# Patient Record
Sex: Male | Born: 1965 | Race: Black or African American | Hispanic: No | Marital: Single | Smoking: Never smoker
Health system: Southern US, Community
[De-identification: ages and names within clinical notes are randomized; demographics above are authoritative.]

## PROBLEM LIST (undated history)

## (undated) DIAGNOSIS — F09 Unspecified mental disorder due to known physiological condition: Secondary | ICD-10-CM

## (undated) DIAGNOSIS — M24159 Other articular cartilage disorders, unspecified hip: Secondary | ICD-10-CM

## (undated) DIAGNOSIS — I10 Essential (primary) hypertension: Secondary | ICD-10-CM

## (undated) DIAGNOSIS — E1165 Type 2 diabetes mellitus with hyperglycemia: Secondary | ICD-10-CM

## (undated) DIAGNOSIS — E785 Hyperlipidemia, unspecified: Secondary | ICD-10-CM

## (undated) DIAGNOSIS — M79604 Pain in right leg: Secondary | ICD-10-CM

## (undated) DIAGNOSIS — I6782 Cerebral ischemia: Secondary | ICD-10-CM

## (undated) DIAGNOSIS — M4316 Spondylolisthesis, lumbar region: Secondary | ICD-10-CM

## (undated) DIAGNOSIS — E871 Hypo-osmolality and hyponatremia: Secondary | ICD-10-CM

## (undated) DIAGNOSIS — R5383 Other fatigue: Secondary | ICD-10-CM

## (undated) DIAGNOSIS — Q659 Congenital deformity of hip, unspecified: Secondary | ICD-10-CM

## (undated) DIAGNOSIS — E119 Type 2 diabetes mellitus without complications: Secondary | ICD-10-CM

## (undated) DIAGNOSIS — D649 Anemia, unspecified: Secondary | ICD-10-CM

## (undated) DIAGNOSIS — R296 Repeated falls: Secondary | ICD-10-CM

## (undated) DIAGNOSIS — M5030 Other cervical disc degeneration, unspecified cervical region: Secondary | ICD-10-CM

## (undated) DIAGNOSIS — M25562 Pain in left knee: Secondary | ICD-10-CM

## (undated) HISTORY — DX: Type 2 diabetes mellitus with hyperglycemia: E11.65

## (undated) HISTORY — DX: Hypo-osmolality and hyponatremia: E87.1

## (undated) HISTORY — DX: Spondylolisthesis, lumbar region: M43.16

## (undated) HISTORY — DX: Repeated falls: R29.6

## (undated) HISTORY — DX: Pain in right leg: M79.604

## (undated) HISTORY — DX: Other cervical disc degeneration, unspecified cervical region: M50.30

## (undated) HISTORY — DX: Anemia, unspecified: D64.9

## (undated) HISTORY — DX: Type 2 diabetes mellitus without complications: E11.9

## (undated) HISTORY — DX: Pain in left knee: M25.562

## (undated) HISTORY — DX: Congenital deformity of hip, unspecified: Q65.9

## (undated) HISTORY — DX: Unspecified mental disorder due to known physiological condition: F09

## (undated) HISTORY — DX: Other fatigue: R53.83

## (undated) HISTORY — DX: Cerebral ischemia: I67.82

## (undated) HISTORY — DX: Other articular cartilage disorders, unspecified hip: M24.159

## (undated) HISTORY — DX: Essential (primary) hypertension: I10

## (undated) HISTORY — DX: Hyperlipidemia, unspecified: E78.5

## (undated) HISTORY — DX: Hypocalcemia: E83.51

---
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
## 1997-10-26 ENCOUNTER — Ambulatory Visit (HOSPITAL_COMMUNITY): Admission: RE | Admit: 1997-10-26 | Discharge: 1997-10-26 | Payer: Self-pay | Admitting: Family Medicine

## 1997-10-29 ENCOUNTER — Ambulatory Visit (HOSPITAL_COMMUNITY): Admission: RE | Admit: 1997-10-29 | Discharge: 1997-10-29 | Payer: Self-pay | Admitting: Family Medicine

## 2009-03-26 ENCOUNTER — Emergency Department (HOSPITAL_COMMUNITY): Admission: EM | Admit: 2009-03-26 | Discharge: 2009-03-26 | Payer: Self-pay | Admitting: Family Medicine

## 2012-04-14 ENCOUNTER — Encounter: Payer: Self-pay | Admitting: Family Medicine

## 2012-04-14 ENCOUNTER — Ambulatory Visit (INDEPENDENT_AMBULATORY_CARE_PROVIDER_SITE_OTHER): Payer: 59 | Admitting: Family Medicine

## 2012-04-14 VITALS — BP 154/105 | HR 125 | Temp 98.3°F | Resp 20 | Ht 67.75 in | Wt 213.6 lb

## 2012-04-14 DIAGNOSIS — E119 Type 2 diabetes mellitus without complications: Secondary | ICD-10-CM

## 2012-04-14 DIAGNOSIS — J322 Chronic ethmoidal sinusitis: Secondary | ICD-10-CM

## 2012-04-14 DIAGNOSIS — I10 Essential (primary) hypertension: Secondary | ICD-10-CM

## 2012-04-14 DIAGNOSIS — J209 Acute bronchitis, unspecified: Secondary | ICD-10-CM

## 2012-04-14 LAB — POCT CBC
Granulocyte percent: 46.7 %G (ref 37–80)
HCT, POC: 43 % — AB (ref 43.5–53.7)
MCH, POC: 26.7 pg — AB (ref 27–31.2)
MCHC: 31.6 g/dL — AB (ref 31.8–35.4)
MCV: 84.3 fL (ref 80–97)
MID (cbc): 0.5 (ref 0–0.9)
POC Granulocyte: 3 (ref 2–6.9)
POC LYMPH PERCENT: 46.2 %L (ref 10–50)

## 2012-04-14 MED ORDER — METOPROLOL SUCCINATE ER 50 MG PO TB24: ORAL_TABLET | ORAL | Status: DC

## 2012-04-14 MED ORDER — AMOXICILLIN 500 MG PO CAPS: 1000.0000 mg | ORAL_CAPSULE | Freq: Two times a day (BID) | ORAL | Status: DC

## 2012-04-14 MED ORDER — METFORMIN HCL 500 MG PO TABS: 500.0000 mg | ORAL_TABLET | Freq: Two times a day (BID) | ORAL | Status: DC

## 2012-04-14 NOTE — Progress Notes (Signed)
Subjective:    Patient ID: Shawn Austin, male    DOB: 11/23/1965, 47 y.o.   MRN: [id number]  HPI This 47 y.o. male presents for evaluation of the following:  1.  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  Ate peppermint and chewing gum today; no food; drank water.  Rare desserts. Does drink a lot of regular sodas; sweet tea is common and regular.  90% of most of fluid is sweet.  Fruit juice.  Putting together gym in garage.  Absolutely does not want to take medication; started back walking.  Started suspecting sugar was elevated due to nocturia.  Nocturia x 3.  Plays golf.  B:  [employer] eat chicken fila biscuit and steak biscuit, fries, sweet tea or lemonade.  Snack: banana, granola bar, applesauce.  Boost high protein.   Lunch:  Chicken, fries, sweet tea.  Snack:  None.  Supper:  Popcorn with son.  Green beans.  Wants prime rib at [employer].  No family history of DMII.  2.  HTN:  No previous diagnosis.  Denies chest pain, palpitations, shortness of breath, leg swelling. +HA with sinus infection; no dizziness, blurred vision, focal weakness, paresthesias.  Very resistant to take medication.    3.  Sinus congestion:  Onset two weeks ago; son had a cold; +fever with onset.  +ear itching.  No ear pain; no sore throat.  +rhinorrhea; +nasal congestion clear.  +PND a lot but improved.  No coughing.  +hoarseness; +gland swollen.  Taking nothing for symptoms.  Bought saline solution nasal spray PRN.  Had a headache from nasal spray so stopped it.  Bloody drainage in mornings.   Review of Systems  Constitutional: Negative for fever, chills, diaphoresis and fatigue.  HENT: Positive for congestion, rhinorrhea, postnasal drip and sinus pressure. Negative for ear pain, sore throat, trouble swallowing and voice change.   Respiratory: Negative for cough and shortness of breath.   Cardiovascular: Negative for chest pain and palpitations.   Gastrointestinal: Negative for nausea, vomiting, abdominal pain and diarrhea.  Endocrine: Positive for polyuria. Negative for cold intolerance, heat intolerance, polydipsia and polyphagia.  Skin: Negative for color change, pallor, rash and wound.  Neurological: Positive for headaches. Negative for dizziness, tremors, seizures, syncope, facial asymmetry, speech difficulty, weakness, light-headedness and numbness.        History reviewed. No pertinent past medical history.  History reviewed. No pertinent past surgical history.  Prior to Admission medications   Medication Sig Start Date End Date Taking? Authorizing Provider  AMINO ACIDS COMPLEX PO Take by mouth.   Yes Historical Provider, MD  CINNAMON PO Take by mouth.   Yes Historical Provider, MD  GARLIC 1500 PO Take by mouth.   Yes Historical Provider, MD  amoxicillin (AMOXIL) 500 MG capsule Take 2 capsules (1,000 mg total) by mouth 2 (two) times daily. 04/14/12   Ethelda Chick, MD  metFORMIN (GLUCOPHAGE) 500 MG tablet Take 1 tablet (500 mg total) by mouth 2 (two) times daily with a meal. 04/14/12   Ethelda Chick, MD  metoprolol succinate (TOPROL-XL) 50 MG 24 hr tablet 1/2 tablet daily for one week; increase to 1 tablet daily.  Take with or immediately following a meal. 04/14/12   Ethelda Chick, MD    No Known Allergies  History   Social History  . Marital Status: Single    Spouse Name: N/A    Number of Children: N/A  .  Years of Education: N/A   Occupational History  . Not on file.   Social History Main Topics  . Smoking status: Never Smoker   . Smokeless tobacco: Not on file  . Alcohol Use: No  . Drug Use: No  . Sexually Active: Not on file   Other Topics Concern  . Not on file   Social History Narrative  . No narrative on file    No family history on file.  Objective:   Physical Exam  Nursing note and vitals reviewed. Constitutional: He is oriented to person, place, and time. He appears well-developed and  well-nourished. No distress.  HENT:  Head: Normocephalic and atraumatic.  Right Ear: External ear normal.  Left Ear: External ear normal.  Nose: Mucosal edema and rhinorrhea present. Right sinus exhibits maxillary sinus tenderness and frontal sinus tenderness. Left sinus exhibits maxillary sinus tenderness and frontal sinus tenderness.  Mouth/Throat: Oropharynx is clear and moist.  Eyes: Conjunctivae and EOM are normal. Pupils are equal, round, and reactive to light.  Neck: Normal range of motion. Neck supple. No thyromegaly present.  Cardiovascular: Normal rate, regular rhythm, normal heart sounds and intact distal pulses.  Exam reveals no gallop and no friction rub.   No murmur heard. Pulmonary/Chest: Effort normal and breath sounds normal. He has no wheezes. He has no rales.  Abdominal: Soft. Bowel sounds are normal. He exhibits no distension. There is no tenderness. There is no rebound and no guarding. Hernia confirmed negative in the right inguinal area and confirmed negative in the left inguinal area.  Genitourinary: Penis normal. Right testis shows no mass, no swelling and no tenderness. Left testis shows no mass, no swelling and no tenderness.  Lymphadenopathy:    He has cervical adenopathy.       Right: No inguinal adenopathy present.       Left: No inguinal adenopathy present.  Neurological: He is alert and oriented to person, place, and time. He has normal reflexes. No cranial nerve deficit. He exhibits normal muscle tone. Coordination normal.  Skin: Skin is warm and dry. No rash noted. He is not diaphoretic. No erythema. No pallor.  Psychiatric: He has a normal mood and affect. His behavior is normal. Judgment and thought content normal.   Results for orders placed in visit on 04/14/12  POCT CBC      Result Value Range   WBC 6.5  4.6 - 10.2 K/uL   Lymph, poc 3.0  0.6 - 3.4   POC LYMPH PERCENT 46.2  10 - 50 %L   MID (cbc) 0.5  0 - 0.9   POC MID % 7.1  0 - 12 %M   POC  Granulocyte 3.0  2 - 6.9   Granulocyte percent 46.7  37 - 80 %G   RBC 5.10  4.69 - 6.13 M/uL   Hemoglobin 13.6 (*) 14.1 - 18.1 g/dL   HCT, POC 19.1 (*) 47.8 - 53.7 %   MCV 84.3  80 - 97 fL   MCH, POC 26.7 (*) 27 - 31.2 pg   MCHC 31.6 (*) 31.8 - 35.4 g/dL   RDW, POC 29.5     Platelet Count, POC 466 (*) 142 - 424 K/uL   MPV 8.5  0 - 99.8 fL       Assessment & Plan:  Diabetes mellitus - Plan: Lipid panel  Essential hypertension, benign - Plan: Comprehensive metabolic panel, TSH  Ethmoid sinusitis - Plan: POCT CBC    1. DMII: New onset; rx for  Metformin 500mg  bid provided; dietary changes reviewed in great detail; recommend weight loss, exercise, low-carb food choices.  Refer for diabetic education.  Follow-up in one month; provided with three month DOT card. 2.  HTN:  New onset; rx for Metoprolol ER 50mg  one daily. Obtain labs.  F/u one month for follow-up and EKG.  Provided with three month DOT card. 3.  Acute Sinusitis:  New. Rx for Amoxicillin provided.     Meds ordered this encounter  Medications  . AMINO ACIDS COMPLEX PO    Sig: Take by mouth.  Marland Kitchen CINNAMON PO    Sig: Take by mouth.  Marland Kitchen GARLIC 1500 PO    Sig: Take by mouth.  . metFORMIN (GLUCOPHAGE) 500 MG tablet    Sig: Take 1 tablet (500 mg total) by mouth 2 (two) times daily with a meal.    Dispense:  60 tablet    Refill:  3  . metoprolol succinate (TOPROL-XL) 50 MG 24 hr tablet    Sig: 1/2 tablet daily for one week; increase to 1 tablet daily.  Take with or immediately following a meal.    Dispense:  30 tablet    Refill:  3  . amoxicillin (AMOXIL) 500 MG capsule    Sig: Take 2 capsules (1,000 mg total) by mouth 2 (two) times daily.    Dispense:  40 capsule    Refill:  0

## 2012-04-14 NOTE — Patient Instructions (Addendum)
Diabetes mellitus - Plan: Lipid panel  Essential hypertension, benign - Plan: Comprehensive metabolic panel, TSH  Ethmoid sinusitis - Plan: POCT CBC   1.  Follow-up in one month on Thursday night with Dr. Nilda Simmer 2.  AVOID ALL SWEETENED BEVERAGES SUCH AS SWEET TEA, FRUIT JUICE, REGULAR SODAS. 3.  EAT PROTEIN WITH EACH MEAL OR SNACK.  PROTEIN SOURCES:  MEATS, CHEESE, NUTS. 4.  EXERCISE FOUR DAYS PER WEEK. 5.  TAKE METFORMIN TWICE DAILY WITH MEALS. 6.  TAKE METOPROLOL AT NIGHTTIME.

## 2012-04-15 LAB — COMPREHENSIVE METABOLIC PANEL
ALT: 16 U/L (ref 0–53)
BUN: 8 mg/dL (ref 6–23)
CO2: 26 mEq/L (ref 19–32)
Calcium: 9.9 mg/dL (ref 8.4–10.5)
Chloride: 100 mEq/L (ref 96–112)
Creat: 0.8 mg/dL (ref 0.50–1.35)
Glucose, Bld: 269 mg/dL — ABNORMAL HIGH (ref 70–99)
Total Bilirubin: 0.6 mg/dL (ref 0.3–1.2)

## 2012-04-15 LAB — LIPID PANEL
Cholesterol: 200 mg/dL (ref 0–200)
HDL: 42 mg/dL (ref >39)
Total CHOL/HDL Ratio: 4.8 Ratio
Triglycerides: 195 mg/dL — ABNORMAL HIGH (ref <150)

## 2012-04-15 LAB — TSH: TSH: 0.984 u[IU]/mL (ref 0.350–4.500)

## 2012-05-11 ENCOUNTER — Ambulatory Visit: Payer: Self-pay | Admitting: Dietician

## 2012-05-19 ENCOUNTER — Ambulatory Visit (INDEPENDENT_AMBULATORY_CARE_PROVIDER_SITE_OTHER): Payer: 59 | Admitting: Family Medicine

## 2012-05-19 VITALS — BP 149/100 | HR 103 | Temp 98.2°F | Resp 16 | Ht 68.0 in | Wt 209.0 lb

## 2012-05-19 DIAGNOSIS — K429 Umbilical hernia without obstruction or gangrene: Secondary | ICD-10-CM

## 2012-05-19 DIAGNOSIS — D649 Anemia, unspecified: Secondary | ICD-10-CM

## 2012-05-19 DIAGNOSIS — E119 Type 2 diabetes mellitus without complications: Secondary | ICD-10-CM

## 2012-05-19 DIAGNOSIS — IMO0001 Reserved for inherently not codable concepts without codable children: Secondary | ICD-10-CM

## 2012-05-19 DIAGNOSIS — I10 Essential (primary) hypertension: Secondary | ICD-10-CM

## 2012-05-19 LAB — POCT CBC
HCT, POC: 41 % — AB (ref 43.5–53.7)
Lymph, poc: 3.3 (ref 0.6–3.4)
MCHC: 32 g/dL (ref 31.8–35.4)
MID (cbc): 0.4 (ref 0–0.9)
POC Granulocyte: 2.3 (ref 2–6.9)
POC LYMPH PERCENT: 54.9 %L — AB (ref 10–50)
POC MID %: 7.4 %M (ref 0–12)
RDW, POC: 13.1 %

## 2012-05-19 NOTE — Patient Instructions (Addendum)
Type II or unspecified type diabetes mellitus without mention of complication, uncontrolled - Plan: POCT glucose (manual entry), POCT urinalysis dipstick, Comprehensive metabolic panel  Essential hypertension, benign - Plan: POCT CBC, EKG 12-Lead  Umbilical hernia

## 2012-05-19 NOTE — Progress Notes (Signed)
[address]   [place], [place]  [address]   [phone]  Subjective:    Patient ID: Shawn Austin, male    DOB: [birth year], 47 y.o.   MRN: [id number]  HPI This 47 y.o. male presents for one month follow-up:  1.  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   Did not take Metoprolol but 2 days; started walking 2-3 miles daily since last visit.  Four pound weight loss.  Feeling well. Denies HA, focal weakness, dizziness, chest pain, palpitations, SOB, leg swelling.    2.  DMII: took Metformin for two days; caused abdominal pain and diarrhea.  Metformin is not going to work for patient.  Metformin made pt feel badly.  Four pound weight loss since last visit.  Cut out the sweet tea but had to gradually cut it out.  Employer took out of work last week due to elevated blood sugars and blood pressure.  3.  Umbilical Hernia:  Needs statement saying that patient has not restrictions.     Review of Systems  Constitutional: Negative for fever, chills, diaphoresis and fatigue.  Respiratory: Negative for shortness of breath.   Cardiovascular: Negative for chest pain, palpitations and leg swelling.  Gastrointestinal: Negative for nausea, vomiting, abdominal pain, diarrhea and constipation.  Neurological: Negative for dizziness, tremors, seizures, syncope, facial asymmetry, speech difficulty, weakness, light-headedness, numbness and headaches.    History reviewed. No pertinent past medical history.  History reviewed. No pertinent past surgical history.  Prior to Admission medications   Medication Sig Start Date End Date Taking? Authorizing Provider  AMINO ACIDS COMPLEX PO Take by mouth.    Historical Provider, MD  CINNAMON PO Take by mouth.    Historical Provider, MD  GARLIC 1500 PO Take by mouth.    Historical Provider, MD  metFORMIN (GLUCOPHAGE) 500 MG tablet Take 1  tablet (500 mg total) by mouth 2 (two) times daily with a meal. 04/14/12   Ethelda Chick, MD  metoprolol succinate (TOPROL-XL) 50 MG 24 hr tablet 1/2 tablet daily for one week; increase to 1 tablet daily.  Take with or immediately following a meal. 04/14/12   Ethelda Chick, MD    No Known Allergies  History   Social History  . Marital Status: Single    Spouse Name: N/A    Number of Children: N/A  . Years of Education: N/A   Occupational History  . Not on file.   Social History Main Topics  . Smoking status: Never Smoker   . Smokeless tobacco: Not on file  . Alcohol Use: No  . Drug Use: No  . Sexually Active: Not Currently   Other Topics Concern  . Not on file   Social History Narrative  . No narrative on file    History reviewed. No pertinent family history.     Objective:   Physical Exam  Nursing note and vitals reviewed. Constitutional: He is oriented to person, place, and time. He appears well-developed and well-nourished. No distress.  Eyes: Conjunctivae are normal.  Neck: Normal range of motion. Neck supple. No thyromegaly present.  Cardiovascular: Normal rate, regular rhythm and normal heart sounds.  Exam reveals no friction rub.   No murmur heard. Pulmonary/Chest: Effort normal and breath sounds normal. He has no wheezes. He has no rales.  Abdominal: Soft. Bowel sounds are normal. A hernia is present.  Umbilical  hernia moderate in size.  Neurological: He is alert and oriented to person, place, and time.  Skin: He is not diaphoretic.    REPEAT BP IN OFFICE:  160/108      Results for orders placed in visit on 05/19/12  GLUCOSE, POCT (MANUAL RESULT ENTRY)      Result Value Range   POC Glucose 152 (*) 70 - 99 mg/dl  POCT CBC      Result Value Range   WBC 6.0  4.6 - 10.2 K/uL   Lymph, poc 3.3  0.6 - 3.4   POC LYMPH PERCENT 54.9 (*) 10 - 50 %L   MID (cbc) 0.4  0 - 0.9   POC MID % 7.4  0 - 12 %M   POC Granulocyte 2.3  2 - 6.9   Granulocyte percent 37.7   37 - 80 %G   RBC 4.92  4.69 - 6.13 M/uL   Hemoglobin 13.1 (*) 14.1 - 18.1 g/dL   HCT, POC 16.1 (*) 09.6 - 53.7 %   MCV 83.3  80 - 97 fL   MCH, POC 26.6 (*) 27 - 31.2 pg   MCHC 32.0  31.8 - 35.4 g/dL   RDW, POC 04.5     Platelet Count, POC 415  142 - 424 K/uL   MPV 8.3  0 - 99.8 fL  EKG 12-LEAD      Result Value Range   P001 Ascaris        Assessment & Plan:  Type II or unspecified type diabetes mellitus without mention of complication, uncontrolled - Plan: POCT glucose (manual entry), Comprehensive metabolic panel, CANCELED: POCT urinalysis dipstick  Essential hypertension, benign - Plan: POCT CBC, EKG 12-Lead  Umbilical hernia   1.  DMII: improved with current blood sugar of 158.  Non-compliant with Metformin but dietary modification with significant improvement in sugars.  Currently has three month DOT card. 2.  HTN: uncontrolled yet appears to have white coat syndrome component. Diastolic readings at home still elevated.  Refuses to take Metoprolol at this time. Weight down four pounds; has started exercising daily.  Desires close follow-up; will also provide information to employer that cleared to work.  Currently has three month card DOT. 3. Umbilical Hernia: small to moderate in size; asymptomatic; cleared to work. 4. Anemia: mild and stable.

## 2012-05-20 LAB — COMPREHENSIVE METABOLIC PANEL
Alkaline Phosphatase: 90 U/L (ref 39–117)
Glucose, Bld: 155 mg/dL — ABNORMAL HIGH (ref 70–99)
Sodium: 136 mEq/L (ref 135–145)
Total Bilirubin: 1 mg/dL (ref 0.3–1.2)
Total Protein: 7.6 g/dL (ref 6.0–8.3)

## 2012-05-20 NOTE — Progress Notes (Signed)
Appt made for 4/23 at 4:30 with Dr. Katrinka Blazing. Left vmail for pt with appt info.

## 2012-05-24 ENCOUNTER — Telehealth: Payer: Self-pay

## 2012-05-24 NOTE — Telephone Encounter (Signed)
PATIENT WANTS TO ASK DR. Katrinka Blazing IF THE PAPERS HE DROPPED OFF TO HER ON THURS. HAS BEEN FAXED TO HIS EMPLOYER'S MEDICAL DEPT? THEY SAID THEY HAVE NOT RECEIVED ANYTHING FROM [place]. BEST PHONE [phone] (CELL)  HE SAID THE FAX NUMBER WAS WITH THE PAPER WORK.  MBC

## 2012-05-26 ENCOUNTER — Telehealth: Payer: Self-pay

## 2012-05-26 NOTE — Telephone Encounter (Signed)
PATIENT WANTS DR. Katrinka Blazing TO KNOW THAT HE IS COMING BY TONIGHT TO ASK HER TO FILL OUT HIS WORK RESTRICTION FOR HIS JOB. SAYING THAT HE IS ABLE TO WORK. HE SAYS THIS WAS SUPPOSED TO BE DONE OVER A WEEK AGO. NOT SURE WHAT HE MEANS BY MEDICAL FORMS THAT NEEDED TO BE FILLED OUT IN OUR OFFICE. FOR QUESTIONS CALL: [phone]

## 2012-05-27 NOTE — Telephone Encounter (Signed)
Patient advised left voice mail

## 2012-05-27 NOTE — Telephone Encounter (Signed)
Forms completed and faxed with two recent office visits to case coordinator and [facility].

## 2012-05-27 NOTE — Telephone Encounter (Signed)
Please call pt --- I have faxed medical records to his Case Coordinator and also to [facility].  I will leave a copy of his records at the front desk if he would like to pick a copy up for his personal file.

## 2012-05-27 NOTE — Telephone Encounter (Signed)
Did you get forms?

## 2012-06-08 ENCOUNTER — Encounter: Payer: Self-pay | Admitting: Family Medicine

## 2012-06-08 ENCOUNTER — Ambulatory Visit (INDEPENDENT_AMBULATORY_CARE_PROVIDER_SITE_OTHER): Payer: 59 | Admitting: Family Medicine

## 2012-06-08 VITALS — BP 139/96 | HR 84 | Temp 98.7°F | Resp 18 | Wt 211.0 lb

## 2012-06-08 DIAGNOSIS — K429 Umbilical hernia without obstruction or gangrene: Secondary | ICD-10-CM

## 2012-06-08 DIAGNOSIS — E119 Type 2 diabetes mellitus without complications: Secondary | ICD-10-CM

## 2012-06-08 DIAGNOSIS — I10 Essential (primary) hypertension: Secondary | ICD-10-CM

## 2012-06-08 LAB — POCT URINALYSIS DIPSTICK
Bilirubin, UA: NEGATIVE
Glucose, UA: NEGATIVE
Leukocytes, UA: NEGATIVE
Nitrite, UA: NEGATIVE

## 2012-06-08 LAB — GLUCOSE, POCT (MANUAL RESULT ENTRY): POC Glucose: 214 mg/dl — AB (ref 70–99)

## 2012-06-08 NOTE — Patient Instructions (Addendum)
1. RETURN ON Jun 27, 2012 AT 4:30.

## 2012-06-08 NOTE — Progress Notes (Signed)
[address]   [place], [place]  [address]   [phone]  Subjective:    Patient ID: Shawn Austin, male    DOB: [birth year], 47 y.o.   MRN: [id number]  HPI This 47 y.o. male presents for three week follow-up/evaluation of the following:  1.  HTN:  Three week follow-up for elevated blood pressures.  Started Metoprolol one week ago; no side effects; taking Metoprolol ER 50mg  1/2 tablet daily.  Since starting Metoprolol, still having BP 115-130/80-96.  Walking 3 miles per day.  Diastolics lower in evenings.  In mornings, BP in 90-100s.  Taking Metoprolol during the daytime.  Denies chest pain, palpitations, shortness of breath, leg swelling.  No headaches, vision changes, dizziness, numbness, or tingling; no weakness.  Energy level is good.  2.  DMII:  Three week follow-up; no changes to management at last visit.  Referred to diabetic education/nutrition consultation; unable to attend; no glucometer at this time.  Only eating greens/vegetables, low-carb food choices.. No sweet tea. B:  Yogurt, almonds, supplements, water, unsweetened tea, raisins.  Other breakfast:  banana, oatmeal, metamucil, peanut butter crackers, eggs, linked sausage.   Lunch:  Apple, banana sandwich, steak n cheese sandwich, pizza 2-3 slices, water and lemonade.  Ham and [place] sandwiches.  Snack:  Popcorn, popsicle, watermelon.  Supper:  Cracker barrel pinto beans, onions, chow-chow, grilled roast beef, salad with ranch, unsweetened tea.   Tried Metformin again; caused diarrhea; usually takes in morning with light breakfast.  Nocturia x 1 now.  Some nights, no nocturia.  Just ate lunch before visit today.    Review of Systems  Constitutional: Negative for fever, chills, diaphoresis and fatigue.  Eyes: Negative for photophobia and visual disturbance.  Respiratory: Negative for cough, shortness of breath, wheezing and stridor.   Cardiovascular: Negative for chest pain, palpitations and leg swelling.  Gastrointestinal:  Negative for nausea, vomiting and diarrhea.  Endocrine: Negative for cold intolerance, heat intolerance, polydipsia, polyphagia and polyuria.  Neurological: Negative for dizziness, tremors, seizures, syncope, facial asymmetry, speech difficulty, weakness, light-headedness, numbness and headaches.        Past Medical History  Diagnosis Date  . Hypertension   . Diabetes mellitus without complication     History reviewed. No pertinent past surgical history.  Prior to Admission medications   Medication Sig Start Date End Date Taking? Authorizing Provider  AMINO ACIDS COMPLEX PO Take by mouth.   Yes Historical Provider, MD  CINNAMON PO Take by mouth.   Yes Historical Provider, MD  GARLIC 1500 PO Take by mouth.   Yes Historical Provider, MD  metFORMIN (GLUCOPHAGE) 500 MG tablet Take 1 tablet (500 mg total) by mouth 2 (two) times daily with a meal. 04/14/12  Yes Ethelda Chick, MD  metoprolol succinate (TOPROL-XL) 50 MG 24 hr tablet 1/2 tablet daily for one week; increase to 1 tablet daily.  Take with or immediately following a meal. 04/14/12  Yes Ethelda Chick, MD    No Known Allergies  History   Social History  . Marital Status: Single    Spouse Name: N/A    Number of Children: N/A  . Years of Education: N/A   Occupational History  . Not on file.   Social History Main Topics  . Smoking status: Never Smoker   . Smokeless tobacco: Not on file  . Alcohol Use: No  . Drug Use: No  . Sexually Active: Not Currently   Other Topics Concern  . Not on file  Social History Narrative  . No narrative on file    History reviewed. No pertinent family history.  Objective:   Physical Exam  Nursing note and vitals reviewed. Constitutional: He is oriented to person, place, and time. He appears well-developed and well-nourished. No distress.  HENT:  Mouth/Throat: Oropharynx is clear and moist.  Eyes: Conjunctivae and EOM are normal. Pupils are equal, round, and reactive to light.    Neck: Normal range of motion. Neck supple. No thyromegaly present.  Cardiovascular: Normal rate, regular rhythm, normal heart sounds and intact distal pulses.  Exam reveals no gallop and no friction rub.   No murmur heard. Pulmonary/Chest: Effort normal and breath sounds normal. No respiratory distress. He has no wheezes. He has no rales.  Abdominal: Soft. Bowel sounds are normal. He exhibits no distension and no mass. There is no tenderness. There is no rebound and no guarding. A hernia is present.  +umbilical hernia non-tender.  Lymphadenopathy:    He has no cervical adenopathy.  Neurological: He is alert and oriented to person, place, and time. No cranial nerve deficit. He exhibits normal muscle tone.  Skin: He is not diaphoretic.  Psychiatric: He has a normal mood and affect. His behavior is normal. Judgment and thought content normal.          Results for orders placed in visit on 06/08/12  GLUCOSE, POCT (MANUAL RESULT ENTRY)      Result Value Range   POC Glucose 214 (*) 70 - 99 mg/dl  POCT URINALYSIS DIPSTICK      Result Value Range   Color, UA yellow     Clarity, UA clear     Glucose, UA neg     Bilirubin, UA neg     Ketones, UA 40     Spec Grav, UA <=1.005     Blood, UA neg     pH, UA 6.5     Protein, UA neg     Urobilinogen, UA 0.2     Nitrite, UA neg     Leukocytes, UA Negative      Assessment & Plan:  Type II or unspecified type diabetes mellitus without mention of complication, not stated as uncontrolled - Plan: POCT glucose (manual entry), POCT urinalysis dipstick  Essential hypertension, benign  Umbilical hernia   1. DMII:  Stable/improved control with dietary modification.  Cleared for work/employment/driving.  Change Metformin to once after largest meal of day. Check sugar twice daily.  No restrictions.  2.  HTN: improving; increase Metoprolol ER 50mg  to one tablet daily.  Asymptomatic. Cleared for employment/work/driving.  No restrictions. 3.   Umbilical Hernia: stable; asymptomatic; cleared for work/driving/employment. No restrictions.

## 2012-06-09 NOTE — Progress Notes (Signed)
Emailed Dr. 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

## 2012-06-13 NOTE — Progress Notes (Signed)
Appts for 5/12 and 5/27 made, left voicemail at pt home with appt details.

## 2012-06-27 ENCOUNTER — Encounter: Payer: Self-pay | Admitting: Family Medicine

## 2012-06-27 ENCOUNTER — Ambulatory Visit (INDEPENDENT_AMBULATORY_CARE_PROVIDER_SITE_OTHER): Payer: 59 | Admitting: Family Medicine

## 2012-06-27 VITALS — BP 136/100 | HR 118 | Temp 98.0°F | Resp 16 | Ht 68.0 in | Wt 203.0 lb

## 2012-06-27 DIAGNOSIS — I10 Essential (primary) hypertension: Secondary | ICD-10-CM

## 2012-06-27 DIAGNOSIS — E119 Type 2 diabetes mellitus without complications: Secondary | ICD-10-CM

## 2012-06-27 DIAGNOSIS — IMO0001 Reserved for inherently not codable concepts without codable children: Secondary | ICD-10-CM

## 2012-06-27 LAB — GLUCOSE, POCT (MANUAL RESULT ENTRY): POC Glucose: 296 mg/dl — AB (ref 70–99)

## 2012-06-27 LAB — POCT GLYCOSYLATED HEMOGLOBIN (HGB A1C): Hemoglobin A1C: 11.2

## 2012-06-27 NOTE — Patient Instructions (Addendum)
1.  Take Metoprolol ER 50mg  one daily. 2.  Take Metformin 500mg  one pill twice daily.

## 2012-06-27 NOTE — Progress Notes (Signed)
[address]   [place], [place]  [address]   [id number]  Subjective:    Patient ID: Shawn Austin, male    DOB: 1965-09-30, 47 y.o.   MRN: [id number]  HPI This 47 y.o. male presents for two week follow-up:  1.  HTN: taking Metoprolol ER 50mg  one every other day.  Home BP before visit 130/84. Last Metoprolol yesterday evening.  Lowest blood pressure reading running 144/90 pulse 100; 126/88 pulse 94 yesterday; 126/97 pulse 97.  124/95 pulse 88.  136/100 pulse 110; 115/82. Feels well; no side effects to Metoprolol.  Denies chest pain, palpitations, SOB, leg swelling. Exercising daily; has drastically changed diet in past three months.  2.  DMII: taking one Metformin 500mg  one after meal every other day.  Has glucometer; sugars running fasting 167, 133, 120.  After meals sugar 267-160s post-prandials.  Eaten today; ate one hour ago; ate roast beef, green beans, salad with ranch dressing, water.  Ate pie today. Also ate almonds.  Card expires 07/12/12.  Last Metformin two days ago.   Review of Systems  Constitutional: Negative for fever, chills, diaphoresis and fatigue.  Respiratory: Negative for shortness of breath, wheezing and stridor.   Cardiovascular: Negative for chest pain, palpitations and leg swelling.  Endocrine: Negative for cold intolerance, heat intolerance, polydipsia, polyphagia and polyuria.  Skin: Negative for color change, pallor, rash and wound.  Neurological: Negative for dizziness, tremors, seizures, syncope, facial asymmetry, speech difficulty, weakness, light-headedness, numbness and headaches.    Past Medical History  Diagnosis Date  . Hypertension   . Diabetes mellitus without complication     History reviewed. No pertinent past surgical history.  Prior to Admission medications   Medication Sig Start Date End Date Taking? Authorizing Provider  AMINO ACIDS COMPLEX PO Take by mouth.   Yes Historical Provider, MD  CINNAMON PO Take by mouth.   Yes Historical  Provider, MD  GARLIC 1500 PO Take by mouth.   Yes Historical Provider, MD  metFORMIN (GLUCOPHAGE) 500 MG tablet Take 1 tablet (500 mg total) by mouth 2 (two) times daily with a meal. 04/14/12  Yes Ethelda Chick, MD  metoprolol succinate (TOPROL-XL) 50 MG 24 hr tablet 1/2 tablet daily for one week; increase to 1 tablet daily.  Take with or immediately following a meal. 04/14/12  Yes Ethelda Chick, MD    No Known Allergies  History   Social History  . Marital Status: Single    Spouse Name: N/A    Number of Children: N/A  . Years of Education: N/A   Occupational History  . Not on file.   Social History Main Topics  . Smoking status: Never Smoker   . Smokeless tobacco: Not on file  . Alcohol Use: No  . Drug Use: No  . Sexually Active: Not Currently   Other Topics Concern  . Not on file   Social History Narrative  . No narrative on file    History reviewed. No pertinent family history.     Objective:   Physical Exam  Nursing note and vitals reviewed. Constitutional: He is oriented to person, place, and time. He appears well-developed and well-nourished. No distress.  HENT:  Head: Normocephalic and atraumatic.  Mouth/Throat: Oropharynx is clear and moist.  Eyes: Conjunctivae and EOM are normal. Pupils are equal, round, and reactive to light.  Neck: Normal range of motion. Neck supple. No JVD present. No thyromegaly present.  Cardiovascular: Normal rate, regular rhythm, normal heart sounds and  intact distal pulses.  Exam reveals no gallop and no friction rub.   No murmur heard. Pulmonary/Chest: Effort normal and breath sounds normal. He has no wheezes. He has no rales.  Lymphadenopathy:    He has no cervical adenopathy.  Neurological: He is alert and oriented to person, place, and time. No cranial nerve deficit. He exhibits normal muscle tone. Coordination normal.  Skin: Skin is warm and dry. No rash noted. He is not diaphoretic.  Psychiatric: He has a normal mood and  affect. His behavior is normal.       Results for orders placed in visit on 06/27/12  GLUCOSE, POCT (MANUAL RESULT ENTRY)      Result Value Range   POC Glucose 296 (*) 70 - 99 mg/dl  POCT GLYCOSYLATED HEMOGLOBIN (HGB A1C)      Result Value Range   Hemoglobin A1C 11.2      Assessment & Plan:  Type II or unspecified type diabetes mellitus without mention of complication, uncontrolled - Plan: POCT glucose (manual entry), POCT glycosylated hemoglobin (Hb A1C)  Essential hypertension, benign   1.  DMII: uncontrolled due to non-compliance with medication; discussed that sugars must greatly improve to receive extension of DOT card in two weeks; advised to start Metformin 500mg  once daily and increase to bid if tolerated.   2.  HTN: uncontrolled due to non-compliance with medication; advised to start Metoprolol ER daily once; emphasized that BP<140/90 for clearance in two weeks.  No orders of the defined types were placed in this encounter.

## 2012-07-12 ENCOUNTER — Ambulatory Visit (INDEPENDENT_AMBULATORY_CARE_PROVIDER_SITE_OTHER): Payer: 59 | Admitting: Family Medicine

## 2012-07-12 ENCOUNTER — Encounter: Payer: Self-pay | Admitting: Family Medicine

## 2012-07-12 VITALS — BP 148/92 | HR 126 | Temp 98.2°F | Resp 16 | Ht 67.75 in | Wt 204.4 lb

## 2012-07-12 DIAGNOSIS — I10 Essential (primary) hypertension: Secondary | ICD-10-CM

## 2012-07-12 DIAGNOSIS — E119 Type 2 diabetes mellitus without complications: Secondary | ICD-10-CM

## 2012-07-12 LAB — GLUCOSE, POCT (MANUAL RESULT ENTRY): POC Glucose: 205 mg/dl — AB (ref 70–99)

## 2012-07-12 NOTE — Progress Notes (Signed)
Subjective:    Patient ID: Shawn Austin, male    DOB: [birth year], 47 y.o.   MRN: [id number]  HPI This 47 y.o. male presents for evaluation two week follow-up:  1.  HTN: taking Metoprolol ER daily. No side effects to it.  Home BP running 119/82; last two days, BP increased to 111/80, 111/82, 145/99, 140/85, 136/92, 137/100.    2.  DMII: taking Metformin 500mg  bid. Sugars running 181 after eating.  Ate an apple.  No fasting reading today.  Loose stools with Metformin; normal is three stools per day.  Having stools in addition to three per day.  Loose stools; not solid stools.  Previously taking one every other day.    Review of Systems  Constitutional: Negative for fever, chills, diaphoresis, activity change, appetite change and fatigue.  Respiratory: Negative for shortness of breath, wheezing and stridor.   Cardiovascular: Negative for chest pain, palpitations and leg swelling.  Gastrointestinal: Negative for nausea, vomiting, abdominal pain, diarrhea, constipation, blood in stool, abdominal distention, anal bleeding and rectal pain.  Endocrine: Negative for cold intolerance, heat intolerance, polydipsia, polyphagia and polyuria.  Skin: Negative for color change, pallor, rash and wound.  Neurological: Negative for dizziness, tremors, seizures, syncope, facial asymmetry, speech difficulty, weakness, light-headedness, numbness and headaches.   Past Medical History  Diagnosis Date  . Hypertension   . Diabetes mellitus without complication    Current Outpatient Prescriptions on File Prior to Visit  Medication Sig Dispense Refill  . AMINO ACIDS COMPLEX PO Take by mouth.      Marland Kitchen CINNAMON PO Take by mouth.      Marland Kitchen GARLIC 1500 PO Take by mouth.      . metFORMIN (GLUCOPHAGE) 500 MG tablet Take 1 tablet (500 mg total) by mouth 2 (two) times daily with a meal.  60 tablet  3  . metoprolol succinate (TOPROL-XL) 50 MG 24 hr tablet 1/2 tablet daily for one week; increase to 1 tablet daily.  Take with  or immediately following a meal.  30 tablet  3   No current facility-administered medications on file prior to visit.       Objective:   Physical Exam  Nursing note and vitals reviewed. Constitutional: He is oriented to person, place, and time. He appears well-developed and well-nourished. No distress.  HENT:  Head: Normocephalic and atraumatic.  Mouth/Throat: Oropharynx is clear and moist.  Eyes: Conjunctivae and EOM are normal. Pupils are equal, round, and reactive to light.  Neck: Normal range of motion. Neck supple. No JVD present. No thyromegaly present.  Cardiovascular: Normal rate, regular rhythm, normal heart sounds and intact distal pulses.  Exam reveals no gallop and no friction rub.   No murmur heard. Pulmonary/Chest: Effort normal and breath sounds normal. He has no wheezes. He has no rales. He exhibits no tenderness.  Lymphadenopathy:    He has no cervical adenopathy.  Neurological: He is alert and oriented to person, place, and time. He has normal reflexes. No cranial nerve deficit. He exhibits normal muscle tone. Coordination normal.  Skin: Skin is warm and dry. No rash noted. He is not diaphoretic. No erythema.  Psychiatric: He has a normal mood and affect. His behavior is normal. Judgment and thought content normal.       Results for orders placed in visit on 07/12/12  GLUCOSE, POCT (MANUAL RESULT ENTRY)      Result Value Range   POC Glucose 205 (*) 70 - 99 mg/dl   Repeat BP 347/42  Assessment & Plan:  Type II or unspecified type diabetes mellitus without mention of complication, not stated as uncontrolled - Plan: POCT glucose (manual entry), CANCELED: POCT glucose (manual entry)  Hypertension  1.  DMII: improved control but blood sugar still not at goal.  Recommend additional three month card to confirm that glycemic control continues. Advised to take Metformin after a meal; increase Metformin to tid. 2. HTN: improving but borderline readings; three month  card additional; increase Metoprolol ER to 1.5 tablets daily.  Home BP cuff consistent with office BP cuff.  No orders of the defined types were placed in this encounter.

## 2012-08-08 ENCOUNTER — Telehealth: Payer: Self-pay

## 2012-08-08 NOTE — Telephone Encounter (Signed)
Pt is requesting a copy of medical records from last office visit with dr Katrinka Blazing, on 07/12/12

## 2012-08-09 NOTE — Telephone Encounter (Signed)
Records ready for pickup. Patient notified. °

## 2012-08-17 ENCOUNTER — Ambulatory Visit: Payer: 59 | Admitting: Family Medicine

## 2012-08-29 ENCOUNTER — Telehealth: Payer: Self-pay

## 2012-08-29 NOTE — Telephone Encounter (Signed)
PT STATES WE HAD SENT SOMETHING TO HIS JOB AND IT WAS NOT ACCEPTABLE. STATES HE NEED [place] TO CALL AND FAX TO [employer] AT [phone] HIS LAST OV NOTES AND HIS AIC I ADVISED PT WE NEED A RELEASE, BUT HE STATED DR Katrinka Blazing KNOW ALL ABOUT THIS AND HE DOESN'T NEED ANYTHING EXCEPT WE TAKE CARE OF THIS TODAY YOU MAY REACH PT AT [phone] IF NEEDED

## 2012-08-29 NOTE — Telephone Encounter (Signed)
Patients records were prepared for him to pick up. I can not fax anything to his job. This is a HIPAA violation, and I will not do this. I called him to advise, if he wants his employer to have this information he will need to provide it. I left message for him to advise.

## 2012-09-01 ENCOUNTER — Encounter: Payer: Self-pay | Admitting: Family Medicine

## 2012-09-07 DIAGNOSIS — E119 Type 2 diabetes mellitus without complications: Secondary | ICD-10-CM | POA: Insufficient documentation

## 2012-09-07 DIAGNOSIS — I10 Essential (primary) hypertension: Secondary | ICD-10-CM | POA: Insufficient documentation

## 2012-09-07 DIAGNOSIS — E1169 Type 2 diabetes mellitus with other specified complication: Secondary | ICD-10-CM | POA: Insufficient documentation

## 2012-09-14 ENCOUNTER — Ambulatory Visit: Payer: 59 | Admitting: Family Medicine

## 2012-10-12 ENCOUNTER — Ambulatory Visit: Payer: 59 | Admitting: Family Medicine

## 2012-12-10 ENCOUNTER — Other Ambulatory Visit: Payer: Self-pay | Admitting: Family Medicine

## 2018-05-26 ENCOUNTER — Encounter: Payer: Self-pay | Admitting: Emergency Medicine

## 2018-05-26 ENCOUNTER — Telehealth: Payer: Self-pay | Admitting: *Deleted

## 2018-05-26 ENCOUNTER — Other Ambulatory Visit: Payer: Self-pay

## 2018-05-26 ENCOUNTER — Telehealth (INDEPENDENT_AMBULATORY_CARE_PROVIDER_SITE_OTHER): Payer: 59 | Admitting: Emergency Medicine

## 2018-05-26 DIAGNOSIS — E1165 Type 2 diabetes mellitus with hyperglycemia: Secondary | ICD-10-CM

## 2018-05-26 DIAGNOSIS — Z9114 Patient's other noncompliance with medication regimen: Secondary | ICD-10-CM

## 2018-05-26 DIAGNOSIS — I10 Essential (primary) hypertension: Secondary | ICD-10-CM | POA: Diagnosis not present

## 2018-05-26 MED ORDER — METFORMIN HCL 500 MG PO TABS
500.0000 mg | ORAL_TABLET | Freq: Two times a day (BID) | ORAL | 3 refills | Status: DC
Start: 2018-05-26 — End: 2021-09-11

## 2018-05-26 MED ORDER — METOPROLOL SUCCINATE ER 50 MG PO TB24: 50.0000 mg | ORAL_TABLET | Freq: Every day | ORAL | 3 refills | Status: DC

## 2018-05-26 NOTE — Progress Notes (Signed)
Spoke to patient for Telemed triage, patient wants to establish care. Patient has diabetes and hypertension. Patient needs medication refill for Metformin and Toprol XL.

## 2018-05-26 NOTE — Progress Notes (Signed)
Lab Results  Component Value Date   HGBA1C 11.2 06/27/2012   BP Readings from Last 3 Encounters:  07/12/12 (!) 148/92  06/27/12 (!) 136/100  06/08/12 (!) 139/96      Telemedicine Encounter- SOAP NOTE Established Patient  This telephone encounter was conducted with the patient's (or proxy's) verbal consent via audio telecommunications: yes/no: Yes Patient was instructed to have this encounter in a suitably private space; and to only have persons present to whom they give permission to participate. In addition, patient identity was confirmed by use of name plus two identifiers (DOB and address).  I discussed the limitations, risks, security and privacy concerns of performing an evaluation and management service by telephone and the availability of in person appointments. I also discussed with the patient that there may be a patient responsible charge related to this service. The patient expressed understanding and agreed to proceed.  I spent a total of TIME; 0 MIN TO 60 MIN: 15 minutes talking with the patient or their proxy.  No chief complaint on file.  Follow-up of diabetes and hypertension. Subjective   Shawn Austin is a 53 y.o. male established patient.  Poorly compliant with visits.  First visit with me.  Telephone visit today for follow-up of diabetes and hypertension.  Noncompliant with medications.  Unknown blood glucose at home.  Blood pressures readings at home average 160/100.  HPI   Patient Active Problem List   Diagnosis Date Noted  . Type II or unspecified type diabetes mellitus without mention of complication, not stated as uncontrolled 09/07/2012  . Essential hypertension, benign 09/07/2012    Past Medical History:  Diagnosis Date  . Diabetes mellitus without complication   . Hypertension     Current Outpatient Medications  Medication Sig Dispense Refill  . AMINO ACIDS COMPLEX PO Take by mouth.    Marland Kitchen CINNAMON PO Take by mouth.    Marland Kitchen GARLIC 1500 PO Take by  mouth.    . metoprolol succinate (TOPROL-XL) 50 MG 24 hr tablet TAKE 1/2 TABLET DAILY FOR 1WK INCREASE TO 1 TAB DAILY 30 tablet 0  . metFORMIN (GLUCOPHAGE) 500 MG tablet Take 1 tablet (500 mg total) by mouth 2 (two) times daily with a meal. (Patient not taking: Reported on 05/26/2018) 60 tablet 3   No current facility-administered medications for this visit.     No Known Allergies  Social History   Socioeconomic History  . Marital status: Single    Spouse name: Not on file  . Number of children: Not on file  . Years of education: Not on file  . Highest education level: Not on file  Occupational History  . Not on file  Social Needs  . Financial resource strain: Not on file  . Food insecurity:    Worry: Not on file    Inability: Not on file  . Transportation needs:    Medical: Not on file    Non-medical: Not on file  Tobacco Use  . Smoking status: Never Smoker  Substance and Sexual Activity  . Alcohol use: No  . Drug use: No  . Sexual activity: Not Currently  Lifestyle  . Physical activity:    Days per week: Not on file    Minutes per session: Not on file  . Stress: Not on file  Relationships  . Social connections:    Talks on phone: Not on file    Gets together: Not on file    Attends religious service: Not on file  Active member of club or organization: Not on file    Attends meetings of clubs or organizations: Not on file    Relationship status: Not on file  . Intimate partner violence:    Fear of current or ex partner: Not on file    Emotionally abused: Not on file    Physically abused: Not on file    Forced sexual activity: Not on file  Other Topics Concern  . Not on file  Social History Narrative  . Not on file    Review of Systems  Constitutional: Negative.  Negative for chills and fever.  HENT: Negative for congestion and sore throat.   Eyes: Negative for blurred vision.  Respiratory: Negative for cough and shortness of breath.   Cardiovascular:  Negative for chest pain and palpitations.  Gastrointestinal: Negative.  Negative for abdominal pain, diarrhea, nausea and vomiting.  Musculoskeletal: Negative for myalgias.  Neurological: Negative for dizziness.  Endo/Heme/Allergies: Negative.   All other systems reviewed and are negative.   Objective   Vitals as reported by the patient: None available There were no vitals filed for this visit. Awake and oriented x3 in no apparent respiratory distress over the phone. There are no diagnoses linked to this encounter. Diagnoses and all orders for this visit:  Uncontrolled hypertension -     Comprehensive metabolic panel; Future -     CBC with Differential/Platelet; Future -     Lipid panel; Future -     metoprolol succinate (TOPROL-XL) 50 MG 24 hr tablet; Take 1 tablet (50 mg total) by mouth daily. Take with or immediately following a meal.  Type 2 diabetes mellitus with hyperglycemia, without long-term current use of insulin (HCC) -     metFORMIN (GLUCOPHAGE) 500 MG tablet; Take 1 tablet (500 mg total) by mouth 2 (two) times daily with a meal. -     CBC with Differential/Platelet; Future -     Lipid panel; Future -     Hemoglobin A1c; Future  Noncompliance with medication regimen  Noncompliant with medications.  Uncontrolled hypertension and diabetes. Restarted on medications.  Scheduled for future blood work. Must follow-up in the office as soon as possible.  Last visit here 4 years ago.   I discussed the assessment and treatment plan with the patient. The patient was provided an opportunity to ask questions and all were answered. The patient agreed with the plan and demonstrated an understanding of the instructions.   The patient was advised to call back or seek an in-person evaluation if the symptoms worsen or if the condition fails to improve as anticipated.  I provided 15 minutes of non-face-to-face time during this encounter.  Georgina Quint, MD  Primary Care at  [facility]

## 2018-05-26 NOTE — Telephone Encounter (Signed)
Called patient at 2:06 pm to triage him before the scheduled Telemed appointment at 3:40 pm.. His mobile number voice mail has not been setup yet, per automated recording. I called the home number and the voice mail recording is full, unable to leave message. I will try to call again before the actual appointment time.

## 2019-09-14 ENCOUNTER — Other Ambulatory Visit: Payer: Self-pay | Admitting: Family

## 2019-09-14 DIAGNOSIS — I10 Essential (primary) hypertension: Secondary | ICD-10-CM

## 2020-08-01 ENCOUNTER — Other Ambulatory Visit: Payer: Self-pay | Admitting: Urgent Care

## 2020-08-01 DIAGNOSIS — M47816 Spondylosis without myelopathy or radiculopathy, lumbar region: Secondary | ICD-10-CM

## 2020-08-08 ENCOUNTER — Other Ambulatory Visit: Payer: Self-pay | Admitting: Urgent Care

## 2020-08-08 ENCOUNTER — Encounter: Payer: Self-pay | Admitting: Psychology

## 2020-08-08 DIAGNOSIS — M47816 Spondylosis without myelopathy or radiculopathy, lumbar region: Secondary | ICD-10-CM

## 2020-08-08 DIAGNOSIS — G3184 Mild cognitive impairment, so stated: Secondary | ICD-10-CM

## 2020-08-08 DIAGNOSIS — M4316 Spondylolisthesis, lumbar region: Secondary | ICD-10-CM

## 2020-08-16 ENCOUNTER — Other Ambulatory Visit: Payer: Self-pay

## 2020-08-17 ENCOUNTER — Other Ambulatory Visit: Payer: Self-pay

## 2020-08-28 ENCOUNTER — Other Ambulatory Visit: Payer: Self-pay | Admitting: Urgent Care

## 2020-08-28 DIAGNOSIS — G3184 Mild cognitive impairment, so stated: Secondary | ICD-10-CM

## 2020-09-06 ENCOUNTER — Ambulatory Visit
Admission: RE | Admit: 2020-09-06 | Discharge: 2020-09-06 | Disposition: A | Discharge status: Home / Self Care | Payer: 59 | Source: Ambulatory Visit | Attending: Urgent Care | Admitting: Urgent Care

## 2020-09-06 ENCOUNTER — Other Ambulatory Visit: Payer: Self-pay

## 2020-09-06 DIAGNOSIS — G3184 Mild cognitive impairment, so stated: Secondary | ICD-10-CM

## 2020-10-17 ENCOUNTER — Encounter: Payer: Self-pay | Admitting: Psychology

## 2020-10-24 ENCOUNTER — Encounter: Payer: Self-pay | Admitting: Psychology

## 2020-10-25 ENCOUNTER — Encounter: Payer: 59 | Admitting: Psychology

## 2020-11-04 ENCOUNTER — Other Ambulatory Visit: Payer: Self-pay | Admitting: Urgent Care

## 2020-11-04 DIAGNOSIS — Q6589 Other specified congenital deformities of hip: Secondary | ICD-10-CM

## 2020-11-05 ENCOUNTER — Other Ambulatory Visit (HOSPITAL_COMMUNITY): Payer: Self-pay | Admitting: Urgent Care

## 2020-11-05 DIAGNOSIS — I6782 Cerebral ischemia: Secondary | ICD-10-CM

## 2020-11-11 ENCOUNTER — Ambulatory Visit: Payer: 59 | Attending: Urgent Care

## 2020-11-18 ENCOUNTER — Encounter: Payer: Self-pay | Admitting: Psychology

## 2020-11-26 ENCOUNTER — Other Ambulatory Visit: Payer: Self-pay

## 2020-11-26 ENCOUNTER — Ambulatory Visit (HOSPITAL_COMMUNITY)
Admission: RE | Admit: 2020-11-26 | Discharge: 2020-11-26 | Disposition: A | Discharge status: Home / Self Care | Payer: 59 | Source: Ambulatory Visit | Attending: Urgent Care | Admitting: Urgent Care

## 2020-11-26 DIAGNOSIS — I110 Hypertensive heart disease with heart failure: Secondary | ICD-10-CM | POA: Insufficient documentation

## 2020-11-26 DIAGNOSIS — E119 Type 2 diabetes mellitus without complications: Secondary | ICD-10-CM | POA: Insufficient documentation

## 2020-11-26 DIAGNOSIS — I6782 Cerebral ischemia: Secondary | ICD-10-CM | POA: Diagnosis present

## 2020-11-26 DIAGNOSIS — I10 Essential (primary) hypertension: Secondary | ICD-10-CM

## 2020-11-26 LAB — ECHOCARDIOGRAM LIMITED BUBBLE STUDY
AR max vel: 2.17 cm2
AV Area VTI: 2.26 cm2
AV Area mean vel: 2.15 cm2
AV Mean grad: 3 mmHg
AV Peak grad: 5.7 mmHg
Ao pk vel: 1.19 m/s
Area-P 1/2: 4.54 cm2
S' Lateral: 3.1 cm

## 2020-11-26 NOTE — Progress Notes (Signed)
  Echocardiogram 2D Echocardiogram has been performed.  Gerda Diss 11/26/2020, 1:50 PM

## 2020-11-29 ENCOUNTER — Ambulatory Visit
Admission: RE | Admit: 2020-11-29 | Discharge: 2020-11-29 | Disposition: A | Discharge status: Home / Self Care | Payer: 59 | Source: Ambulatory Visit | Attending: Urgent Care | Admitting: Urgent Care

## 2020-11-29 DIAGNOSIS — Q6589 Other specified congenital deformities of hip: Secondary | ICD-10-CM

## 2020-12-02 ENCOUNTER — Ambulatory Visit: Payer: Self-pay | Admitting: Psychiatry

## 2020-12-02 NOTE — Progress Notes (Deleted)
[facility]  PATIENT: Shawn Austin DOB: [birth year]  REFERRING CLINICIAN: Crain, Whitney L, PA HISTORY FROM: *** REASON FOR VISIT: memory loss   HISTORICAL  CHIEF COMPLAINT:  No chief complaint on file.   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 TTE with bubble did not detect a shunt.***  TBI: *** No past history of TBI Stroke: *** no past history of stroke Seizures: *** no past history of seizures Sleep: *** no history of sleep apnea.  Has *** never had sleep study.  STOP BANG score *** Mood: *** patient denies anxiety and depression  Functional status: independent in all ** ADLs and IADLs Patient lives with *** in a *** with *** stairs. Cooking: *** Cleaning: *** Shopping: *** Bathing: *** Toileting: *** Driving: *** Bills: *** Medications: *** Ever left the stove on by accident?: *** Forget how to use items around the house?: *** Getting lost going to familiar places?: *** Forgetting loved ones names?: *** Word finding difficulty? *** Sleep: ***  OTHER MEDICAL CONDITIONS: HTN, DM2   REVIEW OF SYSTEMS: Full 14 system review of systems performed and negative with exception of: ***  ALLERGIES: No Known Allergies  HOME MEDICATIONS: Outpatient Medications Prior to Visit  Medication Sig Dispense Refill   AMINO ACIDS COMPLEX PO Take by mouth.     CINNAMON PO Take by mouth.     GARLIC 1500 PO Take by mouth.     metFORMIN (GLUCOPHAGE) 500 MG tablet Take 1 tablet (500 mg total) by mouth 2 (two) times daily with a meal. 60 tablet 3   metoprolol succinate (TOPROL-XL) 50 MG 24 hr tablet Take 1 tablet (50 mg total) by mouth daily. Take with or immediately following a meal. 90 tablet 3   No facility-administered medications prior to  visit.    PAST MEDICAL HISTORY: Past Medical History:  Diagnosis Date   Diabetes mellitus without complication (HCC)    Hypertension     PAST SURGICAL HISTORY: No past surgical history on file.  FAMILY HISTORY: No family history on file.  SOCIAL HISTORY: Social History   Socioeconomic History   Marital status: Single    Spouse name: Not on file   Number of children: Not on file   Years of education: Not on file   Highest education level: Not on file  Occupational History   Not on file  Tobacco Use   Smoking status: Never   Smokeless tobacco: Not on file  Substance and Sexual Activity   Alcohol use: No   Drug use: No   Sexual activity: Not Currently  Other Topics Concern   Not on file  Social History Narrative   Not on file   Social Determinants of Health   Financial Resource Strain: Not on file  Food Insecurity: Not on file  Transportation Needs: Not on file  Physical Activity: Not on file  Stress: Not on file  Social Connections: Not on file  Intimate Partner Violence: Not on file     PHYSICAL EXAM ***  GENERAL EXAM/CONSTITUTIONAL: Vitals: There were no vitals filed for this visit. There is no height or weight on file to calculate BMI. Wt Readings from Last 3 Encounters:  07/12/12 204 lb 6.4 oz (92.7 kg)  06/27/12 203 lb (92.1 kg)  06/08/12 211  lb (95.7 kg)   Patient is in no distress; well developed, nourished and groomed; neck is supple  CARDIOVASCULAR: Examination of carotid arteries is normal; no carotid bruits Regular rate and rhythm, no murmurs Examination of peripheral vascular system by observation and palpation is normal  EYES: Pupils round and reactive to light, Visual fields full to confrontation, Extraocular movements intacts,   MUSCULOSKELETAL: Gait, strength, tone, movements noted in Neurologic exam below  NEUROLOGIC: MENTAL STATUS:  No flowsheet data found. awake, alert, oriented to person, place and time recent and remote  memory intact normal attention and concentration language fluent, comprehension intact, naming intact fund of knowledge appropriate  CRANIAL NERVE:  2nd - no papilledema or hemorrhages on fundoscopic exam 2nd, 3rd, 4th, 6th - pupils equal and reactive to light, visual fields full to confrontation, extraocular muscles intact, no nystagmus 5th - facial sensation symmetric 7th - facial strength symmetric 8th - hearing intact 9th - palate elevates symmetrically, uvula midline 11th - shoulder shrug symmetric 12th - tongue protrusion midline  MOTOR:  normal bulk and tone, no cogwheeling, full strength in the BUE, BLE  SENSORY:  normal and symmetric to light touch, pinprick, temperature, vibration  COORDINATION:  finger-nose-finger, fine finger movements normal, no tremor  REFLEXES:  deep tendon reflexes present and symmetric  GAIT/STATION:  normal     DIAGNOSTIC DATA (LABS, IMAGING, TESTING) - I reviewed patient records, labs, notes, testing and imaging myself where available.  Lab Results  Component Value Date   WBC 6.0 05/19/2012   HGB 13.1 (A) 05/19/2012   HCT 41.0 (A) 05/19/2012   MCV 83.3 05/19/2012      Component Value Date/Time   NA 136 05/19/2012 2033   K 4.1 05/19/2012 2033   CL 101 05/19/2012 2033   CO2 26 05/19/2012 2033   GLUCOSE 155 (H) 05/19/2012 2033   BUN 8 05/19/2012 2033   CREATININE 0.78 05/19/2012 2033   CALCIUM 9.8 05/19/2012 2033   PROT 7.6 05/19/2012 2033   ALBUMIN 4.5 05/19/2012 2033   AST 17 05/19/2012 2033   ALT 16 05/19/2012 2033   ALKPHOS 90 05/19/2012 2033   BILITOT 1.0 05/19/2012 2033   Lab Results  Component Value Date   CHOL 200 04/14/2012   HDL 42 04/14/2012   LDLCALC 119 (H) 04/14/2012   TRIG 195 (H) 04/14/2012   CHOLHDL 4.8 04/14/2012   Lab Results  Component Value Date   HGBA1C 11.2 06/27/2012   No results found for: [id number] Lab Results  Component Value Date   TSH 0.984 04/14/2012    ***    ASSESSMENT  AND PLAN  55 y.o. year old male with ***   No diagnosis found.    PLAN: Memory loss: - Labs: CBC, CMP, TSH, B12, RPR***  - Will place referral for neuropsychological testing to better characterize his/her reported deficits and to establish a cognitive baseline.   Stroke: - A1c, lipid panel*** - CTA head/neck*** - start asa and lipitor  - Follow up after testing is complete.   No orders of the defined types were placed in this encounter.   No orders of the defined types were placed in this encounter.   No follow-ups on file.  I spent an average of *** chart reviewing and counseling the patient, with at least 50% of the time face to face with the patient. General brain health measures discussed, including the importance of regular aerobic exercise. Reviewed safety measures including driving safety.   Ocie Doyne, MD  [facility]  [place], [place], [place] [address] [phone]

## 2020-12-23 ENCOUNTER — Telehealth: Payer: Self-pay

## 2020-12-23 NOTE — Telephone Encounter (Signed)
REFERRAL SCANNED TO REFERRAL, NOTES IN EPIC

## 2020-12-26 NOTE — Progress Notes (Signed)
PATIENT DID NOT APPEAR FOR APPOINTMENT

## 2020-12-27 ENCOUNTER — Ambulatory Visit (INDEPENDENT_AMBULATORY_CARE_PROVIDER_SITE_OTHER): Payer: 59 | Admitting: Internal Medicine

## 2020-12-27 DIAGNOSIS — E785 Hyperlipidemia, unspecified: Secondary | ICD-10-CM

## 2020-12-27 DIAGNOSIS — R002 Palpitations: Secondary | ICD-10-CM

## 2020-12-27 DIAGNOSIS — E118 Type 2 diabetes mellitus with unspecified complications: Secondary | ICD-10-CM

## 2020-12-27 DIAGNOSIS — I10 Essential (primary) hypertension: Secondary | ICD-10-CM

## 2020-12-27 DIAGNOSIS — I6381 Other cerebral infarction due to occlusion or stenosis of small artery: Secondary | ICD-10-CM

## 2021-01-02 ENCOUNTER — Encounter: Payer: Self-pay | Admitting: *Deleted

## 2021-01-02 ENCOUNTER — Other Ambulatory Visit: Payer: Self-pay | Admitting: *Deleted

## 2021-01-06 ENCOUNTER — Ambulatory Visit: Payer: Self-pay | Admitting: Psychiatry

## 2021-01-06 ENCOUNTER — Encounter: Payer: Self-pay | Admitting: Psychiatry

## 2021-01-06 NOTE — Progress Notes (Deleted)
[facility]  PATIENT: Shawn Austin DOB: [birth year]  REFERRING CLINICIAN: Crain, Whitney L, PA HISTORY FROM: *** REASON FOR VISIT: memory loss   HISTORICAL  CHIEF COMPLAINT:  No chief complaint on file.   HISTORY OF PRESENT ILLNESS:  The patient presents for evaluation of memory loss which has been present since***  The patient was found to have a right lacunar stroke earlier this year when MRI was done for mild cognitive impairment. He was seen by Cardiology who planned to do a heart monitor to assess for afib. TTE did not show evidence of an obvious shunt, however the study was suboptimal and could not rule out shunt.  TBI: *** No past history of TBI Stroke: *** no past history of stroke Seizures: *** no past history of seizures Sleep: *** no history of sleep apnea.  Has *** never had sleep study.  STOP BANG score *** Mood: *** patient denies anxiety and depression  Functional status: independent in all ** ADLs and IADLs Patient lives with *** in a *** with *** stairs. Cooking: *** Cleaning: *** Shopping: *** Bathing: *** Toileting: *** Driving: *** Bills: *** Medications: *** Ever left the stove on by accident?: *** Forget how to use items around the house?: *** Getting lost going to familiar places?: *** Forgetting loved ones names?: *** Word finding difficulty? *** Sleep: ***  OTHER MEDICAL CONDITIONS: ***   REVIEW OF SYSTEMS: Full 14 system review of systems performed and negative with exception of: DM 2, HTN, HLD, right centrum ovale stroke   ALLERGIES: No Known Allergies  HOME MEDICATIONS: Outpatient Medications Prior to Visit  Medication Sig Dispense Refill   AMINO ACIDS COMPLEX PO Take by mouth.     amLODipine (NORVASC) 5 MG tablet Take 5 mg by mouth daily.     CINNAMON PO Take by mouth.     GARLIC 99991111 PO Take by mouth.     glipiZIDE (GLUCOTROL) 10 MG tablet Take 10 mg by mouth daily before breakfast.     lisinopril  (ZESTRIL) 20 MG tablet lisinopril 20 mg tablet  TAKE 1 TABLET BY MOUTH EVERY DAY     meloxicam (MOBIC) 15 MG tablet 15 mg daily.     metFORMIN (GLUCOPHAGE) 500 MG tablet Take 1 tablet (500 mg total) by mouth 2 (two) times daily with a meal. 60 tablet 3   methocarbamol (ROBAXIN) 500 MG tablet 4 x daily as needed     metoprolol succinate (TOPROL-XL) 50 MG 24 hr tablet Take 1 tablet (50 mg total) by mouth daily. Take with or immediately following a meal. 90 tablet 3   No facility-administered medications prior to visit.    PAST MEDICAL HISTORY: Past Medical History:  Diagnosis Date   Anemia    Articular cartilage disorder of hip    Cerebral ischemia    Congenital deformity of right hip joint    Degeneration of cervical intervertebral disc    Diabetes mellitus without complication (HCC)    type 2   Fatigue    Hyperglycemia due to diabetes mellitus (HCC)    Hyperlipidemia    Hypertension    Hypocalcemia    Hyponatremia    Lower limb pain, inferior, right    Mild cognitive disorder    Pain in joint of left knee    Recurrent falls    Spondylolisthesis of lumbar region    Spondylolisthesis, lumbar region     PAST SURGICAL HISTORY: No past surgical history on file.  FAMILY HISTORY: No  family history on file.  SOCIAL HISTORY: Social History   Socioeconomic History   Marital status: Single    Spouse name: Not on file   Number of children: Not on file   Years of education: Not on file   Highest education level: Not on file  Occupational History   Not on file  Tobacco Use   Smoking status: Never   Smokeless tobacco: Not on file  Substance and Sexual Activity   Alcohol use: No   Drug use: No   Sexual activity: Not Currently  Other Topics Concern   Not on file  Social History Narrative   Not on file   Social Determinants of Health   Financial Resource Strain: Not on file  Food Insecurity: Not on file  Transportation Needs: Not on file  Physical Activity: Not on  file  Stress: Not on file  Social Connections: Not on file  Intimate Partner Violence: Not on file     PHYSICAL EXAM ***  GENERAL EXAM/CONSTITUTIONAL: Vitals: There were no vitals filed for this visit. There is no height or weight on file to calculate BMI. Wt Readings from Last 3 Encounters:  07/12/12 204 lb 6.4 oz (92.7 kg)  06/27/12 203 lb (92.1 kg)  06/08/12 211 lb (95.7 kg)   Patient is in no distress; well developed, nourished and groomed; neck is supple  CARDIOVASCULAR: Examination of carotid arteries is normal; no carotid bruits Regular rate and rhythm, no murmurs Examination of peripheral vascular system by observation and palpation is normal  EYES: Pupils round and reactive to light, Visual fields full to confrontation, Extraocular movements intacts,   MUSCULOSKELETAL: Gait, strength, tone, movements noted in Neurologic exam below  NEUROLOGIC: MENTAL STATUS:  No flowsheet data found. awake, alert, oriented to person, place and time recent and remote memory intact normal attention and concentration language fluent, comprehension intact, naming intact fund of knowledge appropriate  CRANIAL NERVE:  2nd - no papilledema or hemorrhages on fundoscopic exam 2nd, 3rd, 4th, 6th - pupils equal and reactive to light, visual fields full to confrontation, extraocular muscles intact, no nystagmus 5th - facial sensation symmetric 7th - facial strength symmetric 8th - hearing intact 9th - palate elevates symmetrically, uvula midline 11th - shoulder shrug symmetric 12th - tongue protrusion midline  MOTOR:  normal bulk and tone, no cogwheeling, full strength in the BUE, BLE  SENSORY:  normal and symmetric to light touch, pinprick, temperature, vibration  COORDINATION:  finger-nose-finger, fine finger movements normal, no tremor  REFLEXES:  deep tendon reflexes present and symmetric  GAIT/STATION:  normal     DIAGNOSTIC DATA (LABS, IMAGING, TESTING) - I  reviewed patient records, labs, notes, testing and imaging myself where available.  Lab Results  Component Value Date   WBC 6.0 05/19/2012   HGB 13.1 (A) 05/19/2012   HCT 41.0 (A) 05/19/2012   MCV 83.3 05/19/2012      Component Value Date/Time   NA 136 05/19/2012 2033   K 4.1 05/19/2012 2033   CL 101 05/19/2012 2033   CO2 26 05/19/2012 2033   GLUCOSE 155 (H) 05/19/2012 2033   BUN 8 05/19/2012 2033   CREATININE 0.78 05/19/2012 2033   CALCIUM 9.8 05/19/2012 2033   PROT 7.6 05/19/2012 2033   ALBUMIN 4.5 05/19/2012 2033   AST 17 05/19/2012 2033   ALT 16 05/19/2012 2033   ALKPHOS 90 05/19/2012 2033   BILITOT 1.0 05/19/2012 2033   Lab Results  Component Value Date   CHOL 200 04/14/2012  HDL 42 04/14/2012   LDLCALC 119 (H) 04/14/2012   TRIG 195 (H) 04/14/2012   CHOLHDL 4.8 04/14/2012   Lab Results  Component Value Date   HGBA1C 11.2 06/27/2012   No results found for: VITAMINB12 Lab Results  Component Value Date   TSH 0.984 04/14/2012    MRI 09/06/20: subacute right centrum semiovale stroke, chronic microvascular disease greater than expected for age    ASSESSMENT AND PLAN  55 y.o. year old male with ***   No diagnosis found.    PLAN: - CTA head/neck - ASA 81, lipitor 40 mg*** - lipid panel, A1c***, CBC, CMP, TSH, B12 - Will place referral for neuropsychological testing to better characterize his/her reported deficits and to establish a cognitive baseline.  - Referral placed for cognitive rehabilitation to help with some of his/her issues.  - Follow up after testing is complete.   No orders of the defined types were placed in this encounter.   No orders of the defined types were placed in this encounter.   No follow-ups on file.  I spent an average of *** chart reviewing and counseling the patient, with at least 50% of the time face to face with the patient. General brain health measures discussed, including the importance of regular aerobic exercise.  Reviewed safety measures including driving safety.   Genia Harold, MD  [facility] [address], [place], [place] [address] [phone]

## 2021-01-24 NOTE — Progress Notes (Deleted)
Cardiology Office Note:    Date:  01/27/2021   ID:  [name], DOB January 24, 1966, MRN [id number]  PCP:  Judee Clara, FNP   [facility] Providers Cardiologist:  Alverda Skeans, MD Referring MD: Maretta Bees, [place]   Chief Complaint/Reason for Referral:  Establish cardiovascular care  ASSESSMENT & PLAN:    Palpitations  Lacunar infarction [facility])  Hyperlipidemia, unspecified hyperlipidemia type  Hypertension, unspecified type  Type 2 diabetes mellitus with complication, without long-term current use of insulin (HCC)    I will obtain a monitor to evaluate for malignant arrhythmias.  Recent limited echocardiogram demonstrated no significant abnormality and normal ejection fraction. Check TSH and CMP.  Follow up 6 months.  The patient has imaging evidence of a centrum ovale infarction.  Lacunar infarcts are predominantly due to progressive occlusive small vessel disease rather than embolic events.   Even if a PFO is present, it is likely incidental.  Cont aspirin, statin, and optimal BP control.   Start atorvastatin 40mg , goal LDL < 70, refer to pharmacy  ^^^  Start ASA 81 and jardiance 10mg ; contin  {Are you ordering a CV Procedure (e.g. stress test, cath, DCCV, TEE, etc)?   Press F2        :08-23-1997   Dispo:  No follow-ups on file.     Medication Adjustments/Labs and Tests Ordered: Current medicines are reviewed at length with the patient today.  Concerns regarding medicines are outlined above.   Tests Ordered: No orders of the defined types were placed in this encounter.   Medication Changes: No orders of the defined types were placed in this encounter.   History of Present Illness:    FOCUSED CARDIOVASCULAR PROBLEM LIST:   1.  Cognitive impairment with chronic microvascular disease and subacute right centrum ovale infarction on MRI 2022  2.  Type 2 diabetes  3.  Hypertension   Shawn Austin is a 55 y.o. male with the indicated history  referred for recommendations regarding atrial fibrillation.  The patient was recently seen by their primary care provider.  Of note the patient had a head MRI in July of this year due to mild cognitive impairment.  This demonstrated a small area of subacute ischemia within the right centrum ovale and chronic microvascular disease greater than expected for his age.  He had a bubble study echo which I reviewed and demonstrated suboptimal opacification of the right-sided chambers and so shunt could not be excluded.  He was seen again in their office and was doing relatively well.      Previous Medical History: Past Medical History:  Diagnosis Date   Anemia    Articular cartilage disorder of hip    Cerebral ischemia    Congenital deformity of right hip joint    Degeneration of cervical intervertebral disc    Diabetes mellitus without complication (HCC)    type 2   Fatigue    Hyperglycemia due to diabetes mellitus (HCC)    Hyperlipidemia    Hypertension    Hypocalcemia    Hyponatremia    Lower limb pain, inferior, right    Mild cognitive disorder    Pain in joint of left knee    Recurrent falls    Spondylolisthesis of lumbar region    Spondylolisthesis, lumbar region      Current Medications: No outpatient medications have been marked as taking for the 01/27/21 encounter (Appointment) with August, MD.     Allergies:    Patient has  no known allergies.   Social History:   Social History   Tobacco Use   Smoking status: Never  Substance Use Topics   Alcohol use: No   Drug use: No     Family Hx: No family history on file.   Review of Systems:   Please see the history of present illness.    All other systems reviewed and are negative.  EKGs/Labs/Other Test Reviewed:    EKG:  EKG today:***; prior EKG: ***  Prior CV studies: *** {Select studies to display:26339}   Imaging studies that I have independently reviewed today: ***  Recent Labs: No results found  for requested labs within last 8760 hours.   Recent Lipid Panel Lab Results  Component Value Date/Time   CHOL 200 04/14/2012 06:09 PM   TRIG 195 (H) 04/14/2012 06:09 PM   HDL 42 04/14/2012 06:09 PM   LDLCALC 119 (H) 04/14/2012 06:09 PM    Risk Assessment/Calculations:    {Does this patient have ATRIAL FIBRILLATION?[phone]}      Physical Exam:    VS:  There were no vitals taken for this visit.   Wt Readings from Last 3 Encounters:  07/12/12 204 lb 6.4 oz (92.7 kg)  06/27/12 203 lb (92.1 kg)  06/08/12 211 lb (95.7 kg)    GENERAL:  No apparent distress, AOx3 HEENT:  No carotid bruits, +2 carotid impulses, no scleral icterus CAR: RRR Irregular RR*** no murmurs***, gallops, rubs, or thrills RES:  Clear to auscultation bilaterally ABD:  Soft, nontender, nondistended, positive bowel sounds x 4 VASC:  +2 radial pulses, +2 carotid pulses, palpable pedal pulses NEURO:  CN 2-12 grossly intact; motor and sensory grossly intact PSYCH:  No active depression or anxiety EXT:  No edema, ecchymosis, or cyanosis  Signed, Early Osmond, MD  01/27/2021 7:42 AM    [address], [place], [place]  [address] Phone: [phone]; Fax: [phone]

## 2021-01-27 ENCOUNTER — Ambulatory Visit: Payer: 59 | Admitting: Internal Medicine

## 2021-01-27 DIAGNOSIS — E785 Hyperlipidemia, unspecified: Secondary | ICD-10-CM

## 2021-01-27 DIAGNOSIS — I10 Essential (primary) hypertension: Secondary | ICD-10-CM

## 2021-01-27 DIAGNOSIS — I6381 Other cerebral infarction due to occlusion or stenosis of small artery: Secondary | ICD-10-CM

## 2021-01-27 DIAGNOSIS — R002 Palpitations: Secondary | ICD-10-CM

## 2021-01-31 ENCOUNTER — Ambulatory Visit: Payer: Self-pay | Admitting: Psychiatry

## 2021-02-21 ENCOUNTER — Ambulatory Visit: Payer: 59 | Admitting: Internal Medicine

## 2021-03-10 ENCOUNTER — Ambulatory Visit: Payer: 59 | Admitting: Internal Medicine

## 2021-09-01 ENCOUNTER — Inpatient Hospital Stay (HOSPITAL_COMMUNITY)
Admission: EM | Admit: 2021-09-01 | Discharge: 2021-09-11 | DRG: 066 | Disposition: A | Discharge status: Rehabilitation Facility | Payer: Medicaid Other | Attending: Internal Medicine | Admitting: Internal Medicine

## 2021-09-01 ENCOUNTER — Encounter (HOSPITAL_COMMUNITY): Payer: Self-pay | Admitting: Emergency Medicine

## 2021-09-01 ENCOUNTER — Emergency Department (HOSPITAL_COMMUNITY): Payer: Medicaid Other

## 2021-09-01 DIAGNOSIS — Z7984 Long term (current) use of oral hypoglycemic drugs: Secondary | ICD-10-CM

## 2021-09-01 DIAGNOSIS — E876 Hypokalemia: Secondary | ICD-10-CM | POA: Diagnosis not present

## 2021-09-01 DIAGNOSIS — E785 Hyperlipidemia, unspecified: Secondary | ICD-10-CM | POA: Diagnosis present

## 2021-09-01 DIAGNOSIS — Z79899 Other long term (current) drug therapy: Secondary | ICD-10-CM

## 2021-09-01 DIAGNOSIS — I6782 Cerebral ischemia: Secondary | ICD-10-CM | POA: Diagnosis present

## 2021-09-01 DIAGNOSIS — E119 Type 2 diabetes mellitus without complications: Secondary | ICD-10-CM | POA: Diagnosis present

## 2021-09-01 DIAGNOSIS — R9431 Abnormal electrocardiogram [ECG] [EKG]: Secondary | ICD-10-CM

## 2021-09-01 DIAGNOSIS — I6381 Other cerebral infarction due to occlusion or stenosis of small artery: Secondary | ICD-10-CM | POA: Diagnosis not present

## 2021-09-01 DIAGNOSIS — E1165 Type 2 diabetes mellitus with hyperglycemia: Secondary | ICD-10-CM | POA: Diagnosis present

## 2021-09-01 DIAGNOSIS — H539 Unspecified visual disturbance: Secondary | ICD-10-CM | POA: Diagnosis not present

## 2021-09-01 DIAGNOSIS — W19XXXA Unspecified fall, initial encounter: Secondary | ICD-10-CM | POA: Diagnosis present

## 2021-09-01 DIAGNOSIS — I639 Cerebral infarction, unspecified: Secondary | ICD-10-CM | POA: Diagnosis not present

## 2021-09-01 DIAGNOSIS — Z91148 Patient's other noncompliance with medication regimen for other reason: Secondary | ICD-10-CM

## 2021-09-01 DIAGNOSIS — I160 Hypertensive urgency: Secondary | ICD-10-CM | POA: Diagnosis present

## 2021-09-01 DIAGNOSIS — Z83511 Family history of glaucoma: Secondary | ICD-10-CM

## 2021-09-01 DIAGNOSIS — R29704 NIHSS score 4: Secondary | ICD-10-CM | POA: Diagnosis present

## 2021-09-01 DIAGNOSIS — R471 Dysarthria and anarthria: Secondary | ICD-10-CM | POA: Diagnosis present

## 2021-09-01 DIAGNOSIS — Z20822 Contact with and (suspected) exposure to covid-19: Secondary | ICD-10-CM | POA: Diagnosis present

## 2021-09-01 DIAGNOSIS — I4510 Unspecified right bundle-branch block: Secondary | ICD-10-CM | POA: Diagnosis present

## 2021-09-01 DIAGNOSIS — Z597 Insufficient social insurance and welfare support: Secondary | ICD-10-CM

## 2021-09-01 DIAGNOSIS — I63411 Cerebral infarction due to embolism of right middle cerebral artery: Secondary | ICD-10-CM | POA: Diagnosis not present

## 2021-09-01 DIAGNOSIS — H518 Other specified disorders of binocular movement: Secondary | ICD-10-CM | POA: Diagnosis present

## 2021-09-01 DIAGNOSIS — I10 Essential (primary) hypertension: Secondary | ICD-10-CM | POA: Diagnosis present

## 2021-09-01 DIAGNOSIS — G463 Brain stem stroke syndrome: Secondary | ICD-10-CM | POA: Diagnosis present

## 2021-09-01 DIAGNOSIS — R482 Apraxia: Secondary | ICD-10-CM | POA: Diagnosis not present

## 2021-09-01 DIAGNOSIS — T465X6A Underdosing of other antihypertensive drugs, initial encounter: Secondary | ICD-10-CM | POA: Diagnosis present

## 2021-09-01 DIAGNOSIS — R270 Ataxia, unspecified: Secondary | ICD-10-CM | POA: Diagnosis present

## 2021-09-01 DIAGNOSIS — D649 Anemia, unspecified: Secondary | ICD-10-CM | POA: Diagnosis present

## 2021-09-01 DIAGNOSIS — E1169 Type 2 diabetes mellitus with other specified complication: Secondary | ICD-10-CM | POA: Diagnosis present

## 2021-09-01 LAB — I-STAT CHEM 8, ED
BUN: 10 mg/dL (ref 6–20)
Calcium, Ion: 1.11 mmol/L — ABNORMAL LOW (ref 1.15–1.40)
Chloride: 105 mmol/L (ref 98–111)
Creatinine, Ser: 0.9 mg/dL (ref 0.61–1.24)
Glucose, Bld: 199 mg/dL — ABNORMAL HIGH (ref 70–99)
HCT: 40 % (ref 39.0–52.0)
Hemoglobin: 13.6 g/dL (ref 13.0–17.0)
Potassium: 4.3 mmol/L (ref 3.5–5.1)
Sodium: 139 mmol/L (ref 135–145)
TCO2: 23 mmol/L (ref 22–32)

## 2021-09-01 LAB — URINALYSIS, ROUTINE W REFLEX MICROSCOPIC
Bacteria, UA: NONE SEEN
Bilirubin Urine: NEGATIVE
Glucose, UA: NEGATIVE mg/dL
Hgb urine dipstick: NEGATIVE
Ketones, ur: 20 mg/dL — AB
Leukocytes,Ua: NEGATIVE
Nitrite: NEGATIVE
Protein, ur: >=300 mg/dL — AB
Specific Gravity, Urine: >1.046 — ABNORMAL HIGH (ref 1.005–1.030)
pH: 5 (ref 5.0–8.0)

## 2021-09-01 LAB — CBG MONITORING, ED: Glucose-Capillary: 190 mg/dL — ABNORMAL HIGH (ref 70–99)

## 2021-09-01 LAB — COMPREHENSIVE METABOLIC PANEL
ALT: 12 U/L (ref 0–44)
AST: 16 U/L (ref 15–41)
Albumin: 3.6 g/dL (ref 3.5–5.0)
Alkaline Phosphatase: 93 U/L (ref 38–126)
Anion gap: 11 (ref 5–15)
BUN: 9 mg/dL (ref 6–20)
CO2: 24 mmol/L (ref 22–32)
Calcium: 9.1 mg/dL (ref 8.9–10.3)
Chloride: 102 mmol/L (ref 98–111)
Creatinine, Ser: 0.97 mg/dL (ref 0.61–1.24)
GFR, Estimated: >60 mL/min (ref >60)
Glucose, Bld: 198 mg/dL — ABNORMAL HIGH (ref 70–99)
Potassium: 4.2 mmol/L (ref 3.5–5.1)
Sodium: 137 mmol/L (ref 135–145)
Total Bilirubin: 1.2 mg/dL (ref 0.3–1.2)
Total Protein: 6.9 g/dL (ref 6.5–8.1)

## 2021-09-01 LAB — RESP PANEL BY RT-PCR (FLU A&B, COVID) ARPGX2
Influenza A by PCR: NEGATIVE
Influenza B by PCR: NEGATIVE
SARS Coronavirus 2 by RT PCR: NEGATIVE

## 2021-09-01 LAB — RAPID URINE DRUG SCREEN, HOSP PERFORMED
Amphetamines: NOT DETECTED
Barbiturates: NOT DETECTED
Benzodiazepines: NOT DETECTED
Cocaine: NOT DETECTED
Opiates: NOT DETECTED
Tetrahydrocannabinol: NOT DETECTED

## 2021-09-01 LAB — PROTIME-INR
INR: 1.1 (ref 0.8–1.2)
Prothrombin Time: 14 seconds (ref 11.4–15.2)

## 2021-09-01 LAB — ETHANOL: Alcohol, Ethyl (B): <10 mg/dL (ref <10)

## 2021-09-01 LAB — APTT: aPTT: 29 seconds (ref 24–36)

## 2021-09-01 MED ORDER — IOHEXOL 350 MG/ML SOLN
75.0000 mL | Freq: Once | INTRAVENOUS | Status: AC | PRN
  Administered 2021-09-01: 75 mL via INTRAVENOUS

## 2021-09-01 MED ORDER — LORAZEPAM 2 MG/ML IJ SOLN
0.5000 mg | Freq: Once | INTRAMUSCULAR | Status: AC
  Administered 2021-09-01: 0.5 mg via INTRAVENOUS
  Filled 2021-09-01: qty 1

## 2021-09-01 MED ORDER — IOHEXOL 350 MG/ML SOLN
40.0000 mL | Freq: Once | INTRAVENOUS | Status: AC | PRN
Start: 2021-09-01 — End: 2021-09-01
  Administered 2021-09-01: 40 mL via INTRAVENOUS

## 2021-09-01 NOTE — ED Provider Notes (Cosign Needed Addendum)
[facility] EMERGENCY DEPARTMENT Provider Note   CSN: [id number] Arrival date & time: 09/01/21  1503  An emergency department physician performed an initial assessment on this suspected stroke patient at 67.  History  Chief Complaint  Patient presents with   Code Stroke    Shawn Austin is a 56 y.o. male with history of hypertension, diabetes, hyperlipidemia and previous history of Bell's palsy who presents to the emergency department for evaluation of possible strokelike symptoms.  Patient's fianc states that yesterday he was having gait imbalances, leaning to the right and stumbling.  He had a couple of falls and she needed to help him get back up.  She also notes that then about 2 PM to 3 PM this afternoon, she noted that his eyes began deviating toward the right along with some slurred speech.  Overall, his coordination just seems poor to her.  Patient and wife also mention that he had a headache on the right side of his head earlier today that was quite severe, but is asymptomatic at this time.  Denies chest pain, abdominal pain, current headache, shortness of breath, syncope, seizures numbness and tingling.  Emesis Associated symptoms: headaches   Associated symptoms: no diarrhea        Home Medications Prior to Admission medications   Medication Sig Start Date End Date Taking? Authorizing Provider  glipiZIDE (GLUCOTROL) 10 MG tablet Take 10 mg by mouth daily before breakfast. Patient not taking: Reported on 09/01/2021    [provider]  metFORMIN (GLUCOPHAGE) 500 MG tablet Take 1 tablet (500 mg total) by mouth 2 (two) times daily with a meal. Patient not taking: Reported on 09/01/2021 05/26/18   Georgina Quint, MD  metoprolol succinate (TOPROL-XL) 50 MG 24 hr tablet Take 1 tablet (50 mg total) by mouth daily. Take with or immediately following a meal. Patient not taking: Reported on 09/01/2021 05/26/18   Georgina Quint, MD       Allergies    Patient has no known allergies.    Review of Systems   Review of Systems  Gastrointestinal:  Positive for vomiting. Negative for diarrhea.  Musculoskeletal:  Positive for gait problem.  Neurological:  Positive for facial asymmetry, weakness and headaches. Negative for seizures.    Physical Exam Updated Vital Signs BP (!) 150/105   Pulse 74   Temp 97.6 F (36.4 C) (Oral)   Resp 18   Ht 5\' 9"  (1.753 m)   Wt 86.2 kg   SpO2 99%   BMI 28.06 kg/m  Physical Exam Vitals and nursing note reviewed.  Constitutional:      General: He is not in acute distress.    Appearance: He is not ill-appearing.  HENT:     Head: Atraumatic.  Eyes:     General: No visual field deficit.    Extraocular Movements: Extraocular movements intact.     Conjunctiva/sclera: Conjunctivae normal.     Visual Fields: Right eye visual fields normal and left eye visual fields normal.  Cardiovascular:     Rate and Rhythm: Normal rate and regular rhythm.     Pulses: Normal pulses.     Heart sounds: No murmur heard. Pulmonary:     Effort: Pulmonary effort is normal. No respiratory distress.     Breath sounds: Normal breath sounds.  Abdominal:     General: Abdomen is flat. There is no distension.     Palpations: Abdomen is soft.     Tenderness: There is no abdominal  tenderness.  Musculoskeletal:        General: Normal range of motion.     Cervical back: Normal range of motion.  Skin:    General: Skin is warm and dry.     Capillary Refill: Capillary refill takes less than 2 seconds.  Neurological:     General: No focal deficit present.     Mental Status: He is alert.     Comments: Speech is slightly slurred and stunted, able to follow commands Equal strength with straight leg raise off table Subjective sensation intact bilaterally Poor finger to nose No pronator drift Bilateral eye deviation toward right    Psychiatric:        Mood and Affect: Mood normal.     ED Results /  Procedures / Treatments   Labs (all labs ordered are listed, but only abnormal results are displayed) Labs Reviewed  COMPREHENSIVE METABOLIC PANEL - Abnormal; Notable for the following components:      Result Value   Glucose, Bld 198 (*)    All other components within normal limits  URINALYSIS, ROUTINE W REFLEX MICROSCOPIC - Abnormal; Notable for the following components:   Specific Gravity, Urine >1.046 (*)    Ketones, ur 20 (*)    Protein, ur >=300 (*)    All other components within normal limits  CBG MONITORING, ED - Abnormal; Notable for the following components:   Glucose-Capillary 190 (*)    All other components within normal limits  I-STAT CHEM 8, ED - Abnormal; Notable for the following components:   Glucose, Bld 199 (*)    Calcium, Ion 1.11 (*)    All other components within normal limits  RESP PANEL BY RT-PCR (FLU A&B, COVID) ARPGX2  ETHANOL  PROTIME-INR  APTT  RAPID URINE DRUG SCREEN, HOSP PERFORMED    EKG None  Radiology CT CEREBRAL PERFUSION W CONTRAST  Result Date: 09/01/2021 CLINICAL DATA:  Right gaze.  Slurred speech. EXAM: CT PERFUSION BRAIN TECHNIQUE: Multiphase CT imaging of the brain was performed following IV bolus contrast injection. Subsequent parametric perfusion maps were calculated using RAPID software. RADIATION DOSE REDUCTION: This exam was performed according to the departmental dose-optimization program which includes automated exposure control, adjustment of the mA and/or kV according to patient size and/or use of iterative reconstruction technique. CONTRAST:  64mL OMNIPAQUE IOHEXOL 350 MG/ML SOLN COMPARISON:  CT studies earlier same day FINDINGS: CT Brain Perfusion Findings: CBF (<30%) Volume: 71mL Perfusion (Tmax>6.0s) volume: 62mL Mismatch Volume: 8mL ASPECTS on noncontrast CT Head: 10 at 1930 hours today. Infarct Core: 0 mL Infarction Location:None IMPRESSION: Negative perfusion study. This argues strongly against acute left anterior cerebral artery  occlusion. Electronically Signed   By: Paulina Fusi M.D.   On: 09/01/2021 20:52   CT ANGIO HEAD NECK W WO CM (CODE STROKE)  Result Date: 09/01/2021 CLINICAL DATA:  Neuro deficit, acute, stroke suspected. Slurred speech. Right gaze deviation. EXAM: CT ANGIOGRAPHY HEAD AND NECK TECHNIQUE: Multidetector CT imaging of the head and neck was performed using the standard protocol during bolus administration of intravenous contrast. Multiplanar CT image reconstructions and MIPs were obtained to evaluate the vascular anatomy. Carotid stenosis measurements (when applicable) are obtained utilizing NASCET criteria, using the distal internal carotid diameter as the denominator. RADIATION DOSE REDUCTION: This exam was performed according to the departmental dose-optimization program which includes automated exposure control, adjustment of the mA and/or kV according to patient size and/or use of iterative reconstruction technique. CONTRAST:  49mL OMNIPAQUE IOHEXOL 350 MG/ML SOLN COMPARISON:  Head CT earlier same day.  MRI 09/06/2020. FINDINGS: CTA NECK FINDINGS Aortic arch: Normal. No atherosclerotic change, aneurysm or dissection. Right carotid system: Common carotid artery widely patent to the bifurcation. Carotid bifurcation is normal without soft or calcified plaque. Cervical ICA is normal. Left carotid system: Similarly normal. Vertebral arteries: Both vertebral artery origins are patent. The right vertebral artery is a very small vessel. The left vertebral artery is dominant. Both vessels are patent through the cervical region to the foramen magnum. Skeleton: Ordinary cervical spondylosis. Other neck: No mass or lymphadenopathy. Upper chest: Lung apices are clear. Review of the MIP images confirms the above findings CTA HEAD FINDINGS Anterior circulation: Both internal carotid arteries are widely patent through the skull base and siphon regions. There is siphon atherosclerotic disease, worse on the right than the left,  with stenosis possibly in the range of 50%. The anterior and middle cerebral vessels are patent. There is no large vessel occlusion. There is a moderate stenosis of the distal M1 segment on the left. There is atherosclerotic narrowing and irregularity of the MCA branch vessels more diffusely on both sides. Patient may have variant anterior cerebral artery anatomy with diminutive left anterior cerebral vessel. However, I have some concern that there could be occlusion of the left anterior cerebral artery. Posterior circulation: Both vertebral arteries are patent through the foramen magnum. Both vessels reach the basilar. No basilar stenosis. PCA vessels receive most of there supply from the anterior circulation. There is considerable distal vessel atherosclerotic irregularity. Venous sinuses: Patent and normal. Anatomic variants: None significant. Review of the MIP images confirms the above findings IMPRESSION: No significant disease in the neck. There appears to be variant anterior cerebral anatomy, with a diminutive left anterior cerebral artery. However, I do have concern that the left anterior cerebral artery could be acutely occluded. Intracranial atherosclerotic disease diffusely affecting the anterior, middle and posterior cerebral artery vessels. This includes a moderate stenosis of the distal M1 segment on the left. See above discussion about the potential for left anterior cerebral artery occlusion. Atherosclerotic disease in both carotid siphon regions with stenosis approaching 50%, particularly on the right. Findings discussed with Dr. Derry Lory at 2010 hours. Electronically Signed   By: Paulina Fusi M.D.   On: 09/01/2021 20:23   CT HEAD CODE STROKE WO CONTRAST`  Result Date: 09/01/2021 CLINICAL DATA:  Code stroke.  Right gaze and slurred speech EXAM: CT HEAD WITHOUT CONTRAST TECHNIQUE: Contiguous axial images were obtained from the base of the skull through the vertex without intravenous contrast.  RADIATION DOSE REDUCTION: This exam was performed according to the departmental dose-optimization program which includes automated exposure control, adjustment of the mA and/or kV according to patient size and/or use of iterative reconstruction technique. COMPARISON:  None Available. FINDINGS: Brain: There is no mass, hemorrhage or extra-axial collection. The size and configuration of the ventricles and extra-axial CSF spaces are normal. 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 Vascular: No abnormal hyperdensity of the major intracranial arteries or dural venous sinuses. No intracranial atherosclerosis. Skull: The visualized skull base, calvarium and extracranial soft tissues are normal. Sinuses/Orbits: No fluid levels or advanced mucosal thickening of the visualized paranasal sinuses. No mastoid or middle ear effusion. The orbits are normal. ASPECTS [facility] Stroke Program Early CT Score) - Ganglionic level infarction (caudate, lentiform nuclei, internal capsule, insula, M1-M3 cortex): 7 - Supraganglionic infarction (M4-M6 cortex): 3 Total score (0-10 with 10 being normal): 10 IMPRESSION: 1. No acute intracranial abnormality. 2. ASPECTS  is 10. These results were communicated to Dr. Erick BlinksSalman Khaliqdina at 7:37 pm on 09/01/2021 by text page via the [facility] messaging system. Electronically Signed   By: Deatra RobinsonKevin  Herman M.D.   On: 09/01/2021 19:43    Procedures .Critical Care  Performed by: Janell Quietonklin, Darnice Comrie R, PA-C Authorized by: Janell Quietonklin, Wenona Mayville R, PA-C   Critical care provider statement:    Critical care time (minutes):  30   Critical care start time:  11/22/2021 10:00 PM   Critical care end time:  11/22/2021 10:30 PM   Critical care was necessary to treat or prevent imminent or life-threatening deterioration of the following conditions:  CNS failure or compromise   Critical care was time spent personally by me on the following activities:  Development of treatment plan with patient or surrogate,  discussions with consultants, evaluation of patient's response to treatment, examination of patient, ordering and review of laboratory studies, ordering and review of radiographic studies, ordering and performing treatments and interventions, pulse oximetry, re-evaluation of patient's condition and review of old charts   I assumed direction of critical care for this patient from another provider in my specialty: no     Care discussed with: admitting provider       Medications Ordered in ED Medications  iohexol (OMNIPAQUE) 350 MG/ML injection 75 mL (75 mLs Intravenous Contrast Given 09/01/21 1957)  iohexol (OMNIPAQUE) 350 MG/ML injection 40 mL (40 mLs Intravenous Contrast Given 09/01/21 2037)  LORazepam (ATIVAN) injection 0.5 mg (0.5 mg Intravenous Given 09/01/21 2320)    ED Course/ Medical Decision Making/ A&P Clinical Course as of 09/01/21 2328  Mon Sep 01, 2021  2312 CT ANGIO HEAD NECK W WO CM (CODE STROKE) [EC]    Clinical Course User Index [EC] Janell Quietonklin, Lillyian Heidt R, PA-C                           Medical Decision Making Amount and/or Complexity of Data Reviewed Labs: ordered. Radiology: ordered. Decision-making details documented in ED Course.  Risk Prescription drug management. Decision regarding hospitalization.   Social determinants of health:  Social History   Socioeconomic History   Marital status: Single    Spouse name: Not on file   Number of children: Not on file   Years of education: Not on file   Highest education level: Not on file  Occupational History   Not on file  Tobacco Use   Smoking status: Never   Smokeless tobacco: Not on file  Substance and Sexual Activity   Alcohol use: No   Drug use: No   Sexual activity: Not Currently  Other Topics Concern   Not on file  Social History Narrative   Not on file   Social Determinants of Health   Financial Resource Strain: Not on file  Food Insecurity: Not on file  Transportation Needs: Not on file   Physical Activity: Not on file  Stress: Not on file  Social Connections: Not on file  Intimate Partner Violence: Not on file     Initial impression:  This patient presents to the ED for concern of ataxia, slurred speech and eye deviation, this involves an extensive number of treatment options, and is a complaint that carries with it a high risk of complications and morbidity.   Differentials include CVA, ICB, Bells palsy, delirium.   Comorbidities affecting care:  Per HPI  Additional history obtained: Fiance  Lab Tests  I Ordered, reviewed, and interpreted labs and EKG.  The  pertinent results include:  CMP normal   Imaging Studies ordered:  I ordered imaging studies including  CT head without acute findings CT angio head and neck concerning for possible occlusion of the left anterior cerebral artery CT cerebral perfusion negative I independently visualized and interpreted imaging and I agree with the radiologist interpretation.   EKG: NSR  Cardiac Monitoring:  The patient was maintained on a cardiac monitor.  I personally viewed and interpreted the cardiac monitored which showed an underlying rhythm of: NSR   Medicines ordered and prescription drug management:  I ordered medication including: Ativan 0.5mg  for MRI anxiety   I have reviewed the patients home medicines and have made adjustments as needed    ED Course/Re-evaluation: Presents in no acute distress with concern for stroke symptoms. Severely hypertensive but vitals otherwise without acute abnormality. On exam, gaze deviated to the right. Speech slightly slurred and slowed. Poor finger to nose coordination, no pronator drift. Code stroke was called based on these symptoms and CT head was negative. CTA head and neck was also ordered which was concerned for possible LCA occlusion. Dr. Derry Lory evaluated the patient and ordered cerebral perfusion scan to better evluation for possible LCA occlusion. However, he  still had concern for underlying stroke, so MR brain was also ordered. Labs fairly unremarkable aside from mild AKI and dehydration noted on UA.  MR Brain pending at time of shift change. Care handed off to [facility] who will monitor for MR results and consult with neurology when these return.    Final Clinical Impression(s) / ED Diagnoses Final diagnoses:  None    Rx / DC Orders ED Discharge Orders     None         Delight Ovens 09/01/21 2352    Gloris Manchester, MD 09/02/21 2306    Janell Quiet, PA-C 11/22/21 7591    Gloris Manchester, MD 11/25/21 [phone]

## 2021-09-01 NOTE — ED Notes (Signed)
Shawn Austin, ED PA at bedside.

## 2021-09-01 NOTE — ED Notes (Signed)
Conklin PA made aware of pts BP 202/107

## 2021-09-01 NOTE — ED Triage Notes (Signed)
Pts fiance, reports he has been leaning to the side since waking up today around 10. Pt went to bed around 11 pm. Pt vomiting in triage. Symptoms started yesterday with right leg. Pt does have facial droop and fiance reports he has that from bells palsy.

## 2021-09-01 NOTE — ED Provider Triage Note (Signed)
Emergency Medicine Provider Triage Evaluation Note  WAYMOND MEADOR , a 56 y.o. male  was evaluated in triage.  Family member reports that his body was leaning more to the right since 0900/1000 today. Reports his speech started slurring just shortly ago.   Review of Systems  Positive:  Negative:   Physical Exam  BP (!) 169/108   Pulse 97   Temp 98.1 F (36.7 C) (Oral)   Resp 16   Ht 5\' 9"  (1.753 m)   Wt 86.2 kg   SpO2 99%   BMI 28.06 kg/m  Gen:   Awake, no distress   Resp:  Normal effort  MSK:   Moves extremities without difficulty  Other:  Abnormal finger to nose. Horizontal nystagmus. Slight right sided facial droop. Strength intact throughout.   Medical Decision Making  Medically screening exam initiated at 5:35 PM.  Appropriate orders placed.  ASHTAN GIRTMAN was informed that the remainder of the evaluation will be completed by another provider, this initial triage assessment does not replace that evaluation, and the importance of remaining in the ED until their evaluation is complete.  Out of window. Did not assess patient till he has been back for 2 hours and 30 minutes. CT imaging and labs ordered.    [name], PA-C 09/01/21 1744

## 2021-09-01 NOTE — ED Notes (Signed)
Pt transported to CT with this RN 

## 2021-09-01 NOTE — ED Notes (Signed)
BP 195/114, neurologist recommends permissive HTN below 220 systolic

## 2021-09-01 NOTE — ED Notes (Signed)
Patient transported to MRI 

## 2021-09-01 NOTE — Consult Note (Signed)
NEUROLOGY CONSULTATION NOTE   Date of service: September 01, 2021 Patient Name: Shawn Austin MRN:  [id number] DOB:  April 23, 1965 Reason for consult: "Code stroke for R gaze preference and dysequilibrium" Requesting Provider: Gloris Manchester, MD _ _ _   _ __   _ __ _ _  __ __   _ __   __ _  History of Present Illness  Shawn Austin is a 56 y.o. male with PMH significant for DM2, HTN, HLD, degenerative spinal disc disease, who presents with leaning to the left and eyes deviated to the right and feeling off balance with falls over the last few days.  Patient reports that he went to bed on Saturday around 2100 and woke up Sunday AM and felt like he was walking to his left when going to the bathroom.  He reports that in the afternoon, he fell while attempting to walk.  He reports that he has had a couple falls since then including in the morning today.  He reports that he felt that his speech was slightly slurred on Sunday.  He reports that today in the afternoon, his sensation of disequilibrium has worsened, his speech is more slurry and he feels very incoordinated.  LKW: 2100 on 08/30/2021. mRS: 0 tNKASE: Not offered due to patient outside window. Thrombectomy: Not offered due to low suspicion for an LVO. NIHSS components Score: Comment  1a Level of Conscious 0[x] 1[] 2[] 3[]     1b LOC Questions 0[x] 1[] 2[]      1c LOC Commands 0[x] 1[] 2[]      2 Best Gaze 0[] 1[x] 2[]      3 Visual 0[x] 1[] 2[] 3[]     4 Facial Palsy 0[x] 1[] 2[] 3[]     5a Motor Arm - left 0[x] 1[] 2[] 3[] 4[] UN[]   5b Motor Arm - Right 0[x] 1[] 2[] 3[] 4[] UN[]   6a Motor Leg - Left 0[x] 1[] 2[] 3[] 4[] UN[]   6b Motor Leg - Right 0[x] 1[] 2[] 3[] 4[] UN[]   7 Limb Ataxia 0[] 1[] 2[x] 3[] UN[]    8 Sensory 0[x] 1[] 2[] UN[]     9 Best Language 0[x] 1[] 2[] 3[]     10 Dysarthria 0[] 1[x] 2[] UN[]     11  Extinct. and Inattention 0[x]  1[]  2[]       TOTAL: 4      ROS   Constitutional Denies weight loss, fever and  chills.   HEENT Denies changes in vision and hearing.   Respiratory Denies SOB and cough.   CV Denies palpitations and CP   GI Denies abdominal pain, nausea, vomiting and diarrhea.   GU Denies dysuria and urinary frequency.   MSK Denies myalgia and joint pain.   Skin Denies rash and pruritus.   Neurological Denies headache and syncope.   Psychiatric Denies recent changes in mood. Denies anxiety and depression.    Past History   Past Medical History:  Diagnosis Date   Anemia    Articular cartilage disorder of hip    Cerebral ischemia    Congenital deformity of right hip joint    Degeneration of cervical intervertebral disc    Diabetes mellitus without complication (HCC)    type 2   Fatigue    Hyperglycemia due to diabetes mellitus (HCC)    Hyperlipidemia    Hypertension    Hypocalcemia    Hyponatremia    Lower limb pain, inferior, right  Mild cognitive disorder    Pain in joint of left knee    Recurrent falls    Spondylolisthesis of lumbar region    Spondylolisthesis, lumbar region    History reviewed. No pertinent surgical history. No family history on file. Social History   Socioeconomic History   Marital status: Single    Spouse name: Not on file   Number of children: Not on file   Years of education: Not on file   Highest education level: Not on file  Occupational History   Not on file  Tobacco Use   Smoking status: Never   Smokeless tobacco: Not on file  Substance and Sexual Activity   Alcohol use: No   Drug use: No   Sexual activity: Not Currently  Other Topics Concern   Not on file  Social History Narrative   Not on file   Social Determinants of Health   Financial Resource Strain: Not on file  Food Insecurity: Not on file  Transportation Needs: Not on file  Physical Activity: Not on file  Stress: Not on file  Social Connections: Not on file   No Known Allergies  Medications  (Not in a hospital admission)    Vitals   Vitals:    09/01/21 1900 09/01/21 1915 09/01/21 2004 09/01/21 2015  BP: (!) 192/102 (!) 205/106 (!) 202/107 (!) 188/110  Pulse: 84 81 89 85  Resp: 12 12 13 12   Temp:      TempSrc:      SpO2: 100% 100% 100% 99%  Weight:      Height:         Body mass index is 28.06 kg/m.  Physical Exam   General: Laying comfortably in bed; in no acute distress.  HENT: Normal oropharynx and mucosa. Normal external appearance of ears and nose.  Neck: Supple, no pain or tenderness  CV: No JVD. No peripheral edema.  Pulmonary: Symmetric Chest rise. Normal respiratory effort.  Abdomen: Soft to touch, non-tender.  Ext: No cyanosis, edema, or deformity  Skin: No rash. Normal palpation of skin.   Musculoskeletal: Normal digits and nails by inspection. No clubbing.   Neurologic Examination  Mental status/Cognition: Alert, oriented to self, place, month and year, good attention.  Speech/language: Dysarthric speech, fluent, comprehension intact, object naming intact, repetition intact.  Cranial nerves:   CN II Pupils equal and reactive to light, no VF deficits    CN III,IV,VI EOM intact, mild R gaze preference but does cross midline. No gaze deviation, no nystagmus    CN V normal sensation in V1, V2, and V3 segments bilaterally    CN VII no asymmetry, no nasolabial fold flattening    CN VIII normal hearing to speech    CN IX & X normal palatal elevation, no uvular deviation    CN XI 5/5 head turn and 5/5 shoulder shrug bilaterally    CN XII midline tongue protrusion    Motor:  Muscle bulk: normal , tone normal, pronator drift none tremor none Mvmt Root Nerve  Muscle Right Left Comments  SA C5/6 Ax Deltoid 5 5   EF C5/6 Mc Biceps 5 5   EE C6/7/8 Rad Triceps 5 5   WF C6/7 Med FCR     WE C7/8 PIN ECU     F Ab C8/T1 U ADM/FDI 5 5   HF L1/2/3 Fem Illopsoas 5 5   KE L2/3/4 Fem Quad 5 5   DF L4/5 D Peron Tib Ant 5 5   PF  S1/2 Tibial Grc/Sol 5 5    Reflexes:  Right Left Comments  Pectoralis      Biceps  (C5/6) 2 2   Brachioradialis (C5/6) 2 2    Triceps (C6/7) 2 2    Patellar (L3/4) 2 2    Achilles (S1)      Hoffman      Plantar     Jaw jerk    Sensation:  Light touch Intact throughout   Pin prick    Temperature    Vibration   Proprioception    Coordination/Complex Motor:  - Finger to Nose with ataxia BL - Heel to shin with ataxia BL - Rapid alternating movement are normal - Gait: Deferred.  Labs   CBC:  Recent Labs  Lab 09/01/21 1802  HGB 13.6  HCT 40.0    Basic Metabolic Panel:  Lab Results  Component Value Date   NA 139 09/01/2021   K 4.3 09/01/2021   CO2 24 09/01/2021   GLUCOSE 199 (H) 09/01/2021   BUN 10 09/01/2021   CREATININE 0.90 09/01/2021   CALCIUM 9.1 09/01/2021   GFRNONAA >60 09/01/2021   Lipid Panel:  Lab Results  Component Value Date   LDLCALC 119 (H) 04/14/2012   HgbA1c:  Lab Results  Component Value Date   HGBA1C 11.2 06/27/2012   Urine Drug Screen: No results found for: "LABOPIA", "COCAINSCRNUR", "LABBENZ", "AMPHETMU", "THCU", "LABBARB"  Alcohol Level     Component Value Date/Time   ETH <10 09/01/2021 1755    CT Head without contrast(Personally reviewed): CTH was negative for a large hypodensity concerning for a large territory infarct or hyperdensity concerning for an ICH  CT angio Head and Neck with contrast(Personally reviewed): Multifocal multivessel stenosis with ? L ACA A1 segment occlusion  CT Perfusion: No mismatch in the L ACA distribution and thus unlikely for this to be an acute L ACA occlusion. 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  MRI Brain: pending  Impression   CLEOPHAS YOAK is a 56 y.o. male with PMH significant for DM2, HTN, HLD, degenerative spinal disc disease, who presents with leaning to the left and R gaze preference and feeling off balance with falls over the last few days. Neuro exam with ataxia, left worse than right along with slurred speech. Highest suspicion for a small vessel stroke in the posterior  circulation.  Primary Diagnosis:  Other cerebral infarction due to occlusion of stenosis of small artery.  Secondary Diagnosis: Essential (primary) hypertension and Type 2 diabetes mellitus w/o complications  Recommendations    - Frequent Neuro checks per stroke unit protocol - Recommend brain imaging with MRI Brain without contrast - Recommend obtaining TTE  - Recommend obtaining Lipid panel with LDL - Please start statin if LDL > 70 - Recommend HbA1c - Antithrombotic - Aspirin 81mg  daily along with plavix 75mg  daily x 21 days, followed by Aspirin 81mg  daily alone. - Recommend DVT ppx - SBP goal - permissive hypertension first 24 h < 220/110. Held home meds.  - Recommend Telemetry monitoring for arrythmia - Recommend bedside swallow screen prior to PO intake. - Stroke education booklet - Recommend PT/OT/SLP consult - Recommend Urine Tox screen.  ______________________________________________________________________   Thank you for the opportunity to take part in the care of this patient. If you have any further questions, please contact the neurology consultation attending.  Signed,  [facility] Pager Number _ _ _   _ __   _ __ _ _  __ __  _ __   __ _  

## 2021-09-02 ENCOUNTER — Encounter (HOSPITAL_COMMUNITY): Payer: Self-pay | Admitting: Internal Medicine

## 2021-09-02 ENCOUNTER — Other Ambulatory Visit: Payer: Self-pay

## 2021-09-02 DIAGNOSIS — Z20822 Contact with and (suspected) exposure to covid-19: Secondary | ICD-10-CM | POA: Diagnosis present

## 2021-09-02 DIAGNOSIS — Z91148 Patient's other noncompliance with medication regimen for other reason: Secondary | ICD-10-CM | POA: Diagnosis not present

## 2021-09-02 DIAGNOSIS — B370 Candidal stomatitis: Secondary | ICD-10-CM | POA: Diagnosis not present

## 2021-09-02 DIAGNOSIS — R270 Ataxia, unspecified: Secondary | ICD-10-CM | POA: Diagnosis present

## 2021-09-02 DIAGNOSIS — K5900 Constipation, unspecified: Secondary | ICD-10-CM | POA: Diagnosis not present

## 2021-09-02 DIAGNOSIS — R32 Unspecified urinary incontinence: Secondary | ICD-10-CM | POA: Diagnosis not present

## 2021-09-02 DIAGNOSIS — H539 Unspecified visual disturbance: Secondary | ICD-10-CM | POA: Diagnosis not present

## 2021-09-02 DIAGNOSIS — E876 Hypokalemia: Secondary | ICD-10-CM | POA: Diagnosis not present

## 2021-09-02 DIAGNOSIS — K3184 Gastroparesis: Secondary | ICD-10-CM | POA: Diagnosis not present

## 2021-09-02 DIAGNOSIS — I10 Essential (primary) hypertension: Secondary | ICD-10-CM

## 2021-09-02 DIAGNOSIS — R7989 Other specified abnormal findings of blood chemistry: Secondary | ICD-10-CM | POA: Diagnosis not present

## 2021-09-02 DIAGNOSIS — I4510 Unspecified right bundle-branch block: Secondary | ICD-10-CM | POA: Diagnosis present

## 2021-09-02 DIAGNOSIS — E1169 Type 2 diabetes mellitus with other specified complication: Secondary | ICD-10-CM | POA: Diagnosis not present

## 2021-09-02 DIAGNOSIS — N179 Acute kidney failure, unspecified: Secondary | ICD-10-CM | POA: Diagnosis not present

## 2021-09-02 DIAGNOSIS — E785 Hyperlipidemia, unspecified: Secondary | ICD-10-CM

## 2021-09-02 DIAGNOSIS — Z597 Insufficient social insurance and welfare support: Secondary | ICD-10-CM | POA: Diagnosis not present

## 2021-09-02 DIAGNOSIS — G463 Brain stem stroke syndrome: Secondary | ICD-10-CM | POA: Diagnosis present

## 2021-09-02 DIAGNOSIS — I63411 Cerebral infarction due to embolism of right middle cerebral artery: Secondary | ICD-10-CM | POA: Diagnosis not present

## 2021-09-02 DIAGNOSIS — R471 Dysarthria and anarthria: Secondary | ICD-10-CM | POA: Diagnosis present

## 2021-09-02 DIAGNOSIS — E669 Obesity, unspecified: Secondary | ICD-10-CM | POA: Diagnosis not present

## 2021-09-02 DIAGNOSIS — T465X6A Underdosing of other antihypertensive drugs, initial encounter: Secondary | ICD-10-CM | POA: Diagnosis present

## 2021-09-02 DIAGNOSIS — E119 Type 2 diabetes mellitus without complications: Secondary | ICD-10-CM

## 2021-09-02 DIAGNOSIS — I639 Cerebral infarction, unspecified: Secondary | ICD-10-CM | POA: Diagnosis not present

## 2021-09-02 DIAGNOSIS — W19XXXA Unspecified fall, initial encounter: Secondary | ICD-10-CM | POA: Diagnosis present

## 2021-09-02 DIAGNOSIS — H518 Other specified disorders of binocular movement: Secondary | ICD-10-CM | POA: Diagnosis present

## 2021-09-02 DIAGNOSIS — E1165 Type 2 diabetes mellitus with hyperglycemia: Secondary | ICD-10-CM | POA: Diagnosis present

## 2021-09-02 DIAGNOSIS — R339 Retention of urine, unspecified: Secondary | ICD-10-CM | POA: Diagnosis not present

## 2021-09-02 DIAGNOSIS — Z83511 Family history of glaucoma: Secondary | ICD-10-CM | POA: Diagnosis not present

## 2021-09-02 DIAGNOSIS — I69354 Hemiplegia and hemiparesis following cerebral infarction affecting left non-dominant side: Secondary | ICD-10-CM | POA: Diagnosis not present

## 2021-09-02 DIAGNOSIS — R9431 Abnormal electrocardiogram [ECG] [EKG]: Secondary | ICD-10-CM

## 2021-09-02 DIAGNOSIS — I340 Nonrheumatic mitral (valve) insufficiency: Secondary | ICD-10-CM | POA: Diagnosis not present

## 2021-09-02 DIAGNOSIS — I6782 Cerebral ischemia: Secondary | ICD-10-CM | POA: Diagnosis present

## 2021-09-02 DIAGNOSIS — R29704 NIHSS score 4: Secondary | ICD-10-CM | POA: Diagnosis present

## 2021-09-02 DIAGNOSIS — I160 Hypertensive urgency: Secondary | ICD-10-CM | POA: Diagnosis present

## 2021-09-02 DIAGNOSIS — I6389 Other cerebral infarction: Secondary | ICD-10-CM | POA: Diagnosis not present

## 2021-09-02 DIAGNOSIS — Z7984 Long term (current) use of oral hypoglycemic drugs: Secondary | ICD-10-CM | POA: Diagnosis not present

## 2021-09-02 DIAGNOSIS — D649 Anemia, unspecified: Secondary | ICD-10-CM | POA: Diagnosis present

## 2021-09-02 DIAGNOSIS — I6381 Other cerebral infarction due to occlusion or stenosis of small artery: Secondary | ICD-10-CM | POA: Diagnosis present

## 2021-09-02 DIAGNOSIS — R482 Apraxia: Secondary | ICD-10-CM | POA: Diagnosis not present

## 2021-09-02 DIAGNOSIS — I4891 Unspecified atrial fibrillation: Secondary | ICD-10-CM | POA: Diagnosis not present

## 2021-09-02 LAB — CBC WITH DIFFERENTIAL/PLATELET
Abs Immature Granulocytes: 0.01 10*3/uL (ref 0.00–0.07)
Abs Immature Granulocytes: 0.01 10*3/uL (ref 0.00–0.07)
Basophils Absolute: 0 10*3/uL (ref 0.0–0.1)
Basophils Absolute: 0 10*3/uL (ref 0.0–0.1)
Basophils Relative: 0 %
Basophils Relative: 0 %
Eosinophils Absolute: 0 10*3/uL (ref 0.0–0.5)
Eosinophils Absolute: 0 10*3/uL (ref 0.0–0.5)
Eosinophils Relative: 1 %
Eosinophils Relative: 1 %
HCT: 39.2 % (ref 39.0–52.0)
HCT: 39.7 % (ref 39.0–52.0)
Hemoglobin: 13 g/dL (ref 13.0–17.0)
Hemoglobin: 13.5 g/dL (ref 13.0–17.0)
Immature Granulocytes: 0 %
Immature Granulocytes: 0 %
Lymphocytes Relative: 26 %
Lymphocytes Relative: 34 %
Lymphs Abs: 1.3 10*3/uL (ref 0.7–4.0)
Lymphs Abs: 1.8 10*3/uL (ref 0.7–4.0)
MCH: 28.3 pg (ref 26.0–34.0)
MCH: 28.4 pg (ref 26.0–34.0)
MCHC: 33.2 g/dL (ref 30.0–36.0)
MCHC: 34 g/dL (ref 30.0–36.0)
MCV: 85.2 fL (ref 80.0–100.0)
MCV: 83.6 fL (ref 80.0–100.0)
Monocytes Absolute: 0.2 10*3/uL (ref 0.1–1.0)
Monocytes Absolute: 0.4 10*3/uL (ref 0.1–1.0)
Monocytes Relative: 4 %
Monocytes Relative: 8 %
Neutro Abs: 3.5 10*3/uL (ref 1.7–7.7)
Neutro Abs: 3.2 10*3/uL (ref 1.7–7.7)
Neutrophils Relative %: 69 %
Neutrophils Relative %: 57 %
Platelets: 408 10*3/uL — ABNORMAL HIGH (ref 150–400)
Platelets: 399 10*3/uL (ref 150–400)
RBC: 4.6 MIL/uL (ref 4.22–5.81)
RBC: 4.75 MIL/uL (ref 4.22–5.81)
RDW: 13.2 % (ref 11.5–15.5)
RDW: 13 % (ref 11.5–15.5)
WBC: 5.1 10*3/uL (ref 4.0–10.5)
WBC: 5.5 10*3/uL (ref 4.0–10.5)
nRBC: 0 % (ref 0.0–0.2)
nRBC: 0 % (ref 0.0–0.2)

## 2021-09-02 LAB — COMPREHENSIVE METABOLIC PANEL
ALT: 13 U/L (ref 0–44)
AST: 17 U/L (ref 15–41)
Albumin: 3.4 g/dL — ABNORMAL LOW (ref 3.5–5.0)
Alkaline Phosphatase: 87 U/L (ref 38–126)
Anion gap: 4 — ABNORMAL LOW (ref 5–15)
BUN: 8 mg/dL (ref 6–20)
CO2: 26 mmol/L (ref 22–32)
Calcium: 8.9 mg/dL (ref 8.9–10.3)
Chloride: 108 mmol/L (ref 98–111)
Creatinine, Ser: 0.86 mg/dL (ref 0.61–1.24)
GFR, Estimated: >60 mL/min (ref >60)
Glucose, Bld: 150 mg/dL — ABNORMAL HIGH (ref 70–99)
Potassium: 3.7 mmol/L (ref 3.5–5.1)
Sodium: 138 mmol/L (ref 135–145)
Total Bilirubin: 1.2 mg/dL (ref 0.3–1.2)
Total Protein: 6.7 g/dL (ref 6.5–8.1)

## 2021-09-02 LAB — LIPID PANEL
Cholesterol: 232 mg/dL — ABNORMAL HIGH (ref 0–200)
HDL: 50 mg/dL (ref >40)
LDL Cholesterol: 169 mg/dL — ABNORMAL HIGH (ref 0–99)
Total CHOL/HDL Ratio: 4.6 RATIO
Triglycerides: 65 mg/dL (ref <150)
VLDL: 13 mg/dL (ref 0–40)

## 2021-09-02 LAB — MAGNESIUM: Magnesium: 2 mg/dL (ref 1.7–2.4)

## 2021-09-02 LAB — HEMOGLOBIN A1C
Hgb A1c MFr Bld: 8.1 % — ABNORMAL HIGH (ref 4.8–5.6)
Mean Plasma Glucose: 185.77 mg/dL

## 2021-09-02 LAB — CBG MONITORING, ED
Glucose-Capillary: 179 mg/dL — ABNORMAL HIGH (ref 70–99)
Glucose-Capillary: 172 mg/dL — ABNORMAL HIGH (ref 70–99)
Glucose-Capillary: 157 mg/dL — ABNORMAL HIGH (ref 70–99)
Glucose-Capillary: 187 mg/dL — ABNORMAL HIGH (ref 70–99)

## 2021-09-02 MED ORDER — INSULIN ASPART 100 UNIT/ML IJ SOLN
0.0000 [IU] | Freq: Four times a day (QID) | INTRAMUSCULAR | Status: DC
  Administered 2021-09-02 – 2021-09-03 (×3): 1 [IU] via SUBCUTANEOUS
  Administered 2021-09-03: 4 [IU] via SUBCUTANEOUS
  Administered 2021-09-04: 2 [IU] via SUBCUTANEOUS
  Administered 2021-09-04 (×2): 1 [IU] via SUBCUTANEOUS
  Administered 2021-09-04 (×2): 2 [IU] via SUBCUTANEOUS
  Administered 2021-09-05: 1 [IU] via SUBCUTANEOUS
  Administered 2021-09-05: 3 [IU] via SUBCUTANEOUS
  Administered 2021-09-05: 2 [IU] via SUBCUTANEOUS
  Administered 2021-09-05 – 2021-09-06 (×2): 1 [IU] via SUBCUTANEOUS
  Administered 2021-09-06: 3 [IU] via SUBCUTANEOUS
  Administered 2021-09-07 – 2021-09-08 (×3): 1 [IU] via SUBCUTANEOUS
  Administered 2021-09-08: 2 [IU] via SUBCUTANEOUS

## 2021-09-02 MED ORDER — STROKE: EARLY STAGES OF RECOVERY BOOK: Freq: Once | Status: AC

## 2021-09-02 MED ORDER — ASPIRIN 81 MG PO CHEW
81.0000 mg | CHEWABLE_TABLET | Freq: Every day | ORAL | Status: DC
  Administered 2021-09-03 – 2021-09-09 (×7): 81 mg via ORAL
  Filled 2021-09-02 (×8): qty 1

## 2021-09-02 MED ORDER — LABETALOL HCL 5 MG/ML IV SOLN
10.0000 mg | INTRAVENOUS | Status: DC | PRN
  Administered 2021-09-02 – 2021-09-06 (×7): 10 mg via INTRAVENOUS
  Filled 2021-09-02 (×7): qty 4

## 2021-09-02 MED ORDER — ATORVASTATIN CALCIUM 80 MG PO TABS
80.0000 mg | ORAL_TABLET | Freq: Every day | ORAL | Status: DC
  Administered 2021-09-02 – 2021-09-10 (×9): 80 mg via ORAL
  Filled 2021-09-02 (×11): qty 1

## 2021-09-02 MED ORDER — CLOPIDOGREL BISULFATE 75 MG PO TABS
75.0000 mg | ORAL_TABLET | Freq: Every day | ORAL | Status: DC
  Administered 2021-09-02 – 2021-09-11 (×10): 75 mg via ORAL
  Filled 2021-09-02 (×10): qty 1

## 2021-09-02 MED ORDER — ASPIRIN 81 MG PO CHEW
81.0000 mg | CHEWABLE_TABLET | Freq: Once | ORAL | Status: AC
  Administered 2021-09-02: 81 mg via ORAL
  Filled 2021-09-02: qty 1

## 2021-09-02 MED ORDER — ACETAMINOPHEN 650 MG RE SUPP: 650.0000 mg | Freq: Four times a day (QID) | RECTAL | Status: DC | PRN

## 2021-09-02 MED ORDER — LORAZEPAM 2 MG/ML IJ SOLN
0.5000 mg | Freq: Four times a day (QID) | INTRAMUSCULAR | Status: DC | PRN
  Administered 2021-09-02: 0.5 mg via INTRAVENOUS
  Filled 2021-09-02: qty 1

## 2021-09-02 MED ORDER — ACETAMINOPHEN 325 MG PO TABS
650.0000 mg | ORAL_TABLET | Freq: Four times a day (QID) | ORAL | Status: DC | PRN
  Administered 2021-09-07: 650 mg via ORAL
  Filled 2021-09-02: qty 2

## 2021-09-02 NOTE — H&P (Signed)
History and Physical    PLEASE NOTE THAT DRAGON [employer] SOFTWARE WAS USED IN THE CONSTRUCTION OF THIS NOTE.   EPIC NOLAND [id number] DOB: December 05, 1965 DOA: 09/01/2021  PCP: Bobbye Riggs, FNP  Patient coming from: home   I have personally briefly reviewed patient's old medical records in [place]  Chief Complaint: Disequilibrium  HPI: Shawn Austin is a 56 y.o. male with medical history significant for type 2 diabetes mellitus, hypertension, hyperlipidemia who is admitted to [facility] on 09/01/2021 with acute ischemic stroke after presenting from home to [facility] ED complaining of disequilibrium.   The patient conveys that he went to sleep at 2100 on 08/29/2021 completely asymptomatic, before awakening on the morning of 08/30/2021 with new onset disequilibrium, which has been associated with new onset slurred speech, with persistence of his symptoms ultimately prompting the patient to present to [facility] emergency department on 09/01/2021 for further evaluation and management thereof.  Denies any associated acute focal weakness nor any acute focal numbness, paresthesias, nausea, vomiting, acute change in vision, or headache.  He also denies any associated chest pain, shortness of breath, palpitations, diaphoresis, presyncope, or syncope.  Denies any known previous history of stroke.  Medical history notable for type 2 diabetes mellitus, with most recent hemoglobin A1c noted to be 11.2% in May 2014.  Not on any exogenous insulin as an outpatient, but rather on multiple oral hypoglycemic agents.  Also has history of essential hypertension as well as hyperlipidemia, noting that the latter is managed via lifestyle modifications in the absence of any antilipid medications at home.  Denies any known history of paroxysmal atrial fibrillation nor any known history of obstructive sleep apnea.  Conveys that he is a lifelong non-smoker.  Not on any blood thinners as an  outpatient, including no aspirin.     ED Course:  Vital signs in the ED were notable for the following: Afebrile; heart rate [id number]; systolic blood pressures in the 150s to 170s; respiratory rate 14-19, oxygen saturation 99 to 100% on room air.  Labs were notable for the following: CMP, which showed creatinine 0.97, glucose 198.  Serum ethanol level less than 10.  Urinary drug screen pan negative.  Imaging and additional notable ED work-up: EKG showed sinus rhythm with right bundle branch block, heart rate 84, and no evidence of T wave or ST changes, including no evidence of ST elevation.  Noncontrast CT head showed no evidence of acute intracranial process, including no evidence of intracranial hemorrhage or any evidence of acute infarct.  CTA head and neck showed no evidence of large vessel occlusion or hemodynamically significant stenosis.  CTA head was read as demonstrating variant anterior cerebral anatomy, with a diminutive left anterior cerebral artery, with concern for acute occlusion of the left anterior cerebral artery.  This prompted pursuit of a CT cerebral perfusion scan, which subsequently showed no evidence of left anterior cerebral artery occlusion.  MRI brain showed small focus of acute ischemia at the right dorsal lateral right medulla oblongata without associated hemorrhage or mass effect.  The patient's case and imaging were discussed with the on-call neurologist, Dr. Lorrin Goodell, Who has formally consulted and recommends admission to the hospital service for further evaluation and management of presenting acute ischemic stroke. Dr. Lorrin Goodell Also recommends initiation of dual antiplatelet therapy.   While in the ED, the following were administered: Ativan 0.5 mg IV x1  Subsequently, the patient was admitted for further evaluation and management of presenting acute ischemic  stroke.    Review of Systems: As per HPI otherwise 10 point review of systems negative.   Past  Medical History:  Diagnosis Date   Anemia    Articular cartilage disorder of hip    Cerebral ischemia    Congenital deformity of right hip joint    Degeneration of cervical intervertebral disc    Diabetes mellitus without complication (HCC)    type 2   Fatigue    Hyperglycemia due to diabetes mellitus (HCC)    Hyperlipidemia    Hypertension    Hypocalcemia    Hyponatremia    Lower limb pain, inferior, right    Mild cognitive disorder    Pain in joint of left knee    Recurrent falls    Spondylolisthesis of lumbar region    Spondylolisthesis, lumbar region     History reviewed. No pertinent surgical history.  Social History:  reports that he has never smoked. He does not have any smokeless tobacco history on file. He reports that he does not drink alcohol and does not use drugs.   No Known Allergies  History reviewed. No pertinent family history.   Prior to Admission medications   Medication Sig Start Date End Date Taking? Authorizing Provider  glipiZIDE (GLUCOTROL) 10 MG tablet Take 10 mg by mouth daily before breakfast. Patient not taking: Reported on 09/01/2021    [provider]  metFORMIN (GLUCOPHAGE) 500 MG tablet Take 1 tablet (500 mg total) by mouth 2 (two) times daily with a meal. Patient not taking: Reported on 09/01/2021 05/26/18   Georgina Quint, MD  metoprolol succinate (TOPROL-XL) 50 MG 24 hr tablet Take 1 tablet (50 mg total) by mouth daily. Take with or immediately following a meal. Patient not taking: Reported on 09/01/2021 05/26/18   Georgina Quint, MD     Objective    Physical Exam: Vitals:   09/02/21 0515 09/02/21 0530 09/02/21 0545 09/02/21 0600  BP: (!) 182/111 (!) 150/114 (!) 171/106 (!) 166/109  Pulse: 89 84 81 80  Resp: 11 (!) 24 12 18   Temp:      TempSrc:      SpO2: 100% 100% 100% 99%  Weight:      Height:        General: appears to be stated age; alert, oriented Skin: warm, dry, no rash Head:  AT/ Mouth:  Oral  mucosa membranes appear moist, normal dentition Neck: supple; trachea midline Heart:  RRR; did not appreciate any M/R/G Lungs: CTAB, did not appreciate any wheezes, rales, or rhonchi Abdomen: + BS; soft, ND, NT Vascular: 2+ pedal pulses b/l; 2+ radial pulses b/l Extremities: no peripheral edema, no muscle wasting Neuro: strength and sensation intact in upper and lower extremities b/l; finger-to-nose and heel-to-shin associated with ataxia, bilaterally, dysarthria noted, normal muscle tone, no tremors.   Labs on Admission: I have personally reviewed following labs and imaging studies  CBC: Recent Labs  Lab 09/01/21 1755 09/01/21 1802 09/02/21 0427  WBC 5.1  --  5.5  NEUTROABS 3.5  --  3.2  HGB 13.0 13.6 13.5  HCT 39.2 40.0 39.7  MCV 85.2  --  83.6  PLT 408*  --  399   Basic Metabolic Panel: Recent Labs  Lab 09/01/21 1755 09/01/21 1802 09/02/21 0427  NA 137 139 138  K 4.2 4.3 3.7  CL 102 105 108  CO2 24  --  26  GLUCOSE 198* 199* 150*  BUN 9 10 8   CREATININE 0.97 0.90 0.86  CALCIUM 9.1  --  8.9  MG  --   --  2.0   GFR: Estimated Creatinine Clearance: 104.3 mL/min (by C-G formula based on SCr of 0.86 mg/dL). Liver Function Tests: Recent Labs  Lab 09/01/21 1755 09/02/21 0427  AST 16 17  ALT 12 13  ALKPHOS 93 87  BILITOT 1.2 1.2  PROT 6.9 6.7  ALBUMIN 3.6 3.4*   No results for input(s): "LIPASE", "AMYLASE" in the last 168 hours. No results for input(s): "AMMONIA" in the last 168 hours. Coagulation Profile: Recent Labs  Lab 09/01/21 1755  INR 1.1   Cardiac Enzymes: No results for input(s): "CKTOTAL", "CKMB", "CKMBINDEX", "TROPONINI" in the last 168 hours. BNP (last 3 results) No results for input(s): "PROBNP" in the last 8760 hours. HbA1C: Recent Labs    09/02/21 0427  HGBA1C 8.1*   CBG: Recent Labs  Lab 09/01/21 1658  GLUCAP 190*   Lipid Profile: Recent Labs    09/02/21 0427  CHOL 232*  HDL 50  LDLCALC 169*  TRIG 65  CHOLHDL 4.6    Thyroid Function Tests: No results for input(s): "TSH", "T4TOTAL", "FREET4", "T3FREE", "THYROIDAB" in the last 72 hours. Anemia Panel: No results for input(s): "VITAMINB12", "FOLATE", "FERRITIN", "TIBC", "IRON", "RETICCTPCT" in the last 72 hours. Urine analysis:    Component Value Date/Time   COLORURINE YELLOW 09/01/2021 2129   APPEARANCEUR CLEAR 09/01/2021 2129   LABSPEC >1.046 (H) 09/01/2021 2129   PHURINE 5.0 09/01/2021 2129   GLUCOSEU NEGATIVE 09/01/2021 2129   HGBUR NEGATIVE 09/01/2021 2129   BILIRUBINUR NEGATIVE 09/01/2021 2129   BILIRUBINUR neg 06/08/2012 1700   KETONESUR 20 (A) 09/01/2021 2129   PROTEINUR >=300 (A) 09/01/2021 2129   UROBILINOGEN 0.2 06/08/2012 1700   NITRITE NEGATIVE 09/01/2021 2129   LEUKOCYTESUR NEGATIVE 09/01/2021 2129    Radiological Exams on Admission: MR BRAIN WO CONTRAST  Result Date: 09/02/2021 CLINICAL DATA:  Facial droop EXAM: MRI HEAD WITHOUT CONTRAST TECHNIQUE: Multiplanar, multiecho pulse sequences of the brain and surrounding structures were obtained without intravenous contrast. COMPARISON:  None Available. FINDINGS: Brain: There is a small focus of abnormal diffusion restriction at the dorsal lateral right medulla oblongata. Chronic microhemorrhage in the right caudate head. There is multifocal hyperintense T2-weighted signal within the white matter. Generalized volume loss. Old small vessel infarcts of the basal ganglia. The midline structures are normal. Vascular: Major flow voids are preserved. Skull and upper cervical spine: Normal calvarium and skull base. Visualized upper cervical spine and soft tissues are normal. Sinuses/Orbits:No paranasal sinus fluid levels or advanced mucosal thickening. No mastoid or middle ear effusion. Normal orbits. IMPRESSION: 1. Small focus of acute ischemia at the dorsal lateral right medulla oblongata. No hemorrhage or mass effect. 2. Findings of chronic ischemic microangiopathy and generalized volume loss.  Electronically Signed   By: Deatra Robinson M.D.   On: 09/02/2021 00:26   CT CEREBRAL PERFUSION W CONTRAST  Result Date: 09/01/2021 CLINICAL DATA:  Right gaze.  Slurred speech. EXAM: CT PERFUSION BRAIN TECHNIQUE: Multiphase CT imaging of the brain was performed following IV bolus contrast injection. Subsequent parametric perfusion maps were calculated using RAPID software. RADIATION DOSE REDUCTION: This exam was performed according to the departmental dose-optimization program which includes automated exposure control, adjustment of the mA and/or kV according to patient size and/or use of iterative reconstruction technique. CONTRAST:  29mL OMNIPAQUE IOHEXOL 350 MG/ML SOLN COMPARISON:  CT studies earlier same day FINDINGS: CT Brain Perfusion Findings: CBF (<30%) Volume: 19mL Perfusion (Tmax>6.0s) volume: 46mL Mismatch Volume: 63mL  ASPECTS on noncontrast CT Head: 10 at 1930 hours today. Infarct Core: 0 mL Infarction Location:None IMPRESSION: Negative perfusion study. This argues strongly against acute left anterior cerebral artery occlusion. Electronically Signed   By: Nelson Chimes M.D.   On: 09/01/2021 20:52   CT ANGIO HEAD NECK W WO CM (CODE STROKE)  Result Date: 09/01/2021 CLINICAL DATA:  Neuro deficit, acute, stroke suspected. Slurred speech. Right gaze deviation. EXAM: CT ANGIOGRAPHY HEAD AND NECK TECHNIQUE: Multidetector CT imaging of the head and neck was performed using the standard protocol during bolus administration of intravenous contrast. Multiplanar CT image reconstructions and MIPs were obtained to evaluate the vascular anatomy. Carotid stenosis measurements (when applicable) are obtained utilizing NASCET criteria, using the distal internal carotid diameter as the denominator. RADIATION DOSE REDUCTION: This exam was performed according to the departmental dose-optimization program which includes automated exposure control, adjustment of the mA and/or kV according to patient size and/or use of  iterative reconstruction technique. CONTRAST:  58mL OMNIPAQUE IOHEXOL 350 MG/ML SOLN COMPARISON:  Head CT earlier same day.  MRI 09/06/2020. FINDINGS: CTA NECK FINDINGS Aortic arch: Normal. No atherosclerotic change, aneurysm or dissection. Right carotid system: Common carotid artery widely patent to the bifurcation. Carotid bifurcation is normal without soft or calcified plaque. Cervical ICA is normal. Left carotid system: Similarly normal. Vertebral arteries: Both vertebral artery origins are patent. The right vertebral artery is a very small vessel. The left vertebral artery is dominant. Both vessels are patent through the cervical region to the foramen magnum. Skeleton: Ordinary cervical spondylosis. Other neck: No mass or lymphadenopathy. Upper chest: Lung apices are clear. Review of the MIP images confirms the above findings CTA HEAD FINDINGS Anterior circulation: Both internal carotid arteries are widely patent through the skull base and siphon regions. There is siphon atherosclerotic disease, worse on the right than the left, with stenosis possibly in the range of 50%. The anterior and middle cerebral vessels are patent. There is no large vessel occlusion. There is a moderate stenosis of the distal M1 segment on the left. There is atherosclerotic narrowing and irregularity of the MCA branch vessels more diffusely on both sides. Patient may have variant anterior cerebral artery anatomy with diminutive left anterior cerebral vessel. However, I have some concern that there could be occlusion of the left anterior cerebral artery. Posterior circulation: Both vertebral arteries are patent through the foramen magnum. Both vessels reach the basilar. No basilar stenosis. PCA vessels receive most of there supply from the anterior circulation. There is considerable distal vessel atherosclerotic irregularity. Venous sinuses: Patent and normal. Anatomic variants: None significant. Review of the MIP images confirms the  above findings IMPRESSION: No significant disease in the neck. There appears to be variant anterior cerebral anatomy, with a diminutive left anterior cerebral artery. However, I do have concern that the left anterior cerebral artery could be acutely occluded. Intracranial atherosclerotic disease diffusely affecting the anterior, middle and posterior cerebral artery vessels. This includes a moderate stenosis of the distal M1 segment on the left. See above discussion about the potential for left anterior cerebral artery occlusion. Atherosclerotic disease in both carotid siphon regions with stenosis approaching 50%, particularly on the right. Findings discussed with Dr. Lorrin Goodell at 2010 hours. Electronically Signed   By: Nelson Chimes M.D.   On: 09/01/2021 20:23   CT HEAD CODE STROKE WO CONTRAST`  Result Date: 09/01/2021 CLINICAL DATA:  Code stroke.  Right gaze and slurred speech EXAM: CT HEAD WITHOUT CONTRAST TECHNIQUE: Contiguous axial images were obtained  from the base of the skull through the vertex without intravenous contrast. RADIATION DOSE REDUCTION: This exam was performed according to the departmental dose-optimization program which includes automated exposure control, adjustment of the mA and/or kV according to patient size and/or use of iterative reconstruction technique. COMPARISON:  None Available. FINDINGS: Brain: There is no mass, hemorrhage or extra-axial collection. The size and configuration of the ventricles and extra-axial CSF spaces are normal. 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 Vascular: No abnormal hyperdensity of the major intracranial arteries or dural venous sinuses. No intracranial atherosclerosis. Skull: The visualized skull base, calvarium and extracranial soft tissues are normal. Sinuses/Orbits: No fluid levels or advanced mucosal thickening of the visualized paranasal sinuses. No mastoid or middle ear effusion. The orbits are normal. ASPECTS [facility] Stroke  Program Early CT Score) - Ganglionic level infarction (caudate, lentiform nuclei, internal capsule, insula, M1-M3 cortex): 7 - Supraganglionic infarction (M4-M6 cortex): 3 Total score (0-10 with 10 being normal): 10 IMPRESSION: 1. No acute intracranial abnormality. 2. ASPECTS is 10. These results were communicated to Dr. Donnetta Simpers at 7:37 pm on 09/01/2021 by text page via the [facility] messaging system. Electronically Signed   By: Ulyses Jarred M.D.   On: 09/01/2021 19:43     EKG: Independently reviewed, with result as described above.    Assessment/Plan   Principal Problem:   Acute ischemic stroke [facility]) Active Problems:   DM2 (diabetes mellitus, type 2) ([place])   Essential hypertension, benign   HLD (hyperlipidemia)       #) Acute ischemic CVA: Diagnosis on the basis of acute onset disequilibrium associated with dysarthria, with perpetuation of the symptoms, and the last known normal noted to be 2100 on Friday, 08/29/2021, with MRI brain showing evidence of acute ischemic infarct involving the dorsal lateral right medulla oblongata, as above.  Also underwent CTA head and neck along with CT cerebral perfusion scan, which ultimately demonstrated no evidence of large vessel occlusion, as further detailed above.   Neurology consulted, as above, with recommendation for admission to the hospital service for further evaluation management of acute ischemic CVA, as above, including further assessment of potential modifiable acute ischemic CVA risk factors, as below..  Not a candidate for tPA as the patient presented outside of the window for such.  Of note, the patient appears to assess multiple modifiable CVA risk factors including a history of poorly controlled type 2 diabetes mellitus, hypertension, hyperlipidemia, will denying any known history of OSA for paroxysmal atrial fibrillation, confirming that he is a lifelong non-smoker.  Of note, it appears that the patient presents outside the window  for observance of permissive hypertension, as he is now approximately 72 hours out from last known normal.   Dr  Lorrin Goodell recommends initiation of a 3-week course of dual antiplatelet therapy with daily baby aspirin as well as Plavix 75 mg p.o. daily, followed by continuation of aspirin alone.   Current outpatient anti-lipid regimen: None, with plan, per neurology, for initiation of statin should ensuing lipid panel revealed LDL greater than 70.     Plan: Nursing bedside swallow evaluation x 1 now, and will not initiate oral medications or diet until the patient has passed this. Head of the bed at 30 degrees. Neuro checks per protocol. VS per protocol.  Monitor on telemetry, including monitoring for atrial fibrillation as modifiable risk factor for acute ischemic CVA.   TTE without bubble study has been ordered for the morning, as the patient had previously undergone a bubble  study a few years ago. Check lipid panel and A1c. PT/OT/ST consults have been ordered to occur in the morning.  3-week course of dual antibiotic therapy followed by continuation of daily baby aspirin alone, as above.  Neurology formally consulted, as above.          #) Type 2 Diabetes Mellitus: documented history of such. Home insulin regimen: None. Home oral hypoglycemic agents: Metformin, glipizide.  Appears poorly controlled per most recent nuclear [place] of 11.2% in May 2014.  Presenting blood sugar: 198.   Plan: accuchecks QAC and HS with low dose SSI.  Check hemoglobin A1c. hold home oral hypoglycemic agents during this hospitalization.           #) Essential Hypertension: documented h/o such, with outpatient antihypertensive regimen including metoprolol succinate.  SBP's in the ED today: 150s to 170s mmHg. of note, it appears at site of entry for progressive hypertension in the setting of presenting acute ischemic CVA, as further detailed above.  Plan: Close monitoring of subsequent BP via routine VS.  anticipate resumption of outpatient hypertensive medication.            #) Hyperlipidemia: documented h/o such.  Appears to be managed via lifestyle modifications as an outpatient, in the absence of any current antilipid medications at home.   Plan: Check lipid panel as component of work-up for presenting acute ischemic CVA, with associated neurology recommendation to initiate statin if associated LDL greater than 70.     DVT prophylaxis: SCD's   Code Status: Full code Family Communication: Patient's case discussed with the patient's wife Disposition Plan: Per Rounding Team Consults called: On-call neurology has consulted (Dr. Lorrin Goodell), as further detailed above Admission status: Inpatient    PLEASE NOTE THAT DRAGON DICTATION SOFTWARE WAS USED IN THE CONSTRUCTION OF THIS NOTE.   Camanche [place] DO [facility]  From [facility] [facility]   09/02/2021, 6:04 AM

## 2021-09-02 NOTE — Assessment & Plan Note (Addendum)
MRI with small focus of acute ischemia at dorsal lateral right medulla oblongata  CTA head/neck with variant anteriorcerebral anatoma, diminutive L anterior cerebral artery (concern possibly acutely occluded).  Intrcranial atherosclerotic disease diffusely affecting the anterior, middle, and posterior cerebral artery vessels.  Moderate stenosis of distal M1 segment on L. Atherosclerotic disease in both carotid siphon regions with stenosis approaching 50% particularly on the R  Echocardiogram with preserved LV systolic function.   Patient with improvement in neurologic deficit.  Plan to continue outpatient PT and OT Dual antiplatelet therapy with aspirin and clopidogrel, then continue with aspirin alone.   Unfortunately not able to go to CIR or SNF, will have to be more mobile before can be discharge home.  Social services to assist in home health services.  Ok to discontinue telemetry.

## 2021-09-02 NOTE — ED Notes (Signed)
RN paged Dr about BP

## 2021-09-02 NOTE — Progress Notes (Signed)
PT Cancellation Note  Patient Details Name: MARTON MALIZIA MRN: [id number] DOB: Jun 16, 1965   Cancelled Treatment:    Reason Eval/Treat Not Completed: Medical issues which prohibited therapy.  Continues to have high BP but also has nausea from lunch, emesis bag provided.   Ivar Drape 09/02/2021, 2:30 PM  Samul Dada, PT PhD Acute Rehab Dept. Number: [facility] [id number] and [facility] [phone]

## 2021-09-02 NOTE — Consult Note (Incomplete)
NEUROLOGY CONSULTATION NOTE   Date of service: September 01, 2021 Patient Name: Shawn Austin MRN:  [id number] DOB:  Jan 28, 1966 Reason for consult: "Code stroke for R gaze preference and dysequilibrium" Requesting Provider: Gloris Manchester, MD _ _ _   _ __   _ __ _ _  __ __   _ __   __ _  History of Present Illness  SHAYAN BRAMHALL is a 56 y.o. male with PMH significant for DM2, HTN, HLD, degenerative spinal disc disease, who presents with      has a past medical history of Anemia, Articular cartilage disorder of hip, Cerebral ischemia, Congenital deformity of right hip joint, Degeneration of cervical intervertebral disc, Diabetes mellitus without complication (HCC), Fatigue, Hyperglycemia due to diabetes mellitus (HCC), Hyperlipidemia, Hypertension, Hypocalcemia, Hyponatremia, Lower limb pain, inferior, right, Mild cognitive disorder, Pain in joint of left knee, Recurrent falls, Spondylolisthesis of lumbar region, and Spondylolisthesis, lumbar region. who presents with  ***   ROS   Constitutional Denies weight loss, fever and chills. ***  HEENT Denies changes in vision and hearing. ***  Respiratory Denies SOB and cough. ***  CV Denies palpitations and CP ***  GI Denies abdominal pain, nausea, vomiting and diarrhea. ***  GU Denies dysuria and urinary frequency. ***  MSK Denies myalgia and joint pain. ***  Skin Denies rash and pruritus. ***  Neurological Denies headache and syncope. ***  Psychiatric Denies recent changes in mood. Denies anxiety and depression. ***   Past History   Past Medical History:  Diagnosis Date  . Anemia   . Articular cartilage disorder of hip   . Cerebral ischemia   . Congenital deformity of right hip joint   . Degeneration of cervical intervertebral disc   . Diabetes mellitus without complication (HCC)    type 2  . Fatigue   . Hyperglycemia due to diabetes mellitus (HCC)   . Hyperlipidemia   . Hypertension   . Hypocalcemia   . Hyponatremia   . Lower  limb pain, inferior, right   . Mild cognitive disorder   . Pain in joint of left knee   . Recurrent falls   . Spondylolisthesis of lumbar region   . Spondylolisthesis, lumbar region    History reviewed. No pertinent surgical history. No family history on file. Social History   Socioeconomic History  . Marital status: Single    Spouse name: Not on file  . Number of children: Not on file  . Years of education: Not on file  . Highest education level: Not on file  Occupational History  . Not on file  Tobacco Use  . Smoking status: Never  . Smokeless tobacco: Not on file  Substance and Sexual Activity  . Alcohol use: No  . Drug use: No  . Sexual activity: Not Currently  Other Topics Concern  . Not on file  Social History Narrative  . Not on file   Social Determinants of Health   Financial Resource Strain: Not on file  Food Insecurity: Not on file  Transportation Needs: Not on file  Physical Activity: Not on file  Stress: Not on file  Social Connections: Not on file   No Known Allergies  Medications  (Not in a hospital admission)    Vitals   Vitals:   09/01/21 1900 09/01/21 1915 09/01/21 2004 09/01/21 2015  BP: (!) 192/102 (!) 205/106 (!) 202/107 (!) 188/110  Pulse: 84 81 89 85  Resp: 12 12 13 12   Temp:  TempSrc:      SpO2: 100% 100% 100% 99%  Weight:      Height:         Body mass index is 28.06 kg/m.  Physical Exam   General: Laying comfortably in bed; in no acute distress. *** HENT: Normal oropharynx and mucosa. Normal external appearance of ears and nose. *** Neck: Supple, no pain or tenderness *** CV: No JVD. No peripheral edema. *** Pulmonary: Symmetric Chest rise. Normal respiratory effort. *** Abdomen: Soft to touch, non-tender. *** Ext: No cyanosis, edema, or deformity *** Skin: No rash. Normal palpation of skin.  *** Musculoskeletal: Normal digits and nails by inspection. No clubbing. ***  Neurologic Examination  Mental  status/Cognition: Alert, oriented to self, place, month and year, good attention. *** Speech/language: Fluent, comprehension intact, object naming intact, repetition intact. *** Cranial nerves:   CN II Pupils equal and reactive to light, no VF deficits ***   CN III,IV,VI EOM intact, no gaze preference or deviation, no nystagmus ***   CN V normal sensation in V1, V2, and V3 segments bilaterally ***   CN VII no asymmetry, no nasolabial fold flattening ***   CN VIII normal hearing to speech ***   CN IX & X normal palatal elevation, no uvular deviation ***   CN XI 5/5 head turn and 5/5 shoulder shrug bilaterally ***   CN XII midline tongue protrusion ***   Motor:  Muscle bulk: ***, tone ***, pronator drift *** tremor *** Mvmt Root Nerve  Muscle Right Left Comments  SA C5/6 Ax Deltoid     EF C5/6 Mc Biceps     EE C6/7/8 Rad Triceps     WF C6/7 Med FCR     WE C7/8 PIN ECU     F Ab C8/T1 U ADM/FDI     HF L1/2/3 Fem Illopsoas     KE L2/3/4 Fem Quad     DF L4/5 D Peron Tib Ant     PF S1/2 Tibial Grc/Sol      Reflexes:  Right Left Comments  Pectoralis      Biceps (C5/6)     Brachioradialis (C5/6)      Triceps (C6/7)      Patellar (L3/4)      Achilles (S1)      Hoffman      Plantar     Jaw jerk    Sensation:  Light touch    Pin prick    Temperature    Vibration   Proprioception    Coordination/Complex Motor:  - Finger to Nose *** - Heel to shin *** - Rapid alternating movement *** - Gait: Stride length ***. Arm swing ***. Base width ***  Labs   CBC:  Recent Labs  Lab 09/01/21 1802  HGB 13.6  HCT 40.0    Basic Metabolic Panel:  Lab Results  Component Value Date   NA 139 09/01/2021   K 4.3 09/01/2021   CO2 24 09/01/2021   GLUCOSE 199 (H) 09/01/2021   BUN 10 09/01/2021   CREATININE 0.90 09/01/2021   CALCIUM 9.1 09/01/2021   GFRNONAA >60 09/01/2021   Lipid Panel:  Lab Results  Component Value Date   LDLCALC 119 (H) 04/14/2012   HgbA1c:  Lab Results   Component Value Date   HGBA1C 11.2 06/27/2012   Urine Drug Screen: No results found for: "LABOPIA", "COCAINSCRNUR", "LABBENZ", "AMPHETMU", "THCU", "LABBARB"  Alcohol Level     Component Value Date/Time   ETH <10 09/01/2021 1755  CT Head without contrast(Personally reviewed): ***  CT angio Head and Neck with contrast(Personally reviewed): ***  MR Angio head without contrast and Carotid Duplex BL(Personally reviewed): ***  MRI Brain(Personally reviewed): ***  rEEG:  ***  Impression   MORIO WIDEN is a 56 y.o. male with PMH significant for ***. His neurologic examination is notable for ***.  Primary Diagnosis:  {Cerebral Infarction:22351}  Secondary Diagnosis: {Stroke Comorbidities:21266}  Recommendations  *** ______________________________________________________________________   Thank you for the opportunity to take part in the care of this patient. If you have any further questions, please contact the neurology consultation attending.  Signed,  Erick Blinks [facility] Pager Number [phone] _ _ _   _ __   _ __ _ _  __ __   _ __   __ _

## 2021-09-02 NOTE — Assessment & Plan Note (Signed)
lipitor

## 2021-09-02 NOTE — Progress Notes (Signed)
PT Cancellation Note  Patient Details Name: Shawn Austin MRN: [id number] DOB: 07/02/1965   Cancelled Treatment:    Reason Eval/Treat Not Completed: Medical issues which prohibited therapy.  Very high BP even for permissive HTN, retry as pt can tolerate.   Ivar Drape 09/02/2021, 1:33 PM  Samul Dada, PT PhD Acute Rehab Dept. Number: [facility] [id number] and [facility] [phone]

## 2021-09-02 NOTE — Progress Notes (Addendum)
PROGRESS NOTE    JOSEF TOURIGNY  MLY:[id number] DOB: September 26, 1965 DOA: 09/01/2021 PCP: Judee Clara, FNP  Chief Complaint  Patient presents with   Code Stroke    Brief Narrative:  JADRIAN BULMAN is Harshitha Fretz 56 y.o. male with medical history significant for type 2 diabetes mellitus, hypertension, hyperlipidemia who is admitted to [facility] on 09/01/2021 with acute ischemic stroke after presenting from home to [facility] ED complaining of disequilibrium.   MRI showed acute ischemia at the dorsal lateral right medulla oblongata.  Neuro c/s, recommending DAPT.  Therapy evaluations pending.  See below for additional details    Assessment & Plan:   Principal Problem:   Acute ischemic stroke [facility]) Active Problems:   Essential hypertension, benign   DM2 (diabetes mellitus, type 2) (HCC)   HLD (hyperlipidemia)   Prolonged QT interval   Assessment and Plan: * Acute ischemic stroke (HCC) MRI with small focus of acute ischemia at dorsal lateral right medulla oblongata CTA head/neck with variant anteriorcerebral anatoma, diminutive L anterior cerebral artery (concern possibly acutely occluded).  Intrcranial atherosclerotic disease diffusely affecting the anterior, middle, and posterior cerebral artery vessels.  Moderate stenosis of distal M1 segment on L. Atherosclerotic disease in both carotid siphon regions with stenosis approaching 50% particularly on the R Negative CT cerebral perfusion study (argues against L anterior cerebral artery occlusion) Pending echo LDL 169, a1c 8.1 Neuro recommending DAPTx3 weeks, then aspirin alone lipitor Pending therapy   Essential hypertension, benign Permissive hypertension Though I think given symptoms 1-2 days ago can start to gradually normalize Per neurology Prn labetalol  DM2 (diabetes mellitus, type 2) (HCC) SSI On metformin and glipizide at home -> though he's not taking per med recs  HLD (hyperlipidemia) lipitor  Prolonged QT  interval Follow mag, lytes Trend EKG Avoid qt prolonging meds     DVT prophylaxis: lovenox Code Status: full Family Communication: wife, son at bedside Disposition:   Status is: Inpatient Remains inpatient appropriate because: need for continued workup, therapy   Consultants:  neurology  Procedures:  Pending echo  Antimicrobials:  Anti-infectives (From admission, onward)    None       Subjective: Has nausea after lunch   Objective: Vitals:   09/02/21 1910 09/02/21 1911 09/02/21 1916 09/02/21 1930  BP:    (!) 163/102  Pulse: 88 88  87  Resp: 13 12  11   Temp:   98.3 F (36.8 C)   TempSrc:   Oral   SpO2: 100% 100%  100%  Weight:      Height:       No intake or output data in the 24 hours ending 09/02/21 2015 Filed Weights   09/01/21 1655  Weight: 86.2 kg    Examination:  General exam: Appears calm and comfortable  Respiratory system unlabored Cardiovascular system: RRR Central nervous system: right gaze preference, CN 2-12 intact, ataxia with FNF bilaterally, symmetric strenght Extremities: no LEE    Data Reviewed: I have personally reviewed following labs and imaging studies  CBC: Recent Labs  Lab 09/01/21 1755 09/01/21 1802 09/02/21 0427  WBC 5.1  --  5.5  NEUTROABS 3.5  --  3.2  HGB 13.0 13.6 13.5  HCT 39.2 40.0 39.7  MCV 85.2  --  83.6  PLT 408*  --  399    Basic Metabolic Panel: Recent Labs  Lab 09/01/21 1755 09/01/21 1802 09/02/21 0427  NA 137 139 138  K 4.2 4.3 3.7  CL 102 105 108  CO2  24  --  26  GLUCOSE 198* 199* 150*  BUN 9 10 8   CREATININE 0.97 0.90 0.86  CALCIUM 9.1  --  8.9  MG  --   --  2.0    GFR: Estimated Creatinine Clearance: 104.3 mL/min (by C-G formula based on SCr of 0.86 mg/dL).  Liver Function Tests: Recent Labs  Lab 09/01/21 1755 09/02/21 0427  AST 16 17  ALT 12 13  ALKPHOS 93 87  BILITOT 1.2 1.2  PROT 6.9 6.7  ALBUMIN 3.6 3.4*    CBG: Recent Labs  Lab 09/01/21 1658 09/02/21 1158  09/02/21 1639 09/02/21 1758  GLUCAP 190* 179* 172* 157*     Recent Results (from the past 240 hour(s))  Resp Panel by RT-PCR (Flu Hannah Crill&B, Covid) Anterior Nasal Swab     Status: None   Collection Time: 09/01/21  9:24 PM   Specimen: Anterior Nasal Swab  Result Value Ref Range Status   SARS Coronavirus 2 by RT PCR NEGATIVE NEGATIVE Final    Comment: (NOTE) SARS-CoV-2 target nucleic acids are NOT DETECTED.  The SARS-CoV-2 RNA is generally detectable in upper respiratory specimens during the acute phase of infection. The lowest concentration of SARS-CoV-2 viral copies this assay can detect is 138 copies/mL. Erial Fikes negative result does not preclude SARS-Cov-2 infection and should not be used as the sole basis for treatment or other patient management decisions. Christa Fasig negative result may occur with  improper specimen collection/handling, submission of specimen other than nasopharyngeal swab, presence of viral mutation(s) within the areas targeted by this assay, and inadequate number of viral copies(<138 copies/mL). Marifer Hurd negative result must be combined with clinical observations, patient history, and epidemiological information. The expected result is Negative.  Fact Sheet for Patients:  09/03/21  Fact Sheet for Healthcare Providers:  BloggerCourse.com  This test is no t yet approved or cleared by the [employer]ious[employer] FDA and  has been authorized for detection and/or diagnosis of SARS-CoV-2 by FDA under an Emergency Use Authorization (EUA). This EUA will remain  in effect (meaning this test can be used) for the duration of the COVID-19 declaration under Section 564(b)(1) of the Act, 21 U.S.C.section 360bbb-3(b)(1), unless the authorization is terminated  or revoked sooner.       Influenza Shiori Adcox by PCR NEGATIVE NEGATIVE Final   Influenza B by PCR NEGATIVE NEGATIVE Final    Comment: (NOTE) The Xpert Xpress SARS-CoV-2/FLU/RSV plus assay is  intended as an aid in the diagnosis of influenza from Nasopharyngeal swab specimens and should not be used as Dharma Pare sole basis for treatment. Nasal washings and aspirates are unacceptable for Xpert Xpress SARS-CoV-2/FLU/RSV testing.  Fact Sheet for Patients: [place]  Fact Sheet for Healthcare Providers: BloggerCourse.com  This test is not yet approved or cleared by the [employer]ious[employer] FDA and has been authorized for detection and/or diagnosis of SARS-CoV-2 by FDA under an Emergency Use Authorization (EUA). This EUA will remain in effect (meaning this test can be used) for the duration of the COVID-19 declaration under Section 564(b)(1) of the Act, 21 U.S.C. section 360bbb-3(b)(1), unless the authorization is terminated or revoked.  Performed at [facility], [address], [place], [place]          Radiology Studies: MR BRAIN WO CONTRAST  Result Date: 09/02/2021 CLINICAL DATA:  Facial droop EXAM: MRI HEAD WITHOUT CONTRAST TECHNIQUE: Multiplanar, multiecho pulse sequences of the brain and surrounding structures were obtained without intravenous contrast. COMPARISON:  None Available. FINDINGS: Brain: There is Shane Melby small  focus of abnormal diffusion restriction at the dorsal lateral right medulla oblongata. Chronic microhemorrhage in the right caudate head. There is multifocal hyperintense T2-weighted signal within the white matter. Generalized volume loss. Old small vessel infarcts of the basal ganglia. The midline structures are normal. Vascular: Major flow voids are preserved. Skull and upper cervical spine: Normal calvarium and skull base. Visualized upper cervical spine and soft tissues are normal. Sinuses/Orbits:No paranasal sinus fluid levels or advanced mucosal thickening. No mastoid or middle ear effusion. Normal orbits. IMPRESSION: 1. Small focus of acute ischemia at the dorsal lateral right medulla oblongata. No  hemorrhage or mass effect. 2. Findings of chronic ischemic microangiopathy and generalized volume loss. Electronically Signed   By: Deatra Robinson M.D.   On: 09/02/2021 00:26   CT CEREBRAL PERFUSION W CONTRAST  Result Date: 09/01/2021 CLINICAL DATA:  Right gaze.  Slurred speech. EXAM: CT PERFUSION BRAIN TECHNIQUE: Multiphase CT imaging of the brain was performed following IV bolus contrast injection. Subsequent parametric perfusion maps were calculated using RAPID software. RADIATION DOSE REDUCTION: This exam was performed according to the departmental dose-optimization program which includes automated exposure control, adjustment of the mA and/or kV according to patient size and/or use of iterative reconstruction technique. CONTRAST:  60mL OMNIPAQUE IOHEXOL 350 MG/ML SOLN COMPARISON:  CT studies earlier same day FINDINGS: CT Brain Perfusion Findings: CBF (<30%) Volume: 54mL Perfusion (Tmax>6.0s) volume: 29mL Mismatch Volume: 42mL ASPECTS on noncontrast CT Head: 10 at 1930 hours today. Infarct Core: 0 mL Infarction Location:None IMPRESSION: Negative perfusion study. This argues strongly against acute left anterior cerebral artery occlusion. Electronically Signed   By: Paulina Fusi M.D.   On: 09/01/2021 20:52   CT ANGIO HEAD NECK W WO CM (CODE STROKE)  Result Date: 09/01/2021 CLINICAL DATA:  Neuro deficit, acute, stroke suspected. Slurred speech. Right gaze deviation. EXAM: CT ANGIOGRAPHY HEAD AND NECK TECHNIQUE: Multidetector CT imaging of the head and neck was performed using the standard protocol during bolus administration of intravenous contrast. Multiplanar CT image reconstructions and MIPs were obtained to evaluate the vascular anatomy. Carotid stenosis measurements (when applicable) are obtained utilizing NASCET criteria, using the distal internal carotid diameter as the denominator. RADIATION DOSE REDUCTION: This exam was performed according to the departmental dose-optimization program which includes  automated exposure control, adjustment of the mA and/or kV according to patient size and/or use of iterative reconstruction technique. CONTRAST:  67mL OMNIPAQUE IOHEXOL 350 MG/ML SOLN COMPARISON:  Head CT earlier same day.  MRI 09/06/2020. FINDINGS: CTA NECK FINDINGS Aortic arch: Normal. No atherosclerotic change, aneurysm or dissection. Right carotid system: Common carotid artery widely patent to the bifurcation. Carotid bifurcation is normal without soft or calcified plaque. Cervical ICA is normal. Left carotid system: Similarly normal. Vertebral arteries: Both vertebral artery origins are patent. The right vertebral artery is Asha Grumbine very small vessel. The left vertebral artery is dominant. Both vessels are patent through the cervical region to the foramen magnum. Skeleton: Ordinary cervical spondylosis. Other neck: No mass or lymphadenopathy. Upper chest: Lung apices are clear. Review of the MIP images confirms the above findings CTA HEAD FINDINGS Anterior circulation: Both internal carotid arteries are widely patent through the skull base and siphon regions. There is siphon atherosclerotic disease, worse on the right than the left, with stenosis possibly in the range of 50%. The anterior and middle cerebral vessels are patent. There is no large vessel occlusion. There is Suliman Termini moderate stenosis of the distal M1 segment on the left. There is atherosclerotic narrowing and irregularity  of the MCA branch vessels more diffusely on both sides. Patient may have variant anterior cerebral artery anatomy with diminutive left anterior cerebral vessel. However, I have some concern that there could be occlusion of the left anterior cerebral artery. Posterior circulation: Both vertebral arteries are patent through the foramen magnum. Both vessels reach the basilar. No basilar stenosis. PCA vessels receive most of there supply from the anterior circulation. There is considerable distal vessel atherosclerotic irregularity. Venous  sinuses: Patent and normal. Anatomic variants: None significant. Review of the MIP images confirms the above findings IMPRESSION: No significant disease in the neck. There appears to be variant anterior cerebral anatomy, with Renae Mottley diminutive left anterior cerebral artery. However, I do have concern that the left anterior cerebral artery could be acutely occluded. Intracranial atherosclerotic disease diffusely affecting the anterior, middle and posterior cerebral artery vessels. This includes Britton Perkinson moderate stenosis of the distal M1 segment on the left. See above discussion about the potential for left anterior cerebral artery occlusion. Atherosclerotic disease in both carotid siphon regions with stenosis approaching 50%, particularly on the right. Findings discussed with Dr. Derry Lory at 2010 hours. Electronically Signed   By: Paulina Fusi M.D.   On: 09/01/2021 20:23   CT HEAD CODE STROKE WO CONTRAST`  Result Date: 09/01/2021 CLINICAL DATA:  Code stroke.  Right gaze and slurred speech EXAM: CT HEAD WITHOUT CONTRAST TECHNIQUE: Contiguous axial images were obtained from the base of the skull through the vertex without intravenous contrast. RADIATION DOSE REDUCTION: This exam was performed according to the departmental dose-optimization program which includes automated exposure control, adjustment of the mA and/or kV according to patient size and/or use of iterative reconstruction technique. COMPARISON:  None Available. FINDINGS: Brain: There is no mass, hemorrhage or extra-axial collection. The size and configuration of the ventricles and extra-axial CSF spaces are normal. 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 Vascular: No abnormal hyperdensity of the major intracranial arteries or dural venous sinuses. No intracranial atherosclerosis. Skull: The visualized skull base, calvarium and extracranial soft tissues are normal. Sinuses/Orbits: No fluid levels or advanced mucosal thickening of the  visualized paranasal sinuses. No mastoid or middle ear effusion. The orbits are normal. ASPECTS [facility] Stroke Program Early CT Score) - Ganglionic level infarction (caudate, lentiform nuclei, internal capsule, insula, M1-M3 cortex): 7 - Supraganglionic infarction (M4-M6 cortex): 3 Total score (0-10 with 10 being normal): 10 IMPRESSION: 1. No acute intracranial abnormality. 2. ASPECTS is 10. These results were communicated to Dr. Erick Blinks at 7:37 pm on 09/01/2021 by text page via the [facility] messaging system. Electronically Signed   By: Deatra Robinson M.D.   On: 09/01/2021 19:43        Scheduled Meds:   stroke: early stages of recovery book   Does not apply Once   [START ON 09/03/2021] aspirin  81 mg Oral Daily   atorvastatin  80 mg Oral Daily   clopidogrel  75 mg Oral Daily   insulin aspart  0-6 Units Subcutaneous Q6H   Continuous Infusions:   LOS: 0 days    Time spent: over 30 min    Lacretia Nicks, MD [facility]   To contact the attending provider between 7A-7P or the covering provider during after hours 7P-7A, please log into the web site [email or web address] and access using universal [place] password for that web site. If you do not have the password, please call the hospital operator.  09/02/2021, 8:15 PM

## 2021-09-02 NOTE — ED Provider Notes (Signed)
Physical Exam  BP (!) 186/105   Pulse 83   Temp 97.8 F (36.6 C) (Oral)   Resp 11   Ht 5\' 9"  (1.753 m)   Wt 86.2 kg   SpO2 100%   BMI 28.06 kg/m   Physical Exam Vitals and nursing note reviewed.  Constitutional:      Appearance: He is not ill-appearing or toxic-appearing.  HENT:     Head: Normocephalic and atraumatic.     Mouth/Throat:     Mouth: Mucous membranes are moist.     Pharynx: No oropharyngeal exudate or posterior oropharyngeal erythema.  Eyes:     General:        Right eye: No discharge.        Left eye: No discharge.     Extraocular Movements: Extraocular movements intact.     Conjunctiva/sclera: Conjunctivae normal.     Pupils: Pupils are equal, round, and reactive to light.  Cardiovascular:     Rate and Rhythm: Normal rate and regular rhythm.     Pulses: Normal pulses.     Heart sounds: Normal heart sounds. No murmur heard. Pulmonary:     Effort: Pulmonary effort is normal. No respiratory distress.     Breath sounds: Normal breath sounds. No wheezing or rales.  Abdominal:     General: Bowel sounds are normal. There is no distension.     Palpations: Abdomen is soft.     Tenderness: There is no abdominal tenderness. There is no guarding or rebound.  Musculoskeletal:        General: No deformity.     Cervical back: Neck supple.     Right lower leg: No edema.     Left lower leg: No edema.  Skin:    General: Skin is warm and dry.     Capillary Refill: Capillary refill takes less than 2 seconds.  Neurological:     General: No focal deficit present.     Mental Status: He is alert and oriented to person, place, and time. Mental status is at baseline.     GCS: GCS eye subscore is 4. GCS verbal subscore is 4. GCS motor subscore is 6.     Cranial Nerves: Facial asymmetry present.     Sensory: Sensation is intact.     Motor: No weakness or pronator drift.     Coordination: Coordination abnormal. Finger-Nose-Finger Test abnormal. Heel to Shin Test normal.      Comments: Gait deferred.  Patient with strong preferential right-sided gaze.  Left head fall to the right as well.  With extensive coaching will turn head to the left to look at this provider throughout remainder of the exam looks towards his right side both with his head turned in his gaze get deviated towards the right. Slurred speech, right sided facial droop.   Psychiatric:        Mood and Affect: Mood normal.     Procedures  Procedures  ED Course / MDM   Clinical Course as of 09/02/21 0204  [facility] Sep 01, 2021  2312 CT ANGIO HEAD NECK W WO CM (CODE STROKE) [EC]    Clinical Course User Index [EC] 2313, PA-C   Medical Decision Making Amount and/or Complexity of Data Reviewed Labs: ordered.    Details: No CBC performed by preceding ED team, added months time.  CMP without metabolic derangement, coags are normal, UA with ketonuria and proteinuria but without evidence infection.  UDS is negative. Radiology: ordered. Decision-making details documented in  ED Course.    Details: CT head negative, CTA with possible left anterior cerebral artery occlusion, CT perfusion negative therefore likely no LVO.  MR with small acute ischemia in the dorsal lateral right medulla oblongata and findings consistent with chronic ischemic microangiopathy. ECG/medicine tests:     Details: EKG with normal sinus rhythm without STEMI.  Risk Prescription drug management. Decision regarding hospitalization.   Care of this patient assumed from preceding a provider Raynald Blend , PA-C at time of shift change.  Please see her associated note for further insight of the patient's ED course.  In brief patient is a 56 year old male who presented as a level 1 code stroke with right-sided gaze deviation, slurred speech, and difficulty walking noted around 2 to 3 PM, shortly before arrival.  Patient last known well 9 PM on Saturday.  Per preceding ED provider there is impairment of finger-to-nose function  and bilateral eye deviation towards the right reportedly without neglect of the left.  Patient underwent evaluation with CT head and CTA, CT perfusion which were reassuring.    At time of shift change patient is pending an MR for further evaluation of possible ischemia.  Patient was evaluated at the bedside by neurology prior to MRI, NIH of 4 at that time.  MR concerning for acute infarct, repeat consult to neurology pending.  Per neurologist Dr. Keturah Barre, patient will require admission the hospital for completion of stroke work-up in context of acute infarct.  Appreciate collaboration of care of the patient.  Jasiah voiced understanding of his medical evaluation and treatment plan. Each of their questions answered to their expressed satisfaction.  He is amenable to plan for admission at this time.  Admission consult to hospitalist Dr. Arlean Hopping (0200 am) who is agreeable to admitting this patient to his service.  Appreciate collaboration of care of this patient.  This chart was dictated using voice recognition software, Dragon. Despite the best efforts of this provider to proofread and correct errors, errors may still occur which can change documentation meaning.       Paris Lore, PA-C 09/02/21 9774    Shon Baton, MD 09/02/21 [phone]

## 2021-09-02 NOTE — Progress Notes (Addendum)
STROKE TEAM PROGRESS NOTE   INTERVAL HISTORY No visitors at bedside Eating well. We discussed history, current symptoms, ongoing work up, diagnosis and plan of care. He shares that he is not a person who likes to take medications and has not been taking his BP meds consistently at home. All questions were answered.   He presented with slurred speech and ataxia.  MRI shows right lateral medullary infarct.  CT angiogram shows no significant posterior circulation disease but 50% right carotid siphon stenosis. Vitals:   09/02/21 1130 09/02/21 1200 09/02/21 1215 09/02/21 1230  BP: (!) 174/98 (!) 191/114 (!) 192/134 (!) 187/111  Pulse: 78 73 85 76  Resp: 19 13 15 11   Temp:      TempSrc:      SpO2: 100% 100% 100% 100%  Weight:      Height:       CBC:  Recent Labs  Lab 09/01/21 1755 09/01/21 1802 09/02/21 0427  WBC 5.1  --  5.5  NEUTROABS 3.5  --  3.2  HGB 13.0 13.6 13.5  HCT 39.2 40.0 39.7  MCV 85.2  --  83.6  PLT 408*  --  399   Basic Metabolic Panel:  Recent Labs  Lab 09/01/21 1755 09/01/21 1802 09/02/21 0427  NA 137 139 138  K 4.2 4.3 3.7  CL 102 105 108  CO2 24  --  26  GLUCOSE 198* 199* 150*  BUN 9 10 8   CREATININE 0.97 0.90 0.86  CALCIUM 9.1  --  8.9  MG  --   --  2.0   Lipid Panel:  Recent Labs  Lab 09/02/21 0427  CHOL 232*  TRIG 65  HDL 50  CHOLHDL 4.6  VLDL 13  LDLCALC *   HgbA1c:  Recent Labs  Lab 09/02/21 0427  HGBA1C 8.1*   Urine Drug Screen:  Recent Labs  Lab 09/01/21 2129  LABOPIA NONE DETECTED  COCAINSCRNUR NONE DETECTED  LABBENZ NONE DETECTED  AMPHETMU NONE DETECTED  THCU NONE DETECTED  LABBARB NONE DETECTED    Alcohol Level  Recent Labs  Lab 09/01/21 1755  ETH <10    IMAGING past 24 hours MR BRAIN WO CONTRAST  Result Date: 09/02/2021 CLINICAL DATA:  Facial droop EXAM: MRI HEAD WITHOUT CONTRAST TECHNIQUE: Multiplanar, multiecho pulse sequences of the brain and surrounding structures were obtained without intravenous  contrast. COMPARISON:  None Available. FINDINGS: Brain: There is a small focus of abnormal diffusion restriction at the dorsal lateral right medulla oblongata. Chronic microhemorrhage in the right caudate head. There is multifocal hyperintense T2-weighted signal within the white matter. Generalized volume loss. Old small vessel infarcts of the basal ganglia. The midline structures are normal. Vascular: Major flow voids are preserved. Skull and upper cervical spine: Normal calvarium and skull base. Visualized upper cervical spine and soft tissues are normal. Sinuses/Orbits:No paranasal sinus fluid levels or advanced mucosal thickening. No mastoid or middle ear effusion. Normal orbits. IMPRESSION: 1. Small focus of acute ischemia at the dorsal lateral right medulla oblongata. No hemorrhage or mass effect. 2. Findings of chronic ischemic microangiopathy and generalized volume loss. Electronically Signed   By: 09/03/21 M.D.   On: 09/02/2021 00:26   CT CEREBRAL PERFUSION W CONTRAST  Result Date: 09/01/2021 CLINICAL DATA:  Right gaze.  Slurred speech. EXAM: CT PERFUSION BRAIN TECHNIQUE: Multiphase CT imaging of the brain was performed following IV bolus contrast injection. Subsequent parametric perfusion maps were calculated using RAPID software. RADIATION DOSE REDUCTION: This exam was performed according to  the departmental dose-optimization program which includes automated exposure control, adjustment of the mA and/or kV according to patient size and/or use of iterative reconstruction technique. CONTRAST:  29mL OMNIPAQUE IOHEXOL 350 MG/ML SOLN COMPARISON:  CT studies earlier same day FINDINGS: CT Brain Perfusion Findings: CBF (<30%) Volume: 55mL Perfusion (Tmax>6.0s) volume: 50mL Mismatch Volume: 73mL ASPECTS on noncontrast CT Head: 10 at 1930 hours today. Infarct Core: 0 mL Infarction Location:None IMPRESSION: Negative perfusion study. This argues strongly against acute left anterior cerebral artery occlusion.  Electronically Signed   By: Paulina Fusi M.D.   On: 09/01/2021 20:52   CT ANGIO HEAD NECK W WO CM (CODE STROKE)  Result Date: 09/01/2021 CLINICAL DATA:  Neuro deficit, acute, stroke suspected. Slurred speech. Right gaze deviation. EXAM: CT ANGIOGRAPHY HEAD AND NECK TECHNIQUE: Multidetector CT imaging of the head and neck was performed using the standard protocol during bolus administration of intravenous contrast. Multiplanar CT image reconstructions and MIPs were obtained to evaluate the vascular anatomy. Carotid stenosis measurements (when applicable) are obtained utilizing NASCET criteria, using the distal internal carotid diameter as the denominator. RADIATION DOSE REDUCTION: This exam was performed according to the departmental dose-optimization program which includes automated exposure control, adjustment of the mA and/or kV according to patient size and/or use of iterative reconstruction technique. CONTRAST:  34mL OMNIPAQUE IOHEXOL 350 MG/ML SOLN COMPARISON:  Head CT earlier same day.  MRI 09/06/2020. FINDINGS: CTA NECK FINDINGS Aortic arch: Normal. No atherosclerotic change, aneurysm or dissection. Right carotid system: Common carotid artery widely patent to the bifurcation. Carotid bifurcation is normal without soft or calcified plaque. Cervical ICA is normal. Left carotid system: Similarly normal. Vertebral arteries: Both vertebral artery origins are patent. The right vertebral artery is a very small vessel. The left vertebral artery is dominant. Both vessels are patent through the cervical region to the foramen magnum. Skeleton: Ordinary cervical spondylosis. Other neck: No mass or lymphadenopathy. Upper chest: Lung apices are clear. Review of the MIP images confirms the above findings CTA HEAD FINDINGS Anterior circulation: Both internal carotid arteries are widely patent through the skull base and siphon regions. There is siphon atherosclerotic disease, worse on the right than the left, with  stenosis possibly in the range of 50%. The anterior and middle cerebral vessels are patent. There is no large vessel occlusion. There is a moderate stenosis of the distal M1 segment on the left. There is atherosclerotic narrowing and irregularity of the MCA branch vessels more diffusely on both sides. Patient may have variant anterior cerebral artery anatomy with diminutive left anterior cerebral vessel. However, I have some concern that there could be occlusion of the left anterior cerebral artery. Posterior circulation: Both vertebral arteries are patent through the foramen magnum. Both vessels reach the basilar. No basilar stenosis. PCA vessels receive most of there supply from the anterior circulation. There is considerable distal vessel atherosclerotic irregularity. Venous sinuses: Patent and normal. Anatomic variants: None significant. Review of the MIP images confirms the above findings IMPRESSION: No significant disease in the neck. There appears to be variant anterior cerebral anatomy, with a diminutive left anterior cerebral artery. However, I do have concern that the left anterior cerebral artery could be acutely occluded. Intracranial atherosclerotic disease diffusely affecting the anterior, middle and posterior cerebral artery vessels. This includes a moderate stenosis of the distal M1 segment on the left. See above discussion about the potential for left anterior cerebral artery occlusion. Atherosclerotic disease in both carotid siphon regions with stenosis approaching 50%, particularly on the right.  Findings discussed with Dr. Derry Lory at 2010 hours. Electronically Signed   By: Paulina Fusi M.D.   On: 09/01/2021 20:23   CT HEAD CODE STROKE WO CONTRAST`  Result Date: 09/01/2021 CLINICAL DATA:  Code stroke.  Right gaze and slurred speech EXAM: CT HEAD WITHOUT CONTRAST TECHNIQUE: Contiguous axial images were obtained from the base of the skull through the vertex without intravenous contrast.  RADIATION DOSE REDUCTION: This exam was performed according to the departmental dose-optimization program which includes automated exposure control, adjustment of the mA and/or kV according to patient size and/or use of iterative reconstruction technique. COMPARISON:  None Available. FINDINGS: Brain: There is no mass, hemorrhage or extra-axial collection. The size and configuration of the ventricles and extra-axial CSF spaces are normal. 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 Vascular: No abnormal hyperdensity of the major intracranial arteries or dural venous sinuses. No intracranial atherosclerosis. Skull: The visualized skull base, calvarium and extracranial soft tissues are normal. Sinuses/Orbits: No fluid levels or advanced mucosal thickening of the visualized paranasal sinuses. No mastoid or middle ear effusion. The orbits are normal. ASPECTS [facility] Stroke Program Early CT Score) - Ganglionic level infarction (caudate, lentiform nuclei, internal capsule, insula, M1-M3 cortex): 7 - Supraganglionic infarction (M4-M6 cortex): 3 Total score (0-10 with 10 being normal): 10 IMPRESSION: 1. No acute intracranial abnormality. 2. ASPECTS is 10. These results were communicated to Dr. Erick Blinks at 7:37 pm on 09/01/2021 by text page via the [facility] messaging system. Electronically Signed   By: Deatra Robinson M.D.   On: 09/01/2021 19:43    PHYSICAL EXAM General: Laying comfortably in bed; in no acute distress.  HENT: Normal oropharynx and mucosa. Normal external appearance of ears and nose.  Neck: Supple, no pain or tenderness  CV: No JVD. No peripheral edema.  Pulmonary: Symmetric Chest rise. Normal respiratory effort.  Abdomen: Soft to touch, non-tender.  Ext: No cyanosis, edema, or deformity  Skin: No rash. Normal palpation of skin.   Musculoskeletal: Normal digits and nails by inspection. No clubbing.    Neurologic Examination  Mental status/Cognition: Alert, oriented to self,  place, month and year, good attention.  Speech/language: Dysarthric speech, fluent, comprehension intact, object naming intact, repetition intact.  Cranial nerves:   CN II Pupils equal and reactive to light, no VF deficits    CN III,IV,VI EOM intact, mild R gaze preference but does cross midline. No gaze deviation, + right nystagmus.  Mild esotropia of the left eye   CN V normal sensation in V1, V2, and V3 segments bilaterally    CN VII no asymmetry, no nasolabial fold flattening    CN VIII normal hearing to speech    CN IX & X normal palatal elevation, no uvular deviation    CN XI 5/5 head turn and 5/5 shoulder shrug bilaterally    CN XII midline tongue protrusion     Motor:  Muscle bulk: normal , tone normal, pronator drift none tremor none Mvmt Root Nerve  Muscle Right Left Comments  SA C5/6 Ax Deltoid 5 5    EF C5/6 Mc Biceps 5 5    EE C6/7/8 Rad Triceps 5 5    WF C6/7 Med FCR        WE C7/8 PIN ECU        F Ab C8/T1 U ADM/FDI 5 5    HF L1/2/3 Fem Illopsoas 5 5    KE L2/3/4 Fem Quad 5 5    DF L4/5 D Peron Tib  Ant 5 5    PF S1/2 Tibial Grc/Sol 5 5      Reflexes:   Right Left Comments  Pectoralis         Biceps (C5/6) 2 2    Brachioradialis (C5/6) 2 2     Triceps (C6/7) 2 2     Patellar (L3/4) 2 2     Achilles (S1)         Hoffman         Plantar        Jaw jerk       Sensation:  Light touch Intact throughout   Pin prick     Temperature     Vibration    Proprioception      Coordination/Complex Motor:  - Finger to Nose with ataxia BL - Heel to shin with ataxia BL - Rapid alternating movement are normal - Gait: Deferred.    ASSESSMENT/PLAN Shawn Austin is a 56 y.o. male with PMH significant for DM2, HTN, HLD, degenerative spinal disc disease, who presents with leaning to the left and R gaze preference and feeling off balance with falls over the last few days. Neuro exam with ataxia, left worse than right along with slurred speech. Highest suspicion for a small  vessel stroke in the posterior circulation.  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 L ACA A1 segment occlusion  CT Perfusion: No mismatch in the L ACA distribution and thus unlikely for this to be an acute L ACA occlusion. 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 MRI: Small focus of acute ischemia at the dorsal lateral right medulla oblongata. No hemorrhage or mass effect.Findings of chronic ischemic microangiopathy and generalized volume loss. 2D Echo PENDING LDL 169 HgbA1c 8.1 VTE prophylaxis - recommended    Diet   DIET DYS 3 Room service appropriate? Yes; Fluid consistency: Thin  No AC/AP prior to admission  ASA 81mg  and Plavix 75 mg x 3 weeks then ASA 81mg  alone Therapy recommendations:  IPR Disposition:  TBD  Hypertension Stable, elevated  Permissive hypertension (OK if < 220/120) but gradually normalize in 5-7 days Long-term BP goal normotensive  Hyperlipidemia Home meds:  none LDL 169, not at goal < 70  High intensity statin is indicated, add atorvastatin 40mg   Continue statin at discharge  Diabetes type II Uncontrolled HgbA1c 8.1, goal < 7.0 CBGs Recent Labs    09/01/21 1658 09/02/21 1158  GLUCAP 190* 179*    [facility] PCP follow up for risk factor control   Other Stroke Risk Factors Obesity, Body mass index is 28.06 kg/m., BMI >/= 30 associated with increased stroke risk, recommend weight loss, diet and exercise as appropriate   Other Active Problems   Hospital day # 0  This patient was seen and evaluated with Dr. Pearlean BrownieSethi. He directed the plan of care.  Shon Haleelila Bailey-Modzik, NP-C     STROKE MD NOTE :  I have personally obtained history,examined this patient, reviewed notes, independently  viewed imaging studies, participated in medical decision making and plan of care.ROS completed by me personally and pertinent positives fully documented  I have made any additions or clarifications directly to the above note. Agree with note above.  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  Recommend aspirin and Plavix for 3 weeks followed by aspirin alone.  Continue ongoing stroke work-up.  Aggressive risk factor modification.  Physical occupational speech therapy consults.  Patient counseled to be compliant with his medications and medical follow-up and voiced understanding.  Discussed with Dr. Lowell Guitar.  Greater than 50% time during this 50-minute visit was spent in counseling and coordination of care about his medullary stroke discussion about evaluation and treatment and prevention plan and answering questions  Delia Heady, MD Medical Director [facility] Pager: [phone] 09/02/2021 2:53 PM  To contact Stroke Continuity provider, please refer to [email or web address]. After hours, contact General Neurology

## 2021-09-02 NOTE — Evaluation (Signed)
Clinical/Bedside Swallow Evaluation Patient Details  Name: Shawn Austin MRN: [id number] Date of Birth: 1965-05-26  Today's Date: 09/02/2021 Time: SLP Start Time (ACUTE ONLY): 1001 SLP Stop Time (ACUTE ONLY): 1015 SLP Time Calculation (min) (ACUTE ONLY): 14 min  Past Medical History:  Past Medical History:  Diagnosis Date   Anemia    Articular cartilage disorder of hip    Cerebral ischemia    Congenital deformity of right hip joint    Degeneration of cervical intervertebral disc    Diabetes mellitus without complication (HCC)    type 2   Fatigue    Hyperglycemia due to diabetes mellitus (HCC)    Hyperlipidemia    Hypertension    Hypocalcemia    Hyponatremia    Lower limb pain, inferior, right    Mild cognitive disorder    Pain in joint of left knee    Recurrent falls    Spondylolisthesis of lumbar region    Spondylolisthesis, lumbar region    Past Surgical History: History reviewed. No pertinent surgical history. HPI:  Pt is a 57 yo male presenting with new onset slurred speech and disequilibrium. MRI showed acute ischemia at the R dorsal lateral R medulla  oblongata. Pt did initially pass a [facility] swallow screen, but RN noted some concerns for dysphagia that she describes as slow swallowing. PMH also includes: mild cognitive disorder, stroke, DMII, HTN, HLD    Assessment / Plan / Recommendation  Clinical Impression  Pt appears to have slow mastication and posterior transit with solids with mild residue noted. Pt says since yesterday, he feels like he needs to chew really thoroughly or else it "doesn't go down." No overt s/s of aspiration are observed and he has passed a [facility] swallow screen x2. Recommend Dys 3 diet and thin liquids with ongoing SLP f/u. SLP Visit Diagnosis: Dysphagia, unspecified (R13.10)    Aspiration Risk  Mild aspiration risk    Diet Recommendation Dysphagia 3 (Mech soft);Thin liquid   Liquid Administration via: Cup;Straw Medication Administration:  Whole meds with liquid (can try whole in puree if having trouble coordinating with water) Supervision: Patient able to self feed;Intermittent supervision to cue for compensatory strategies;Comment (set-up assist for meals) Compensations: Slow rate;Small sips/bites;Follow solids with liquid Postural Changes: Seated upright at 90 degrees    Other  Recommendations Oral Care Recommendations: Oral care BID    Recommendations for follow up therapy are one component of a multi-disciplinary discharge planning process, led by the attending physician.  Recommendations may be updated based on patient status, additional functional criteria and insurance authorization.  Follow up Recommendations Acute inpatient rehab (3hours/day)      Assistance Recommended at Discharge Frequent or constant Supervision/Assistance  Functional Status Assessment Patient has had a recent decline in their functional status and demonstrates the ability to make significant improvements in function in a reasonable and predictable amount of time.  Frequency and Duration min 2x/week  2 weeks       Prognosis Prognosis for Safe Diet Advancement: Good      Swallow Study   General HPI: Pt is a 56 yo male presenting with new onset slurred speech and disequilibrium. MRI showed acute ischemia at the R dorsal lateral R medulla  oblongata. Pt did initially pass a [facility] swallow screen, but RN noted some concerns for dysphagia that she describes as slow swallowing. PMH also includes: mild cognitive disorder, stroke, DMII, HTN, HLD Type of Study: Bedside Swallow Evaluation Previous Swallow Assessment: none in chart Diet Prior to this  Study: Regular;Thin liquids Temperature Spikes Noted: No Respiratory Status: Room air History of Recent Intubation: No Behavior/Cognition: Alert;Cooperative;Pleasant mood;Requires cueing Oral Cavity Assessment: Within Functional Limits Oral Care Completed by SLP: No Oral Cavity - Dentition: Adequate  natural dentition Vision: Functional for self-feeding Self-Feeding Abilities: Able to feed self;Needs set up Patient Positioning: Upright in bed Baseline Vocal Quality: Normal Volitional Cough: Strong Volitional Swallow: Able to elicit    Oral/Motor/Sensory Function Overall Oral Motor/Sensory Function: Within functional limits   Ice Chips Ice chips: Not tested   Thin Liquid Thin Liquid: Within functional limits Presentation: Cup;Self Fed;Straw    Nectar Thick Nectar Thick Liquid: Not tested   Honey Thick Honey Thick Liquid: Not tested   Puree Puree: Not tested   Solid     Solid: Impaired Presentation: Self Fed;Spoon Oral Phase Functional Implications: Oral residue;Prolonged oral transit      Mahala Menghini., M.A. CCC-SLP Acute Rehabilitation Services Office [phone]  Secure chat preferred  09/02/2021,10:41 AM

## 2021-09-02 NOTE — Progress Notes (Signed)
STROKE TEAM PROGRESS NOTE   INTERVAL HISTORY No acute events, No visitors at bedside.  We discussed history, current symptoms, ongoing work up, diagnosis and plan of care. All questions were answered. He is focused on going home ASAP. Patience with the process and need to make a careful plan for his recovery explained.   Vitals:   09/02/21 1500 09/02/21 1514 09/02/21 1530 09/02/21 1545  BP: (!) 177/99  (!) 152/90 (!) 151/99  Pulse: 81  81 76  Resp: 17  13   Temp:  98.7 F (37.1 C)    TempSrc:  Oral    SpO2: 100%  100% 100%  Weight:      Height:       CBC:  Recent Labs  Lab 09/01/21 1755 09/01/21 1802 09/02/21 0427  WBC 5.1  --  5.5  NEUTROABS 3.5  --  3.2  HGB 13.0 13.6 13.5  HCT 39.2 40.0 39.7  MCV 85.2  --  83.6  PLT 408*  --  399   Basic Metabolic Panel:  Recent Labs  Lab 09/01/21 1755 09/01/21 1802 09/02/21 0427  NA 137 139 138  K 4.2 4.3 3.7  CL 102 105 108  CO2 24  --  26  GLUCOSE 198* 199* 150*  BUN 9 10 8   CREATININE 0.97 0.90 0.86  CALCIUM 9.1  --  8.9  MG  --   --  2.0   Lipid Panel:  Recent Labs  Lab 09/02/21 0427  CHOL 232*  TRIG 65  HDL 50  CHOLHDL 4.6  VLDL 13  LDLCALC 09/04/21*   HgbA1c:  Recent Labs  Lab 09/02/21 0427  HGBA1C 8.1*   Urine Drug Screen:  Recent Labs  Lab 09/01/21 2129  LABOPIA NONE DETECTED  COCAINSCRNUR NONE DETECTED  LABBENZ NONE DETECTED  AMPHETMU NONE DETECTED  THCU NONE DETECTED  LABBARB NONE DETECTED    Alcohol Level  Recent Labs  Lab 09/01/21 1755  ETH <10    IMAGING past 24 hours MR BRAIN WO CONTRAST  Result Date: 09/02/2021 CLINICAL DATA:  Facial droop EXAM: MRI HEAD WITHOUT CONTRAST TECHNIQUE: Multiplanar, multiecho pulse sequences of the brain and surrounding structures were obtained without intravenous contrast. COMPARISON:  None Available. FINDINGS: Brain: There is a small focus of abnormal diffusion restriction at the dorsal lateral right medulla oblongata. Chronic microhemorrhage in the right  caudate head. There is multifocal hyperintense T2-weighted signal within the white matter. Generalized volume loss. Old small vessel infarcts of the basal ganglia. The midline structures are normal. Vascular: Major flow voids are preserved. Skull and upper cervical spine: Normal calvarium and skull base. Visualized upper cervical spine and soft tissues are normal. Sinuses/Orbits:No paranasal sinus fluid levels or advanced mucosal thickening. No mastoid or middle ear effusion. Normal orbits. IMPRESSION: 1. Small focus of acute ischemia at the dorsal lateral right medulla oblongata. No hemorrhage or mass effect. 2. Findings of chronic ischemic microangiopathy and generalized volume loss. Electronically Signed   By: 09/04/2021 M.D.   On: 09/02/2021 00:26   CT CEREBRAL PERFUSION W CONTRAST  Result Date: 09/01/2021 CLINICAL DATA:  Right gaze.  Slurred speech. EXAM: CT PERFUSION BRAIN TECHNIQUE: Multiphase CT imaging of the brain was performed following IV bolus contrast injection. Subsequent parametric perfusion maps were calculated using RAPID software. RADIATION DOSE REDUCTION: This exam was performed according to the departmental dose-optimization program which includes automated exposure control, adjustment of the mA and/or kV according to patient size and/or use of iterative reconstruction technique. CONTRAST:  76mL OMNIPAQUE IOHEXOL 350 MG/ML SOLN COMPARISON:  CT studies earlier same day FINDINGS: CT Brain Perfusion Findings: CBF (<30%) Volume: 65mL Perfusion (Tmax>6.0s) volume: 49mL Mismatch Volume: 89mL ASPECTS on noncontrast CT Head: 10 at 1930 hours today. Infarct Core: 0 mL Infarction Location:None IMPRESSION: Negative perfusion study. This argues strongly against acute left anterior cerebral artery occlusion. Electronically Signed   By: Paulina Fusi M.D.   On: 09/01/2021 20:52   CT ANGIO HEAD NECK W WO CM (CODE STROKE)  Result Date: 09/01/2021 CLINICAL DATA:  Neuro deficit, acute, stroke suspected.  Slurred speech. Right gaze deviation. EXAM: CT ANGIOGRAPHY HEAD AND NECK TECHNIQUE: Multidetector CT imaging of the head and neck was performed using the standard protocol during bolus administration of intravenous contrast. Multiplanar CT image reconstructions and MIPs were obtained to evaluate the vascular anatomy. Carotid stenosis measurements (when applicable) are obtained utilizing NASCET criteria, using the distal internal carotid diameter as the denominator. RADIATION DOSE REDUCTION: This exam was performed according to the departmental dose-optimization program which includes automated exposure control, adjustment of the mA and/or kV according to patient size and/or use of iterative reconstruction technique. CONTRAST:  54mL OMNIPAQUE IOHEXOL 350 MG/ML SOLN COMPARISON:  Head CT earlier same day.  MRI 09/06/2020. FINDINGS: CTA NECK FINDINGS Aortic arch: Normal. No atherosclerotic change, aneurysm or dissection. Right carotid system: Common carotid artery widely patent to the bifurcation. Carotid bifurcation is normal without soft or calcified plaque. Cervical ICA is normal. Left carotid system: Similarly normal. Vertebral arteries: Both vertebral artery origins are patent. The right vertebral artery is a very small vessel. The left vertebral artery is dominant. Both vessels are patent through the cervical region to the foramen magnum. Skeleton: Ordinary cervical spondylosis. Other neck: No mass or lymphadenopathy. Upper chest: Lung apices are clear. Review of the MIP images confirms the above findings CTA HEAD FINDINGS Anterior circulation: Both internal carotid arteries are widely patent through the skull base and siphon regions. There is siphon atherosclerotic disease, worse on the right than the left, with stenosis possibly in the range of 50%. The anterior and middle cerebral vessels are patent. There is no large vessel occlusion. There is a moderate stenosis of the distal M1 segment on the left. There is  atherosclerotic narrowing and irregularity of the MCA branch vessels more diffusely on both sides. Patient may have variant anterior cerebral artery anatomy with diminutive left anterior cerebral vessel. However, I have some concern that there could be occlusion of the left anterior cerebral artery. Posterior circulation: Both vertebral arteries are patent through the foramen magnum. Both vessels reach the basilar. No basilar stenosis. PCA vessels receive most of there supply from the anterior circulation. There is considerable distal vessel atherosclerotic irregularity. Venous sinuses: Patent and normal. Anatomic variants: None significant. Review of the MIP images confirms the above findings IMPRESSION: No significant disease in the neck. There appears to be variant anterior cerebral anatomy, with a diminutive left anterior cerebral artery. However, I do have concern that the left anterior cerebral artery could be acutely occluded. Intracranial atherosclerotic disease diffusely affecting the anterior, middle and posterior cerebral artery vessels. This includes a moderate stenosis of the distal M1 segment on the left. See above discussion about the potential for left anterior cerebral artery occlusion. Atherosclerotic disease in both carotid siphon regions with stenosis approaching 50%, particularly on the right. Findings discussed with Dr. Derry Lory at 2010 hours. Electronically Signed   By: Paulina Fusi M.D.   On: 09/01/2021 20:23   CT HEAD CODE  STROKE WO CONTRAST`  Result Date: 09/01/2021 CLINICAL DATA:  Code stroke.  Right gaze and slurred speech EXAM: CT HEAD WITHOUT CONTRAST TECHNIQUE: Contiguous axial images were obtained from the base of the skull through the vertex without intravenous contrast. RADIATION DOSE REDUCTION: This exam was performed according to the departmental dose-optimization program which includes automated exposure control, adjustment of the mA and/or kV according to patient size  and/or use of iterative reconstruction technique. COMPARISON:  None Available. FINDINGS: Brain: There is no mass, hemorrhage or extra-axial collection. The size and configuration of the ventricles and extra-axial CSF spaces are normal. 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 Vascular: No abnormal hyperdensity of the major intracranial arteries or dural venous sinuses. No intracranial atherosclerosis. Skull: The visualized skull base, calvarium and extracranial soft tissues are normal. Sinuses/Orbits: No fluid levels or advanced mucosal thickening of the visualized paranasal sinuses. No mastoid or middle ear effusion. The orbits are normal. ASPECTS [facility] Stroke Program Early CT Score) - Ganglionic level infarction (caudate, lentiform nuclei, internal capsule, insula, M1-M3 cortex): 7 - Supraganglionic infarction (M4-M6 cortex): 3 Total score (0-10 with 10 being normal): 10 IMPRESSION: 1. No acute intracranial abnormality. 2. ASPECTS is 10. These results were communicated to Dr. Erick Blinks at 7:37 pm on 09/01/2021 by text page via the [facility] messaging system. Electronically Signed   By: Deatra Robinson M.D.   On: 09/01/2021 19:43    PHYSICAL EXAM General: Laying comfortably in bed; in no acute distress.  HENT: Normal oropharynx and mucosa. Normal external appearance of ears and nose.  Neck: Supple, no pain or tenderness  CV: No JVD. No peripheral edema.  Pulmonary: Symmetric Chest rise. Normal respiratory effort.  Abdomen: Soft to touch, non-tender.  Ext: No cyanosis, edema, or deformity  Skin: No rash. Normal palpation of skin.   Musculoskeletal: Normal digits and nails by inspection. No clubbing.    Neurologic Examination  Mental status/Cognition: Alert, oriented to self, place, month and year, good attention.  Speech/language: Dysarthric speech, fluent, comprehension intact, object naming intact, repetition intact.  Cranial nerves:   CN II Pupils equal and reactive to  light, no VF deficits    CN III,IV,VI EOM intact, mild R gaze preference but does cross midline. No gaze deviation, no nystagmus    CN V normal sensation in V1, V2, and V3 segments bilaterally    CN VII no asymmetry, no nasolabial fold flattening    CN VIII normal hearing to speech    CN IX & X normal palatal elevation, no uvular deviation    CN XI 5/5 head turn and 5/5 shoulder shrug bilaterally    CN XII midline tongue protrusion     Motor:  Muscle bulk: normal , tone normal, pronator drift none tremor none Mvmt Root Nerve  Muscle Right Left Comments  SA C5/6 Ax Deltoid 5 5    EF C5/6 Mc Biceps 5 5    EE C6/7/8 Rad Triceps 5 5    WF C6/7 Med FCR        WE C7/8 PIN ECU        F Ab C8/T1 U ADM/FDI 5 5    HF L1/2/3 Fem Illopsoas 5 5    KE L2/3/4 Fem Quad 5 5    DF L4/5 D Peron Tib Ant 5 5    PF S1/2 Tibial Grc/Sol 5 5      Sensation:  Light touch Intact throughout   ASSESSMENT/PLAN Shawn Austin is a 56 y.o. male  with PMH significant for DM2, HTN, HLD, degenerative spinal disc disease, who presents with leaning to the left and eyes deviated to the right and feeling off balance with falls over the last few days.   Patient recalls that he went to bed on Saturday around 2100 and woke up Sunday AM and felt like he was walking to his left when going to the bathroom.  He reports that in the afternoon, he fell while attempting to walk. He reports that he has had a couple falls since then including in the morning today. He reports that he felt that his speech was slightly slurred on Sunday.  then Monday 7/17 in the  afternoon, his sensation of disequilibrium  worsened, his speech is more slurred and he felt his coordination was poor with resultant falls. Presents with leaning to the left and R gaze preference and feeling off balance with falls over the last few days. Neuro exam with ataxia, left worse than right along with slurred speech. CT head   Small infarct of the dorsal lateral right  medulla oblongata due to SVD  CT Head without contrast: Negative for a large hypodensity concerning for a large territory infarct or hyperdensity concerning for an ICH CT angio Head and Neck with contrast: Multifocal multivessel stenosis with ? L ACA A1 segment occlusion CT Perfusion: No mismatch in the L ACA distribution and thus unlikely for this to be an acute L ACA occlusion. 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 MRI   Small focus of acute ischemia at the dorsal lateral right medulla oblongata. No hemorrhage or mass effect. Findings of chronic ischemic microangiopathy and generalized volume loss. 2D Echo PENDING LDL 169 HgbA1c 8.1 VTE prophylaxis - recommended    Diet   DIET DYS 3 Room service appropriate? Yes; Fluid consistency: Thin  Not on AC/AP prior to admission Recommend ASA 81mg  and Plavix 75 mg x 21 days followed by ASA 81mg  as monotherapy for life  Therapy recommendations:   Disposition:    Hypertension Unstable in 200s/110s on admission, improving Permissive hypertension (OK if < 220/120) but gradually normalize in 5-7 days Long-term BP goal normotensive Close PCP follow up  Hyperlipidemia Home meds:  None LDL 169, not at goal < 70 High intensity statin is indicated, start atorvastatin 80 mg  Continue statin at discharge Close PCP follow up  Diabetes type II Uncontrolled HgbA1c 8.1, goal < 7.0 CBGs Recent Labs    09/01/21 1658 09/02/21 1158  GLUCAP 190* 179*    SSI Close PCP follow up  Other Stroke Risk Factors Advanced Age >/= 66   Other Active Problems   Hospital day # 0  This patient was seen and evaluated with Dr. 09/04/21. He directed the plan of care.  76, NP-C   To contact Stroke Continuity provider, please refer to Pearlean Brownie. After hours, contact General Neurology

## 2021-09-02 NOTE — Assessment & Plan Note (Signed)
Follow mag, lytes Trend EKG Avoid qt prolonging meds

## 2021-09-02 NOTE — Hospital Course (Addendum)
Shawn Austin was admitted with the working diagnosis of acute CVA.   56 y.o. male with medical history significant for type 2 diabetes mellitus, hypertension, hyperlipidemia who is admitted to [facility] on 09/01/2021 with acute ischemic stroke after presenting from home to [facility] ED complaining of disequilibrium.  Patient had acute onset of disequilibrium and slurred speech noticed after waking up. His symptoms were persistent prompting him to come to the ED. On his initial physical examination his blood pressure was 182/111, HR 89, RR 24 and 02 saturation 100%,lungs with no wheezing or rhonchi, heart with S1 and S2 present and rhythmic with no gallops, rubs or murmurs, abdomen not distended and no lower extremity edema, strength was preserved.   Na 138, K 3,7 Cl 108, bicarbonate 26 glucose 150, bun 8 cr 0,86  LDL 169 cholesterol 232  Wbc 5.5 hgb 13.5 plt 390  Sars covid 19 negative  Urine analysis with SG >1,046 protein 300   Head ct negative for acute changes.   EKG 84 bpm, normal axis, qtc 508, right bundle branch block with sinus rhythm, no significant ST segment or T wave changes.   MRI showed acute ischemia at the dorsal lateral right medulla oblongata.    Neurology has recommended aspirin and clopidogrel for 3 weeks and the continue with aspirin alone.   No beds at the [facility] and patient unfortunately has no insurance in order to be transferred to SNF. Plan to discharge home when he becomes more mobile.

## 2021-09-02 NOTE — Assessment & Plan Note (Addendum)
Blood pressure has been elevated, permissive hypertension.  Blood pressure continue to be elevated 193/108 and 178/104   Continue with  lisinopril and will add diuretic therapy with HCTZ.  Continue close blood pressure monitoring.

## 2021-09-02 NOTE — Evaluation (Signed)
Speech Language Pathology Evaluation Patient Details Name: Shawn Austin MRN: [id number] DOB: 25-Oct-1965 Today's Date: 09/02/2021 Time: 2637-8588 SLP Time Calculation (min) (ACUTE ONLY): 19 min  Problem List:  Patient Active Problem List   Diagnosis Date Noted   Acute ischemic stroke (HCC) 09/02/2021   HLD (hyperlipidemia) 09/02/2021   DM2 (diabetes mellitus, type 2) (HCC) 09/07/2012   Essential hypertension, benign 09/07/2012   Past Medical History:  Past Medical History:  Diagnosis Date   Anemia    Articular cartilage disorder of hip    Cerebral ischemia    Congenital deformity of right hip joint    Degeneration of cervical intervertebral disc    Diabetes mellitus without complication (HCC)    type 2   Fatigue    Hyperglycemia due to diabetes mellitus (HCC)    Hyperlipidemia    Hypertension    Hypocalcemia    Hyponatremia    Lower limb pain, inferior, right    Mild cognitive disorder    Pain in joint of left knee    Recurrent falls    Spondylolisthesis of lumbar region    Spondylolisthesis, lumbar region    Past Surgical History: History reviewed. No pertinent surgical history. HPI:  Pt is a 56 yo male presenting with new onset slurred speech and disequilibrium. MRI showed acute ischemia at the R dorsal lateral R medulla  oblongata. Pt did initially pass a Yale swallow screen, but RN noted some concerns for dysphagia that she describes as slow swallowing. PMH also includes: mild cognitive disorder, stroke, DMII, HTN, HLD   Assessment / Plan / Recommendation Clinical Impression  Pt presents with impaired cognition and communication, with documented h/o mild cognitive impairment but with pt describing acute changes starting on previous date. His speech is dysarthric, characterized by slow rate, monotonous inflection, and minimally imprecise articulation. He has decreased organization of thought with intermittent word-finding errors. He is oriented and knows he had a  stroke, but has limited emergent and anticipatory awareness to understand implications. Some of the history he provides is also conflicting, such as when he tells me that he was presently working at the [employer], then later saying he has been out of work since 2021. He is very eager to leave and go back to work. SLP provided heavy cueing and lots of repetition for immediate recall of four words; he later was able to recall 2/4, increasing only to 3/4 with multiple choice cues, overall suggesting reduced storage. He says his memory was "really good" until yesterday. Given what apepars to be acute on chronic changes, recommend LSP f/u to maximize cognition and communicaiton.    SLP Assessment  SLP Recommendation/Assessment: Patient needs continued Speech Lanaguage Pathology Services SLP Visit Diagnosis: Cognitive communication deficit (R41.841);Dysarthria and anarthria (R47.1)    Recommendations for follow up therapy are one component of a multi-disciplinary discharge planning process, led by the attending physician.  Recommendations may be updated based on patient status, additional functional criteria and insurance authorization.    Follow Up Recommendations  Acute inpatient rehab (3hours/day)    Assistance Recommended at Discharge  Frequent or constant Supervision/Assistance  Functional Status Assessment Patient has had a recent decline in their functional status and demonstrates the ability to make significant improvements in function in a reasonable and predictable amount of time.  Frequency and Duration min 2x/week  2 weeks      SLP Evaluation Cognition  Overall Cognitive Status: No family/caregiver present to determine baseline cognitive functioning Arousal/Alertness: Awake/alert Orientation Level: Oriented X4 Attention:  Sustained Sustained Attention: Impaired Sustained Attention Impairment: Verbal basic Memory: Impaired Memory Impairment: Storage deficit;Decreased recall of new  information Awareness: Impaired Awareness Impairment: Emergent impairment;Anticipatory impairment Problem Solving: Impaired Problem Solving Impairment: Functional basic Safety/Judgment: Impaired       Comprehension  Auditory Comprehension Overall Auditory Comprehension: Appears within functional limits for tasks assessed    Expression Expression Primary Mode of Expression: Verbal Verbal Expression Overall Verbal Expression: Other (comment) (decreased organization of thought, word-finding)   Oral / Motor  Oral Motor/Sensory Function Overall Oral Motor/Sensory Function: Within functional limits Motor Speech Overall Motor Speech: Impaired (slow rate) Respiration: Within functional limits Phonation: Other (comment) (monotonous) Resonance: Within functional limits Articulation: Impaired Level of Impairment: Conversation Intelligibility: Intelligible            Mahala Menghini., M.A. CCC-SLP Acute Rehabilitation Services Office [phone]  Secure chat preferred  09/02/2021, 10:50 AM

## 2021-09-02 NOTE — ED Notes (Signed)
Patient is resting comfortably. 

## 2021-09-02 NOTE — Assessment & Plan Note (Addendum)
Fasting glucose this am 150, capillary glucose with hyperglycemia 249, 169 and 218. Will resume metformin and will continue with insulin sliding scale for glucose cover and monitoring.  At home patient on 10 mg of glipizide daily.    Continue with statin therapy.

## 2021-09-03 ENCOUNTER — Inpatient Hospital Stay (HOSPITAL_COMMUNITY): Payer: Medicaid Other

## 2021-09-03 DIAGNOSIS — E1169 Type 2 diabetes mellitus with other specified complication: Secondary | ICD-10-CM

## 2021-09-03 DIAGNOSIS — I6389 Other cerebral infarction: Secondary | ICD-10-CM

## 2021-09-03 DIAGNOSIS — D649 Anemia, unspecified: Secondary | ICD-10-CM | POA: Diagnosis present

## 2021-09-03 LAB — CBC WITH DIFFERENTIAL/PLATELET
Abs Immature Granulocytes: 0.02 10*3/uL (ref 0.00–0.07)
Basophils Absolute: 0 10*3/uL (ref 0.0–0.1)
Basophils Relative: 1 %
Eosinophils Absolute: 0.1 10*3/uL (ref 0.0–0.5)
Eosinophils Relative: 1 %
HCT: 36.1 % — ABNORMAL LOW (ref 39.0–52.0)
Hemoglobin: 12.5 g/dL — ABNORMAL LOW (ref 13.0–17.0)
Immature Granulocytes: 0 %
Lymphocytes Relative: 35 %
Lymphs Abs: 2.2 10*3/uL (ref 0.7–4.0)
MCH: 28.2 pg (ref 26.0–34.0)
MCHC: 34.6 g/dL (ref 30.0–36.0)
MCV: 81.3 fL (ref 80.0–100.0)
Monocytes Absolute: 0.5 10*3/uL (ref 0.1–1.0)
Monocytes Relative: 8 %
Neutro Abs: 3.5 10*3/uL (ref 1.7–7.7)
Neutrophils Relative %: 55 %
Platelets: 390 10*3/uL (ref 150–400)
RBC: 4.44 MIL/uL (ref 4.22–5.81)
RDW: 12.8 % (ref 11.5–15.5)
WBC: 6.3 10*3/uL (ref 4.0–10.5)
nRBC: 0 % (ref 0.0–0.2)

## 2021-09-03 LAB — COMPREHENSIVE METABOLIC PANEL
ALT: 15 U/L (ref 0–44)
AST: 19 U/L (ref 15–41)
Albumin: 3.4 g/dL — ABNORMAL LOW (ref 3.5–5.0)
Alkaline Phosphatase: 81 U/L (ref 38–126)
Anion gap: 11 (ref 5–15)
BUN: 10 mg/dL (ref 6–20)
CO2: 26 mmol/L (ref 22–32)
Calcium: 8.9 mg/dL (ref 8.9–10.3)
Chloride: 102 mmol/L (ref 98–111)
Creatinine, Ser: 1.07 mg/dL (ref 0.61–1.24)
GFR, Estimated: >60 mL/min (ref >60)
Glucose, Bld: 165 mg/dL — ABNORMAL HIGH (ref 70–99)
Potassium: 3.3 mmol/L — ABNORMAL LOW (ref 3.5–5.1)
Sodium: 139 mmol/L (ref 135–145)
Total Bilirubin: 1.5 mg/dL — ABNORMAL HIGH (ref 0.3–1.2)
Total Protein: 6.5 g/dL (ref 6.5–8.1)

## 2021-09-03 LAB — GLUCOSE, CAPILLARY
Glucose-Capillary: 154 mg/dL — ABNORMAL HIGH (ref 70–99)
Glucose-Capillary: 173 mg/dL — ABNORMAL HIGH (ref 70–99)
Glucose-Capillary: 197 mg/dL — ABNORMAL HIGH (ref 70–99)
Glucose-Capillary: 218 mg/dL — ABNORMAL HIGH (ref 70–99)
Glucose-Capillary: 321 mg/dL — ABNORMAL HIGH (ref 70–99)

## 2021-09-03 LAB — ECHOCARDIOGRAM COMPLETE
Area-P 1/2: 3.4 cm2
Height: 69 in
S' Lateral: 3.3 cm
Weight: 2934.76 oz

## 2021-09-03 LAB — PHOSPHORUS: Phosphorus: 3.9 mg/dL (ref 2.5–4.6)

## 2021-09-03 LAB — MAGNESIUM: Magnesium: 1.9 mg/dL (ref 1.7–2.4)

## 2021-09-03 MED ORDER — POTASSIUM CHLORIDE CRYS ER 20 MEQ PO TBCR
40.0000 meq | EXTENDED_RELEASE_TABLET | ORAL | Status: AC
  Administered 2021-09-03 (×2): 40 meq via ORAL
  Filled 2021-09-03 (×3): qty 2

## 2021-09-03 MED ORDER — LISINOPRIL 10 MG PO TABS
10.0000 mg | ORAL_TABLET | Freq: Every day | ORAL | Status: DC
  Administered 2021-09-03 – 2021-09-05 (×3): 10 mg via ORAL
  Filled 2021-09-03 (×3): qty 1

## 2021-09-03 MED ORDER — ORAL CARE MOUTH RINSE: 15.0000 mL | OROMUCOSAL | Status: DC | PRN

## 2021-09-03 NOTE — Progress Notes (Signed)
STROKE TEAM PROGRESS NOTE   INTERVAL HISTORY No visitors at bedside Eating well. Still complains of mild vision difficulties but improving.  Vital signs stable.  Neurological exam unchanged.  Echocardiogram is pending Vitals:   09/02/21 2353 09/03/21 0010 09/03/21 0423 09/03/21 0700  BP: (!) 187/116 (!) 171/92 (!) 193/116 (!) 134/110  Pulse: 85 84 93 97  Resp: 19 19 13 16   Temp: 98.2 F (36.8 C)  97.8 F (36.6 C) 98.3 F (36.8 C)  TempSrc: Oral  Oral Oral  SpO2: 100% 100% 100% 100%  Weight:      Height:       CBC:  Recent Labs  Lab 09/02/21 0427 09/03/21 0417  WBC 5.5 6.3  NEUTROABS 3.2 3.5  HGB 13.5 12.5*  HCT 39.7 36.1*  MCV 83.6 81.3  PLT 399 390   Basic Metabolic Panel:  Recent Labs  Lab 09/02/21 0427 09/03/21 0417  NA 138 139  K 3.7 3.3*  CL 108 102  CO2 26 26  GLUCOSE 150* 165*  BUN 8 10  CREATININE 0.86 1.07  CALCIUM 8.9 8.9  MG 2.0 1.9  PHOS  --  3.9   Lipid Panel:  Recent Labs  Lab 09/02/21 0427  CHOL 232*  TRIG 65  HDL 50  CHOLHDL 4.6  VLDL 13  LDLCALC 09/04/21*   HgbA1c:  Recent Labs  Lab 09/02/21 0427  HGBA1C 8.1*   Urine Drug Screen:  Recent Labs  Lab 09/01/21 2129  LABOPIA NONE DETECTED  COCAINSCRNUR NONE DETECTED  LABBENZ NONE DETECTED  AMPHETMU NONE DETECTED  THCU NONE DETECTED  LABBARB NONE DETECTED    Alcohol Level  Recent Labs  Lab 09/01/21 1755  ETH <10    IMAGING past 24 hours No results found.  PHYSICAL EXAM General: Laying comfortably in bed; in no acute distress.  HENT: Normal oropharynx and mucosa. Normal external appearance of ears and nose.  Neck: Supple, no pain or tenderness  CV: No JVD. No peripheral edema.  Pulmonary: Symmetric Chest rise. Normal respiratory effort.  Abdomen: Soft to touch, non-tender.  Ext: No cyanosis, edema, or deformity  Skin: No rash. Normal palpation of skin.   Musculoskeletal: Normal digits and nails by inspection. No clubbing.    Neurologic Examination  Mental  status/Cognition: Alert, oriented to self, place, month and year, good attention.  Speech/language: Dysarthric speech, fluent, comprehension intact, object naming intact, repetition intact.  Cranial nerves:   CN II Pupils equal and reactive to light, no VF deficits    CN III,IV,VI EOM intact, mild R gaze preference but does cross midline. No gaze deviation, + right nystagmus.  Mild esotropia of the left eye   CN V normal sensation in V1, V2, and V3 segments bilaterally    CN VII no asymmetry, no nasolabial fold flattening    CN VIII normal hearing to speech    CN IX & X normal palatal elevation, no uvular deviation    CN XI 5/5 head turn and 5/5 shoulder shrug bilaterally    CN XII midline tongue protrusion     Motor:  Muscle bulk: normal , tone normal, pronator drift none tremor none Mvmt Root Nerve  Muscle Right Left Comments  SA C5/6 Ax Deltoid 5 5    EF C5/6 Mc Biceps 5 5    EE C6/7/8 Rad Triceps 5 5    WF C6/7 Med FCR        WE C7/8 PIN ECU        F Ab C8/T1  U ADM/FDI 5 5    HF L1/2/3 Fem Illopsoas 5 5    KE L2/3/4 Fem Quad 5 5    DF L4/5 D Peron Tib Ant 5 5    PF S1/2 Tibial Grc/Sol 5 5      Reflexes:   Right Left Comments  Pectoralis         Biceps (C5/6) 2 2    Brachioradialis (C5/6) 2 2     Triceps (C6/7) 2 2     Patellar (L3/4) 2 2     Achilles (S1)         Hoffman         Plantar        Jaw jerk       Sensation:  Light touch Intact throughout   Pin prick     Temperature     Vibration    Proprioception      Coordination/Complex Motor:  - Finger to Nose with ataxia BL - Heel to shin with ataxia BL - Rapid alternating movement are normal - Gait: Deferred.    ASSESSMENT/PLAN Shawn Austin is a 56 y.o. male with PMH significant for DM2, HTN, HLD, degenerative spinal disc disease, who presents with leaning to the left and R gaze preference and feeling off balance with falls over the last few days. Neuro exam with ataxia, left worse than right along with  slurred speech. Highest suspicion for a small vessel stroke in the posterior circulation.  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 L ACA A1 segment occlusion  CT Perfusion: No mismatch in the L ACA distribution and thus unlikely for this to be an acute L ACA occlusion. 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 MRI: Small focus of acute ischemia at the dorsal lateral right medulla oblongata. No hemorrhage or mass effect.Findings of chronic ischemic microangiopathy and generalized volume loss. 2D Echo PENDING LDL 169 HgbA1c 8.1 VTE prophylaxis - recommended    Diet   DIET DYS 3 Room service appropriate? Yes; Fluid consistency: Thin  No AC/AP prior to admission  ASA 81mg  and Plavix 75 mg x 3 weeks then ASA 81mg  alone Therapy recommendations:  IPR Disposition:  TBD  Hypertension Stable, elevated  Permissive hypertension (OK if < 220/120) but gradually normalize in 5-7 days Long-term BP goal normotensive  Hyperlipidemia Home meds:  none LDL 169, not at goal < 70  High intensity statin is indicated, add atorvastatin 40mg   Continue statin at discharge  Diabetes type II Uncontrolled HgbA1c 8.1, goal < 7.0 CBGs Recent Labs    09/03/21 0003 09/03/21 0615 09/03/21 1202  GLUCAP 154* 173* 321*    [facility] PCP follow up for risk factor control   Other Stroke Risk Factors Obesity, Body mass index is 27.09 kg/m., BMI >/= 30 associated with increased stroke risk, recommend weight loss, diet and exercise as appropriate   Other Active Problems   Hospital day # 1  Patient presented with dysarthria and ataxia secondary to right lateral medullary infarct likely from small vessel disease.  Recommend aspirin and Plavix for 3  weeks followed by aspirin alone.  Continue aggressive risk factor modification.  Physical occupational speech therapy consults.  Patient counseled to be compliant with his medications and medical follow-up and voiced understanding.  Discussed with Dr. Marlin Canary.  Follow echo results.  Stroke team will sign off.  Follow-up with outpatient stroke clinic in 2 months.  Kindly call for questions if any.  Greater than 50% time during this 25 minute visit was spent in counseling and coordination of care about his medullary stroke discussion about evaluation and treatment and prevention plan and answering questions  Delia Heady, MD Medical Director [facility] Pager: [phone] 09/03/2021 1:49 PM  To contact Stroke Continuity provider, please refer to [email or web address]. After hours, contact General Neurology

## 2021-09-03 NOTE — Progress Notes (Signed)
Progress Note   Patient: Shawn Austin:[id number] DOB: 21-Nov-1965 DOA: 09/01/2021     1 DOS: the patient was seen and examined on 09/03/2021   Brief hospital course: Shawn Austin was admitted with the working diagnosis of acute CVA.   56 y.o. male with medical history significant for type 2 diabetes mellitus, hypertension, hyperlipidemia who is admitted to [facility] on 09/01/2021 with acute ischemic stroke after presenting from home to [facility] ED complaining of disequilibrium.  Patient had acute onset of disequilibrium and slurred speech noticed after waking up. His symptoms were persistent prompting him to come to the ED. On his initial physical examination his blood pressure was 182/111, HR 89, RR 24 and 02 saturation 100%,lungs with no wheezing or rhonchi, heart with S1 and S2 present and rhythmic with no gallops, rubs or murmurs, abdomen not distended and no lower extremity edema, strength was preserved.   Na 138, K 3,7 Cl 108, bicarbonate 26 glucose 150, bun 8 cr 0,86  LDL 169 cholesterol 232  Wbc 5.5 hgb 13.5 plt 390  Sars covid 19 negative  Urine analysis with SG >1,046 protein 300   Head ct negative for acute changes.   EKG 84 bpm, normal axis, qtc 508, right bundle branch block with sinus rhythm, no significant ST segment or T wave changes.   MRI showed acute ischemia at the dorsal lateral right medulla oblongata.    Neurology has recommended aspirin and clopidogrel for 3 weeks and the continue with aspirin alone.   Assessment and Plan: * Acute ischemic stroke [facility]) MRI with small focus of acute ischemia at dorsal lateral right medulla oblongata  CTA head/neck with variant anteriorcerebral anatoma, diminutive L anterior cerebral artery (concern possibly acutely occluded).  Intrcranial atherosclerotic disease diffusely affecting the anterior, middle, and posterior cerebral artery vessels.  Moderate stenosis of distal M1 segment on L. Atherosclerotic disease in both  carotid siphon regions with stenosis approaching 50% particularly on the R  Echocardiogram with preserved LV systolic function.   Patient with improvement in neurologic deficit.  Plan to continue outpatient PT and OT Dual antiplatelet therapy with aspirin and clopidogrel, then continue with aspirin alone.    Essential hypertension, benign Blood pressure has been elevated, permissive hypertension. Patient not on antihypertensive therapy prior to admission.  Will start patient on lisinopril and have close follow up as outpatient for further tight blood pressure control.    Type 2 diabetes mellitus with hyperlipidemia (HCC) His glucose remained controlled during his hospitalization  Fasting glucose at the time of his discharge is 165 mg/dl.   Plan to resume metformin at the time of discharge and follow up as outpatient.   Continue with statin therapy.   Anemia Chronic anemia, hgb has been stable 13 to 12. Follow up as outpatient.         Subjective: Patient with improving dizziness, no chest pain   Physical Exam: Vitals:   09/02/21 2353 09/03/21 0010 09/03/21 0423 09/03/21 0700  BP: (!) 187/116 (!) 171/92 (!) 193/116 (!) 134/110  Pulse: 85 84 93 97  Resp: 19 19 13 16   Temp: 98.2 F (36.8 C)  97.8 F (36.6 C) 98.3 F (36.8 C)  TempSrc: Oral  Oral Oral  SpO2: 100% 100% 100% 100%  Weight:      Height:       Neurology awake and alert ENT with no pallor Cardiovascular with S1 and S2 present and rhythmic Respiratory with no rales or wheezing Abdomen not distended No  lower extremity edema  Data Reviewed:    Family Communication: no family at the bedside   Disposition: Status is: Inpatient Remains inpatient appropriate because: pending CIR   Planned Discharge Destination: Home    Author: Coralie Keens, MD 09/03/2021 3:55 PM  For on call review [email or web address].

## 2021-09-03 NOTE — Assessment & Plan Note (Signed)
Chronic anemia, hgb has been stable 13 to 12. Follow up as outpatient.

## 2021-09-03 NOTE — TOC Initial Note (Signed)
Transition of Care [facility]) - Initial/Assessment Note    Patient Details  Name: Shawn Austin MRN: [id number] Date of Birth: Nov 20, 1965  Transition of Care [facility]) CM/SW Contact:    Kermit Balo, RN Phone Number: 09/03/2021, 1:30 PM  Clinical Narrative:                 Pt lives at home with his fiance. She works during the daytime. He states his cousin may be able to fill in while his fiance is at work.  He was driving pre hospitalization. He states he was not taking any medications. No PCP.  CM inquired about one of the [facility] and he was interested. Will need an appointment closer to d/c.  Recommendation are for CIR.  TOC following.  Expected Discharge Plan: IP Rehab Facility Barriers to Discharge: Continued Medical Work up, Inadequate or no insurance   Patient Goals and CMS Choice   CMS Medicare.gov Compare Post Acute Care list provided to:: Patient Choice offered to / list presented to : Patient  Expected Discharge Plan and Services Expected Discharge Plan: IP Rehab Facility   Discharge Planning Services: CM Consult Post Acute Care Choice: IP Rehab Living arrangements for the past 2 months: Single Family Home                                      Prior Living Arrangements/Services Living arrangements for the past 2 months: Single Family Home Lives with:: Significant Other Patient language and need for interpreter reviewed:: Yes Do you feel safe going back to the place where you live?: Yes        Care giver support system in place?:  (potentially) Current home services: DME (cane) Criminal Activity/Legal Involvement Pertinent to Current Situation/Hospitalization: No - Comment as needed  Activities of Daily Living Home Assistive Devices/Equipment: Cane (specify quad or straight), Blood pressure cuff, Eyeglasses, Crutches ADL Screening (condition at time of admission) Patient's cognitive ability adequate to safely complete daily activities?: Yes Is the  patient deaf or have difficulty hearing?: No Does the patient have difficulty seeing, even when wearing glasses/contacts?: Yes Does the patient have difficulty concentrating, remembering, or making decisions?: Yes Patient able to express need for assistance with ADLs?: Yes Does the patient have difficulty dressing or bathing?: No Independently performs ADLs?: No Communication: Independent Dressing (OT): Independent Grooming: Independent Feeding: Independent Bathing: Needs assistance Is this a change from baseline?: Change from baseline, expected to last <3 days Toileting: Needs assistance Is this a change from baseline?: Change from baseline, expected to last >3days In/Out Bed: Needs assistance Is this a change from baseline?: Change from baseline, expected to last >3 days Walks in Home: Needs assistance Is this a change from baseline?: Change from baseline, expected to last >3 days Does the patient have difficulty walking or climbing stairs?: No Weakness of Legs: Both Weakness of Arms/Hands: None  Permission Sought/Granted                  Emotional Assessment Appearance:: Appears stated age Attitude/Demeanor/Rapport: Engaged Affect (typically observed): Accepting Orientation: : Oriented to Self, Oriented to Place, Oriented to  Time, Oriented to Situation   Psych Involvement: No (comment)  Admission diagnosis:  Acute ischemic stroke [facility]) [I63.9] Patient Active Problem List   Diagnosis Date Noted   Acute ischemic stroke (HCC) 09/02/2021   HLD (hyperlipidemia) 09/02/2021   Prolonged QT interval 09/02/2021   DM2 (  diabetes mellitus, type 2) (HCC) 09/07/2012   Essential hypertension, benign 09/07/2012   PCP:  Judee Clara, FNP Pharmacy:   [id number] - [place], [place] - [address] [place] [address] Phone: [phone] Fax: [phone]     Social Determinants of Health (SDOH) Interventions    Readmission Risk  Interventions     No data to display

## 2021-09-03 NOTE — Progress Notes (Signed)
  Echocardiogram 2D Echocardiogram has been performed.  Shawn Austin F 09/03/2021, 1:03 PM

## 2021-09-03 NOTE — Progress Notes (Signed)
? ?  Inpatient Rehab Admissions Coordinator : ? ?Per therapy recommendations, patient was screened for CIR candidacy by Serai Tukes RN MSN.  At this time patient appears to be a potential candidate for CIR. I will place a rehab consult per protocol for full assessment. Please call me with any questions. ? ?Jeni Duling RN MSN ?Admissions Coordinator ?[phone] ?  ?

## 2021-09-03 NOTE — Evaluation (Signed)
Occupational Therapy Evaluation Patient Details Name: Shawn Austin MRN: [id number] DOB: 29-Aug-1965 Today's Date: 09/03/2021   History of Present Illness 56 y.o. 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   Clinical Impression   PTA patient independent and driving. Admitted for above and presents with problem list below, including impaired balance, vision, cognition, coordination and activity tolerance. Patient oriented and follows simple commands with increased time, demonstrates decreased problem solving, awareness, safety and attention throughout session. Decreased BUE coordination with finger to nose, questionable diplopia maybe affecting- see below for details on vision assessment.  He completes ADLs with min - max assist, sit to stand transfers with mod assist +2 and squat pivot transfers with min assist +2; statically sitting EOB relies on R UE support and requires constant cueing to maintain at midline.  Based on performance today, believe he will best benefit from continued OT services acutely and after dc at AIR level to optimize independence and safety. Will follow.      Recommendations for follow up therapy are one component of a multi-disciplinary discharge planning process, led by the attending physician.  Recommendations may be updated based on patient status, additional functional criteria and insurance authorization.   Follow Up Recommendations  Acute inpatient rehab (3hours/day)    Assistance Recommended at Discharge Frequent or constant Supervision/Assistance  Patient can return home with the following Two people to help with walking and/or transfers;A lot of help with bathing/dressing/bathroom;Assistance with cooking/housework;Direct  supervision/assist for medications management;Direct supervision/assist for financial management;Assist for transportation;Help with stairs or ramp for entrance    Functional Status Assessment  Patient has had a recent decline in their functional status and demonstrates the ability to make significant improvements in function in a reasonable and predictable amount of time.  Equipment Recommendations  Other (comment) (defer)    Recommendations for Other Services Speech consult     Precautions / Restrictions Precautions Precautions: Fall Restrictions Weight Bearing Restrictions: No      Mobility Bed Mobility Overal bed mobility: Needs Assistance Bed Mobility: Supine to Sit     Supine to sit: Min guard     General bed mobility comments: for safety, increased time and effort towards L side    Transfers Overall transfer level: Needs assistance Equipment used: 2 person hand held assist Transfers: Sit to/from Stand, Bed to chair/wheelchair/BSC Sit to Stand: Mod assist, +2 safety/equipment, +2 physical assistance   Squat pivot transfers: Min assist, +2 physical assistance, +2 safety/equipment       General transfer comment: power up from EOB with mod assist +2 with R lateral lean in standing, unable to correct without max physical cueing to find midline and unable to sustain without physical assist.  Squat pivot to L side, reaching with L UE to arm rest and min assist +2 to pivot into chair (turning hips).      Balance Overall balance assessment: Needs assistance Sitting-balance support: No upper extremity supported, Feet supported Sitting balance-Leahy Scale: Poor Sitting balance - Comments: relies on R UE support sitting Postural control: Posterior lean Standing balance support: Bilateral upper extremity supported, During functional activity Standing balance-Leahy Scale: Poor Standing balance comment: relies on BUE and external support  ADL either performed or assessed with clinical judgement   ADL Overall ADL's : Needs assistance/impaired     Grooming: Minimal assistance;Sitting   Upper Body Bathing: Minimal assistance;Sitting   Lower Body Bathing: Maximal assistance;Sit to/from stand   Upper Body Dressing : Minimal assistance;Sitting   Lower Body Dressing: Maximal assistance;Sit to/from stand;Sitting/lateral leans   Toilet Transfer: Moderate assistance;Squat-pivot Toilet Transfer Details (indicate cue type and reason): simulated to recliner         Functional mobility during ADLs: Moderate assistance;+2 for physical assistance;+2 for safety/equipment General ADL Comments: limited by balance, coordination, vision and cognition     Vision Baseline Vision/History: 1 Wears glasses Ability to See in Adequate Light: 0 Adequate Patient Visual Report: Other (comment) (reports "I can't tell you what is different") Vision Assessment?: Yes Eye Alignment: Impaired (comment) (eyes shifted to R side of midline) Ocular Range of Motion: Restricted on the left (able to scan to L side with increased time and effort) Alignment/Gaze Preference: Gaze right;Head tilt Tracking/Visual Pursuits: Impaired - to be further tested in functional context (difficulty scanning and maintaining focus on L side with decreased smoothnesss (both eyes)) Visual Fields:  (diffuclty with L upper quadrant with identifiying correct number of fingers) Diplopia Assessment: Other (comment) (further assessment) Additional Comments: patient reports recently getting glasses for L eye blurry vision in December.  Does not have glasses present.  Appears with R gaze and difficulty scanning to L, beating towards L when scanning towards R nystagmus.  Pt with poor attention to focus on L side sustained.  He requires minimal head turn to fully reach L side with eyes.  Initally reports seeing 2 name tags and 2 keys (1 present) but otherwise denies diplopia,  although appears to not under/overshoot with depth perception.  Continue assessment.     Perception Perception Comments: inattention vs neglect? tends to scoot towards R side at EOB, does not turn head to L unless cued.   Praxis      Pertinent Vitals/Pain Pain Assessment Pain Assessment: No/denies pain     Hand Dominance Right   Extremity/Trunk Assessment Upper Extremity Assessment Upper Extremity Assessment: RUE deficits/detail;LUE deficits/detail RUE Deficits / Details: strength WFL, decreased GMC with finger to nose testing RUE Sensation: WNL RUE Coordination: decreased gross motor LUE Deficits / Details: strength WFL, decreased fine and gross motor coordination with testing LUE Sensation: WNL LUE Coordination: decreased gross motor;decreased fine motor   Lower Extremity Assessment Lower Extremity Assessment: Defer to PT evaluation   Cervical / Trunk Assessment Cervical / Trunk Assessment: Other exceptions Cervical / Trunk Exceptions: rt lateral shift in sitting and standing   Communication Communication Communication: Expressive difficulties (slurred speech)   Cognition Arousal/Alertness: Awake/alert Behavior During Therapy: Impulsive Overall Cognitive Status: Impaired/Different from baseline Area of Impairment: Attention, Following commands, Safety/judgement, Awareness, Problem solving, Memory                   Current Attention Level: Sustained Memory: Decreased short-term memory Following Commands: Follows one step commands with increased time Safety/Judgement: Decreased awareness of safety, Decreased awareness of deficits Awareness: Emergent Problem Solving: Slow processing, Difficulty sequencing, Requires verbal cues, Requires tactile cues General Comments: patient aware of R lateral lean but unable to correct without mulitmodal cueing, poor carryover of techniques and decreased awareness of safety during session.     General Comments  BP elevated but  stable    Exercises     Shoulder Instructions      Home Living Family/patient expects to be  discharged to:: Private residence Living Arrangements: Spouse/significant other;Children Available Help at Discharge: Family;Available PRN/intermittently (son and girlfriend) Type of Home: House Home Access: Stairs to enter Secretary/administrator of Steps: 5 Entrance Stairs-Rails: Right Home Layout: Two level;1/2 bath on main level;Bed/bath upstairs (could stay on 1st floor if needed) Alternate Level Stairs-Number of Steps: 20 Alternate Level Stairs-Rails: Right Bathroom Shower/Tub: Tub/shower unit;Walk-in shower   Bathroom Toilet: Standard     Home Equipment: None          Prior Functioning/Environment Prior Level of Function : Independent/Modified Independent;Driving               ADLs Comments: reports independent with ADLs; takes son to school (junior high- 6th grade) and then just "relaxes"; questionable historion pt initally reports not driving and then reports driving son to school        OT Problem List: Decreased activity tolerance;Impaired balance (sitting and/or standing);Impaired vision/perception;Decreased coordination;Decreased cognition;Decreased safety awareness;Decreased knowledge of use of DME or AE;Decreased knowledge of precautions;Impaired UE functional use      OT Treatment/Interventions: Self-care/ADL training;Neuromuscular education;DME and/or AE instruction;Therapeutic activities;Balance training;Patient/family education;Cognitive remediation/compensation;Visual/perceptual remediation/compensation    OT Goals(Current goals can be found in the care plan section) Acute Rehab OT Goals Patient Stated Goal: feel better OT Goal Formulation: With patient Time For Goal Achievement: 09/17/21 Potential to Achieve Goals: Fair  OT Frequency: Min 3X/week    Co-evaluation              AM-PAC OT "6 Clicks" Daily Activity     Outcome Measure Help from  another person eating meals?: A Little Help from another person taking care of personal grooming?: A Little Help from another person toileting, which includes using toliet, bedpan, or urinal?: A Lot Help from another person bathing (including washing, rinsing, drying)?: A Lot Help from another person to put on and taking off regular upper body clothing?: A Little Help from another person to put on and taking off regular lower body clothing?: A Lot 6 Click Score: 15   End of Session Equipment Utilized During Treatment: Gait belt Nurse Communication: Mobility status;Precautions  Activity Tolerance: Patient tolerated treatment well Patient left: in chair;with call bell/phone within reach;with chair alarm set  OT Visit Diagnosis: Other abnormalities of gait and mobility (R26.89);Other symptoms and signs involving cognitive function;Other symptoms and signs involving the nervous system (R29.898);Low vision, both eyes (H54.2)                Time: 5188-4166 OT Time Calculation (min): 31 min Charges:  OT General Charges $OT Visit: 1 Visit OT Evaluation $OT Eval Moderate Complexity: 1 Mod OT Treatments $Self Care/Home Management : 8-22 mins  Barry Brunner, OT Acute Rehabilitation Services Office [phone]   Chancy Milroy 09/03/2021, 1:46 PM

## 2021-09-03 NOTE — Evaluation (Signed)
Physical Therapy Evaluation Patient Details Name: Shawn Austin MRN: [id number] DOB: 1965-04-01 Today's Date: 09/03/2021  History of Present Illness  56 y.o. 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  Clinical Impression   Pt admitted secondary to problem above with deficits below. PTA patient was independent with all mobility. He lives in a two-story home with bedroom upstairs. He lives with girlfriend and son with girlfriend working 2 shifts per day. Pt currently requires moderate assist to stand due to significant lean to his right. He will require 2 person assist to attempt ambulation safely. Anticipate patient will benefit from PT to address problems listed below.Will continue to follow acutely to maximize functional mobility independence and safety.          Recommendations for follow up therapy are one component of a multi-disciplinary discharge planning process, led by the attending physician.  Recommendations may be updated based on patient status, additional functional criteria and insurance authorization.  Follow Up Recommendations Acute inpatient rehab (3hours/day)      Assistance Recommended at Discharge Frequent or constant Supervision/Assistance  Patient can return home with the following  Two people to help with walking and/or transfers;Assistance with cooking/housework;Direct supervision/assist for medications management;Direct supervision/assist for financial management;Assist for transportation;Help with stairs or ramp for entrance    Equipment Recommendations Rolling walker (2 wheels);Wheelchair (measurements PT);Wheelchair cushion (measurements PT)  Recommendations for Other Services  Rehab consult;OT consult    Functional  Status Assessment Patient has had a recent decline in their functional status and demonstrates the ability to make significant improvements in function in a reasonable and predictable amount of time.     Precautions / Restrictions Precautions Precautions: Fall      Mobility  Bed Mobility Overal bed mobility: Needs Assistance Bed Mobility: Rolling, Supine to Sit, Sit to Supine Rolling: Modified independent (Device/Increase time)   Supine to sit: Min guard Sit to supine: Supervision   General bed mobility comments: rolling for changing wet linen; impulsively trying to sit up several times while PT asking him to wait; minguard due to imbalance and impulsivity    Transfers Overall transfer level: Needs assistance Equipment used: Rolling walker (2 wheels) Transfers: Sit to/from Stand Sit to Stand: Mod assist           General transfer comment: vc for safe use of RW; assist for balance due to rt lean as coming to stand and in standing; repeated x 2 with no change    Ambulation/Gait               General Gait Details: unable to safely perform with +1 assist  Stairs            Wheelchair Mobility    Modified Rankin (Stroke Patients Only) Modified Rankin (Stroke Patients Only) Pre-Morbid Rankin Score: No symptoms Modified Rankin: Severe disability     Balance Overall balance assessment: Needs assistance Sitting-balance support: No upper extremity supported, Feet supported Sitting balance-Leahy Scale: Poor   Postural control: Right lateral lean Standing balance support: Bilateral upper extremity supported Standing balance-Leahy Scale: Poor                               Pertinent Vitals/Pain Pain Assessment Pain Assessment: No/denies pain    Home  Living Family/patient expects to be discharged to:: Private residence Living Arrangements: Spouse/significant other;Children Available Help at Discharge: Family;Available PRN/intermittently  (girlfriend works 2 shifts per day) Type of Home: House Home Access: Stairs to enter Entrance Stairs-Rails: Right Entrance Stairs-Number of Steps: 5 Alternate Level Stairs-Number of Steps: 20 Home Layout: Two level Home Equipment: None      Prior Function Prior Level of Function : Independent/Modified Independent             Mobility Comments: reports he was not working since 20 22       Hand Dominance   Dominant Hand: Right    Extremity/Trunk Assessment   Upper Extremity Assessment Upper Extremity Assessment: Defer to OT evaluation    Lower Extremity Assessment Lower Extremity Assessment: LLE deficits/detail LLE Deficits / Details: strength WFL; decr coordination/ataxia LLE Sensation:  (?decr proprioception; denies numbness) LLE Coordination: decreased gross motor;decreased fine motor    Cervical / Trunk Assessment Cervical / Trunk Assessment: Other exceptions Cervical / Trunk Exceptions: rt lateral shift in sitting and standing  Communication   Communication: Expressive difficulties (slurred)  Cognition Arousal/Alertness: Awake/alert Behavior During Therapy: Impulsive Overall Cognitive Status: Impaired/Different from baseline Area of Impairment: Attention, Following commands, Safety/judgement, Awareness, Problem solving                   Current Attention Level: Sustained   Following Commands: Follows one step commands with increased time Safety/Judgement: Decreased awareness of safety, Decreased awareness of deficits Awareness: Intellectual Problem Solving: Slow processing, Requires verbal cues, Requires tactile cues          General Comments      Exercises     Assessment/Plan    PT Assessment Patient needs continued PT services  PT Problem List Decreased balance;Decreased mobility;Decreased coordination;Decreased cognition;Decreased knowledge of use of DME;Decreased safety awareness;Decreased knowledge of precautions;Impaired  sensation       PT Treatment Interventions DME instruction;Gait training;Stair training;Functional mobility training;Therapeutic activities;Therapeutic exercise;Balance training;Neuromuscular re-education;Cognitive remediation;Patient/family education    PT Goals (Current goals can be found in the Care Plan section)  Acute Rehab PT Goals Patient Stated Goal: get better PT Goal Formulation: With patient Time For Goal Achievement: 09/17/21 Potential to Achieve Goals: Good    Frequency Min 4X/week     Co-evaluation               AM-PAC PT "6 Clicks" Mobility  Outcome Measure Help needed turning from your back to your side while in a flat bed without using bedrails?: None Help needed moving from lying on your back to sitting on the side of a flat bed without using bedrails?: A Little Help needed moving to and from a bed to a chair (including a wheelchair)?: Total Help needed standing up from a chair using your arms (e.g., wheelchair or bedside chair)?: A Lot Help needed to walk in hospital room?: Total Help needed climbing 3-5 steps with a railing? : Total 6 Click Score: 12    End of Session Equipment Utilized During Treatment: Gait belt Activity Tolerance: Patient tolerated treatment well Patient left: in bed;with call bell/phone within reach;with bed alarm set Nurse Communication: Mobility status PT Visit Diagnosis: Unsteadiness on feet (R26.81);Repeated falls (R29.6);Hemiplegia and hemiparesis Hemiplegia - Right/Left: Left Hemiplegia - dominant/non-dominant: Non-dominant Hemiplegia - caused by: Cerebral infarction    Time: 8250-5397 PT Time Calculation (min) (ACUTE ONLY): 20 min   Charges:   PT Evaluation $PT Eval Moderate Complexity: 1 Mod  Jerolyn Center, PT Acute Rehabilitation Services  Office [phone]   Zena Amos 09/03/2021, 8:33 AM

## 2021-09-04 LAB — BASIC METABOLIC PANEL
Anion gap: 13 (ref 5–15)
BUN: 8 mg/dL (ref 6–20)
CO2: 21 mmol/L — ABNORMAL LOW (ref 22–32)
Calcium: 9.4 mg/dL (ref 8.9–10.3)
Chloride: 107 mmol/L (ref 98–111)
Creatinine, Ser: 1.04 mg/dL (ref 0.61–1.24)
GFR, Estimated: >60 mL/min (ref >60)
Glucose, Bld: 150 mg/dL — ABNORMAL HIGH (ref 70–99)
Potassium: 4.6 mmol/L (ref 3.5–5.1)
Sodium: 141 mmol/L (ref 135–145)

## 2021-09-04 LAB — GLUCOSE, CAPILLARY
Glucose-Capillary: 169 mg/dL — ABNORMAL HIGH (ref 70–99)
Glucose-Capillary: 249 mg/dL — ABNORMAL HIGH (ref 70–99)
Glucose-Capillary: 222 mg/dL — ABNORMAL HIGH (ref 70–99)
Glucose-Capillary: 154 mg/dL — ABNORMAL HIGH (ref 70–99)

## 2021-09-04 MED ORDER — HYDROCHLOROTHIAZIDE 25 MG PO TABS
25.0000 mg | ORAL_TABLET | Freq: Every day | ORAL | Status: DC
Start: 2021-09-04 — End: 2021-09-11
  Administered 2021-09-04 – 2021-09-11 (×8): 25 mg via ORAL
  Filled 2021-09-04 (×8): qty 1

## 2021-09-04 MED ORDER — METFORMIN HCL 500 MG PO TABS
500.0000 mg | ORAL_TABLET | Freq: Two times a day (BID) | ORAL | Status: DC
  Administered 2021-09-04 – 2021-09-11 (×14): 500 mg via ORAL
  Filled 2021-09-04 (×14): qty 1

## 2021-09-04 NOTE — TOC Progression Note (Signed)
Transition of Care [facility]) - Progression Note    Patient Details  Name: Shawn Austin MRN: [id number] Date of Birth: [birth year]  [facility] CM/SW Contact  Baldemar Lenis, [place] Phone Number: 09/04/2021, 3:19 PM  Clinical Narrative:   CSW alerted by Rehab Admissions liaison that they will not pursue CIR for patient. However, patient has no other rehab venue options as he has no insurance coverage. CSW sent message to PT to check on possibility of patient entering into [facility] program to rehab in preparation for returning home at discharge. CSW to follow.    Expected Discharge Plan: IP Rehab Facility Barriers to Discharge: Continued Medical Work up, Inadequate or no insurance  Expected Discharge Plan and Services Expected Discharge Plan: IP Rehab Facility   Discharge Planning Services: CM Consult Post Acute Care Choice: IP Rehab Living arrangements for the past 2 months: Single Family Home                                       Social Determinants of Health (SDOH) Interventions    Readmission Risk Interventions     No data to display

## 2021-09-04 NOTE — Progress Notes (Signed)
Physical Therapy Treatment Patient Details Name: Shawn Austin MRN: [id number] DOB: 1965/10/03 Today's Date: 09/04/2021   History of Present Illness 56 y.o. 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    PT Comments    Pt slowly progressing toward goals, hindered by skewed sense of midline and having difficulty w/shifting L and un-weighting the R LE for pre-gait .  Emphasis on transitions to EOB, sitting balance/midline orientation, standing trials with work on symmetrical sit to stands and pre-gait work on w/shifting, un-weighting and steps.  Finally, pt transferred to the recliner.  Gait was deferred due to not yet safe.   Recommendations for follow up therapy are one component of a multi-disciplinary discharge planning process, led by the attending physician.  Recommendations may be updated based on patient status, additional functional criteria and insurance authorization.  Follow Up Recommendations  Acute inpatient rehab (3hours/day)     Assistance Recommended at Discharge Frequent or constant Supervision/Assistance  Patient can return home with the following Two people to help with walking and/or transfers;Assistance with cooking/housework;Direct supervision/assist for medications management;Direct supervision/assist for financial management;Assist for transportation;Help with stairs or ramp for entrance   Equipment Recommendations  Rolling walker (2 wheels);Wheelchair (measurements PT);Wheelchair cushion (measurements PT)    Recommendations for Other Services Rehab consult;OT consult     Precautions / Restrictions Precautions Precautions: Fall     Mobility  Bed Mobility Overal bed mobility: Needs Assistance Bed Mobility: Supine to  Sit     Supine to sit: Min guard     General bed mobility comments: increase time and effort    Transfers Overall transfer level: Needs assistance Equipment used: None, Rolling walker (2 wheels) Transfers: Sit to/from Stand, Bed to chair/wheelchair/BSC Sit to Stand: Mod assist, +2 physical assistance, +2 safety/equipment     Squat pivot transfers: Mod assist, +2 safety/equipment     General transfer comment: cues for hand placement and midline orientation prior to standing, assist forward and with boost.    Ambulation/Gait               General Gait Details: unable to safely perform today with 2 persons   Stairs             Wheelchair Mobility    Modified Rankin (Stroke Patients Only) Modified Rankin (Stroke Patients Only) Pre-Morbid Rankin Score: No symptoms Modified Rankin: Severe disability     Balance Overall balance assessment: Needs assistance Sitting-balance support: No upper extremity supported, Feet supported, Single extremity supported, Bilateral upper extremity supported Sitting balance-Leahy Scale: Poor Sitting balance - Comments: reliant on UE support or external assist Postural control: Right lateral lean Standing balance support: Bilateral upper extremity supported, During functional activity Standing balance-Leahy Scale: Poor Standing balance comment: relies on BUE and external support.  Stood at EOB x3 for 2-4 min each working on coming into midline and w/shifts with unweighting in order to takes steps forward/back.  2 trials with HHA and 1 trial with RW                            Cognition Arousal/Alertness: Lethargic, Awake/alert Behavior During Therapy: Impulsive Overall Cognitive Status: Impaired/Different from baseline  Current Attention Level: Sustained Memory: Decreased short-term memory Following Commands: Follows one step commands with increased time Safety/Judgement: Decreased  awareness of safety, Decreased awareness of deficits Awareness: Emergent Problem Solving: Difficulty sequencing, Requires verbal cues, Requires tactile cues General Comments: As in  last session, patient aware of R lateral lean but unable to correct without mulitmodal cueing, poor carryover of techniques and decreased awareness of safety during session.        Exercises      General Comments        Pertinent Vitals/Pain Pain Assessment Pain Assessment: Faces Faces Pain Scale: No hurt Pain Intervention(s): Monitored during session    Home Living                          Prior Function            PT Goals (current goals can now be found in the care plan section) Acute Rehab PT Goals Patient Stated Goal: get better PT Goal Formulation: With patient Time For Goal Achievement: 09/17/21 Potential to Achieve Goals: Good Progress towards PT goals: Progressing toward goals    Frequency    Min 4X/week      PT Plan Current plan remains appropriate    Co-evaluation              AM-PAC PT "6 Clicks" Mobility   Outcome Measure  Help needed turning from your back to your side while in a flat bed without using bedrails?: None Help needed moving from lying on your back to sitting on the side of a flat bed without using bedrails?: A Little Help needed moving to and from a bed to a chair (including a wheelchair)?: Total Help needed standing up from a chair using your arms (e.g., wheelchair or bedside chair)?: A Lot Help needed to walk in hospital room?: Total Help needed climbing 3-5 steps with a railing? : Total 6 Click Score: 12    End of Session   Activity Tolerance: Patient tolerated treatment well Patient left: in chair;with call bell/phone within reach;with chair alarm set Nurse Communication: Mobility status PT Visit Diagnosis: Unsteadiness on feet (R26.81);Other abnormalities of gait and mobility (R26.89);Hemiplegia and hemiparesis Hemiplegia -  Right/Left: Left Hemiplegia - dominant/non-dominant: Non-dominant Hemiplegia - caused by: Cerebral infarction     Time: 1356-1436 PT Time Calculation (min) (ACUTE ONLY): 40 min  Charges:  $Therapeutic Activity: 23-37 mins $Neuromuscular Re-education: 8-22 mins                     09/04/2021  Jacinto Halim., PT Acute Rehabilitation Services [phone]  (pager) [phone]  (office)   Eliseo Gum Akacia Boltz 09/04/2021, 3:02 PM

## 2021-09-04 NOTE — Progress Notes (Signed)
Progress Note   Patient: Shawn Austin LPF:[id number] DOB: Dec 27, 1965 DOA: 09/01/2021     2 DOS: the patient was seen and examined on 09/04/2021   Brief hospital course: Shawn Austin was admitted with the working diagnosis of acute CVA.   56 y.o. male with medical history significant for type 2 diabetes mellitus, hypertension, hyperlipidemia who is admitted to [facility] on 09/01/2021 with acute ischemic stroke after presenting from home to [facility] ED complaining of disequilibrium.  Patient had acute onset of disequilibrium and slurred speech noticed after waking up. His symptoms were persistent prompting him to come to the ED. On his initial physical examination his blood pressure was 182/111, HR 89, RR 24 and 02 saturation 100%,lungs with no wheezing or rhonchi, heart with S1 and S2 present and rhythmic with no gallops, rubs or murmurs, abdomen not distended and no lower extremity edema, strength was preserved.   Na 138, K 3,7 Cl 108, bicarbonate 26 glucose 150, bun 8 cr 0,86  LDL 169 cholesterol 232  Wbc 5.5 hgb 13.5 plt 390  Sars covid 19 negative  Urine analysis with SG >1,046 protein 300   Head ct negative for acute changes.   EKG 84 bpm, normal axis, qtc 508, right bundle branch block with sinus rhythm, no significant ST segment or T wave changes.   MRI showed acute ischemia at the dorsal lateral right medulla oblongata.    Neurology has recommended aspirin and clopidogrel for 3 weeks and the continue with aspirin alone.   No beds at the CIR and patient unfortunately has no insurance in order to be transferred to SNF. Plan to discharge home when he becomes more mobile.   Assessment and Plan: * Acute ischemic stroke [facility]) MRI with small focus of acute ischemia at dorsal lateral right medulla oblongata  CTA head/neck with variant anteriorcerebral anatoma, diminutive L anterior cerebral artery (concern possibly acutely occluded).  Intrcranial atherosclerotic disease  diffusely affecting the anterior, middle, and posterior cerebral artery vessels.  Moderate stenosis of distal M1 segment on L. Atherosclerotic disease in both carotid siphon regions with stenosis approaching 50% particularly on the R  Echocardiogram with preserved LV systolic function.   Patient with improvement in neurologic deficit.  Plan to continue outpatient PT and OT Dual antiplatelet therapy with aspirin and clopidogrel, then continue with aspirin alone.   Unfortunately not able to go to CIR or SNF, will have to be more mobile before can be discharge home.  Social services to assist in home health services.  Ok to discontinue telemetry.    Essential hypertension, benign Blood pressure has been elevated, permissive hypertension.  Blood pressure continue to be elevated 193/108 and 178/104   Continue with  lisinopril and will add diuretic therapy with HCTZ.  Continue close blood pressure monitoring.   Type 2 diabetes mellitus with hyperlipidemia (HCC) Fasting glucose this am 150, capillary glucose with hyperglycemia 249, 169 and 218. Will resume metformin and will continue with insulin sliding scale for glucose cover and monitoring.  At home patient on 10 mg of glipizide daily.    Continue with statin therapy.   Anemia Chronic anemia, hgb has been stable 13 to 12. Follow up as outpatient.         Subjective: Patient with improvement in strength on his right upper extremity, still has not been able to get out of bed to the restroom by himself.   Physical Exam: Vitals:   09/04/21 0340 09/04/21 0521 09/04/21 0830 09/04/21 1202  BP: (!) 196/143 (!) 172/95 (!) 193/108 (!) 178/104  Pulse: (!) 101 86 94 100  Resp: 17 11 15 14   Temp: 98.4 F (36.9 C)  98.3 F (36.8 C) 97.7 F (36.5 C)  TempSrc: Oral  Oral Axillary  SpO2: 98% 99% 100% 99%  Weight:      Height:       Neurology awake and alert, decreased strength 3/5 right upper extremity ENT with mild  pallor Cardiovascular with S1 and S2 present and rhythmic Respiratory with no rales or wheezing Abdomen not distended No lower extremity edema  Data Reviewed:    Family Communication: no family at the bedside   Disposition: Status is: Inpatient Remains inpatient appropriate because: pending placement   Planned Discharge Destination: Home      Author: , MD 09/04/2021 1:55 PM  For on call review [email or web address].

## 2021-09-04 NOTE — Progress Notes (Signed)
Speech Language Pathology Treatment: Dysphagia;Cognitive-Linquistic  Patient Details Name: Shawn Austin MRN: [id number] DOB: 05-31-1965 Today's Date: 09/04/2021 Time: 4562-5638 SLP Time Calculation (min) (ACUTE ONLY): 18 min  Assessment / Plan / Recommendation Clinical Impression  Pt was seen for treatment. He was alert and cooperative during the session, but expressed some disinterest in treatment. Pt reported that his cognition is improved compared to that noted during the initial evaluation and that he "was off" that day. He completed a mental manipulation task with 60% accuracy increasing to 100% with verbal prompts. Pt was able to attend to tasks with intermittent prompts and repetition. Reduced articulatory precision was observed, but pt expressed that his speech is ~95% back to baseline and that he is not interested in dysarthria intervention. Eye contact was limited during the session with frequent deviation to the right; OT's vision assessment is ongoing. He tolerated regular texture solids, thin liquids and purees without overt s/s of aspiration. Mastication and oral clearance were functional. Pt's diet will be advanced to regular texture solids and thin liquids. The session was terminated prematurely due to pt's report of fatigue. SLP will continue to follow pt.    HPI HPI: Pt is a 56 yo male presenting with new onset slurred speech and disequilibrium. MRI showed acute ischemia at the R dorsal lateral R medulla  oblongata. Pt did initially pass a Yale swallow screen, but RN noted some concerns for dysphagia that she describes as slow swallowing. PMH also includes: mild cognitive disorder, stroke, DMII, HTN, HLD      SLP Plan  Continue with current plan of care      Recommendations for follow up therapy are one component of a multi-disciplinary discharge planning process, led by the attending physician.  Recommendations may be updated based on patient status, additional functional  criteria and insurance authorization.    Recommendations  Diet recommendations: Dysphagia 3 (mechanical soft);Thin liquid Liquids provided via: Cup;Straw Medication Administration: Whole meds with liquid                Follow Up Recommendations: Acute inpatient rehab (3hours/day) Assistance recommended at discharge: Frequent or constant Supervision/Assistance SLP Visit Diagnosis: Cognitive communication deficit (R41.841);Dysarthria and anarthria (R47.1) Plan: Continue with current plan of care          Shawn Austin I. Vear Clock, MS, CCC-SLP Acute Rehabilitation Services Office number [phone] Pager [phone]  Shawn Austin  09/04/2021, 10:49 AM

## 2021-09-04 NOTE — Progress Notes (Addendum)
Inpatient Rehabilitation Admissions Coordinator   I currently have no bed at CIR to pursue for possible admit. I have alerted acute team and MD that other rehab venues need to be pursued. We will sign off.  Ottie Glazier, RN, MSN Rehab Admissions Coordinator [phone] 09/04/2021 11:57 AM

## 2021-09-05 LAB — GLUCOSE, CAPILLARY
Glucose-Capillary: 176 mg/dL — ABNORMAL HIGH (ref 70–99)
Glucose-Capillary: 185 mg/dL — ABNORMAL HIGH (ref 70–99)
Glucose-Capillary: 219 mg/dL — ABNORMAL HIGH (ref 70–99)
Glucose-Capillary: 261 mg/dL — ABNORMAL HIGH (ref 70–99)

## 2021-09-05 NOTE — Progress Notes (Signed)
Physical Therapy Treatment Patient Details Name: Shawn Austin MRN: [id number] DOB: 1965-03-10 Today's Date: 09/05/2021   History of Present Illness 56 y.o. 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    PT Comments    Pt continues to demonstrate significant L sided inattention, poor motor planning and motor control and continues to require heavy physical assistance of two people for functional mobility. Pt would continue to benefit from skilled physical therapy services at this time while admitted and after d/c to address the below listed limitations in order to improve overall safety and independence with functional mobility.    Recommendations for follow up therapy are one component of a multi-disciplinary discharge planning process, led by the attending physician.  Recommendations may be updated based on patient status, additional functional criteria and insurance authorization.  Follow Up Recommendations  Acute inpatient rehab (3hours/day)     Assistance Recommended at Discharge Frequent or constant Supervision/Assistance  Patient can return home with the following Two people to help with walking and/or transfers;Assistance with cooking/housework;Direct supervision/assist for medications management;Direct supervision/assist for financial management;Assist for transportation;Help with stairs or ramp for entrance   Equipment Recommendations  Other (comment) (defer to next venue of care)    Recommendations for Other Services       Precautions / Restrictions Precautions Precautions: Fall Restrictions Weight Bearing Restrictions: No     Mobility  Bed Mobility Overal bed mobility: Needs Assistance Bed Mobility: Supine to Sit      Supine to sit: Min guard     General bed mobility comments: pt able to achieve an upright sitting position at EOB with HOB elevated, use of bed rails, no physical assist needed    Transfers Overall transfer level: Needs assistance Equipment used: Rolling walker (2 wheels), None Transfers: Sit to/from Stand, Bed to chair/wheelchair/BSC Sit to Stand: Mod assist, +2 physical assistance Stand pivot transfers: Max assist, +2 physical assistance   Squat pivot transfers: Mod assist, Min assist, +2 physical assistance     General transfer comment: cueing for safe hand placement and technique; pt able to complete sit<>stand x2 from EOB with mod A x2 for stability as pt with consistent heavy R lateral lean; pt completed a stand-pivot transfer from the bed to the recliner chair towards his right side with max A x2 and maximal multimodal cueing to complete. Despite max cueing, pt had great difficulties with sequencing the stand-pivot transfer, unable to move L LE appropriately and with poor motor control and coordination of R LE. He also demonstrated a tendency to lift the RW off of the floor and required max A for safety with use. He then practiced squat-pivot transfers with improved safety and requiring less physical assist; mod A x2 to transfer to the R and min A x2 to transfer to the L    Ambulation/Gait               General Gait Details: unable to safely attempt at this time   Stairs             Wheelchair Mobility    Modified Rankin (Stroke Patients Only) Modified Rankin (Stroke Patients Only) Pre-Morbid Rankin Score: No symptoms Modified Rankin: Severe disability     Balance Overall balance assessment: Needs assistance Sitting-balance support: Feet supported, Single extremity supported, Bilateral upper extremity  supported Sitting balance-Leahy Scale: Poor     Standing balance support: Bilateral upper extremity supported, During functional activity Standing  balance-Leahy Scale: Zero Standing balance comment: heavy R lateral lean/push; reliant on two person external physical assistance                            Cognition Arousal/Alertness: Awake/alert Behavior During Therapy: Impulsive Overall Cognitive Status: Impaired/Different from baseline Area of Impairment: Attention, Following commands, Safety/judgement, Awareness, Problem solving, Memory                   Current Attention Level: Sustained Memory: Decreased short-term memory Following Commands: Follows one step commands with increased time Safety/Judgement: Decreased awareness of safety, Decreased awareness of deficits Awareness: Intellectual Problem Solving: Slow processing, Difficulty sequencing, Requires verbal cues, Requires tactile cues          Exercises      General Comments        Pertinent Vitals/Pain Pain Assessment Pain Assessment: No/denies pain    Home Living                          Prior Function            PT Goals (current goals can now be found in the care plan section) Acute Rehab PT Goals PT Goal Formulation: With patient Time For Goal Achievement: 09/17/21 Potential to Achieve Goals: Good Progress towards PT goals: Progressing toward goals    Frequency    Min 4X/week      PT Plan Current plan remains appropriate    Co-evaluation PT/OT/SLP Co-Evaluation/Treatment: Yes Reason for Co-Treatment: For patient/therapist safety;To address functional/ADL transfers PT goals addressed during session: Mobility/safety with mobility;Balance;Proper use of DME;Strengthening/ROM OT goals addressed during session: ADL's and self-care      AM-PAC PT "6 Clicks" Mobility   Outcome Measure  Help needed turning from your back to your side while in a flat bed without using bedrails?: None Help needed moving from lying on your back to sitting on the side of a flat bed without using bedrails?: A Little Help needed moving  to and from a bed to a chair (including a wheelchair)?: A Lot Help needed standing up from a chair using your arms (e.g., wheelchair or bedside chair)?: A Lot Help needed to walk in hospital room?: Total Help needed climbing 3-5 steps with a railing? : Total 6 Click Score: 13    End of Session Equipment Utilized During Treatment: Gait belt Activity Tolerance: Patient tolerated treatment well Patient left: in chair;with call bell/phone within reach;with chair alarm set Nurse Communication: Mobility status PT Visit Diagnosis: Unsteadiness on feet (R26.81);Other abnormalities of gait and mobility (R26.89);Hemiplegia and hemiparesis Hemiplegia - Right/Left: Left Hemiplegia - dominant/non-dominant: Non-dominant Hemiplegia - caused by: Cerebral infarction     Time: 0939-1006 PT Time Calculation (min) (ACUTE ONLY): 27 min  Charges:  $Therapeutic Activity: 8-22 mins                     Arletta Bale, DPT  Acute Rehabilitation Services Office [phone]    Alessandra Bevels Brice Kossman 09/05/2021, 10:59 AM

## 2021-09-05 NOTE — Progress Notes (Signed)
Occupational Therapy Treatment Patient Details Name: Shawn Austin MRN: [id number] DOB: August 30, 1965 Today's Date: 09/05/2021   History of present illness 56 y.o. male with medical history significant for type 2 diabetes mellitus, hypertension, hyperlipidemia who is admitted to [facility] on 09/01/2021 with acute ischemic stroke (right dorsal lateral right medulla oblongata) after presenting from home to [facility] ED complaining of disequilibrium.   OT comments  Pt with incremental progress towards established OT goals. Pt continues decreased balance, coordination, problem solving, awareness, ability to follow commands, and significant L inattention. Pt consistently requiring verbal cues for attention to L side of body and environment. Pt requiring max A +2 and max verbal cues for bed<>chair stand pivor transfer. Pt requiring mod A+2 for squat pivot to BSC to R and min A +2 for squat pivot to L. Pt eating during session, requiring min-mod verbal cues for scanning; noting decreased problem solving and poor coordination of bilateral tasks (opening containers/packages). Due to patient's motivation for independence and significant change in functional status, recommend discharge to AIR to optimize independence in ADL and IADL. Will continue to follow acutely as admitted.    Recommendations for follow up therapy are one component of a multi-disciplinary discharge planning process, led by the attending physician.  Recommendations may be updated based on patient status, additional functional criteria and insurance authorization.    Follow Up Recommendations  Acute inpatient rehab (3hours/day)    Assistance Recommended at Discharge Frequent or constant Supervision/Assistance  Patient can return home with the following  Two people to help with walking and/or transfers;A lot of help with bathing/dressing/bathroom;Assistance with cooking/housework;Direct supervision/assist for medications management;Direct  supervision/assist for financial management;Assist for transportation;Help with stairs or ramp for entrance   Equipment Recommendations  Other (comment) (defer)    Recommendations for Other Services Speech consult    Precautions / Restrictions Precautions Precautions: Fall Restrictions Weight Bearing Restrictions: No       Mobility Bed Mobility Overal bed mobility: Needs Assistance Bed Mobility: Supine to Sit     Supine to sit: Min guard     General bed mobility comments: pt able to achieve an upright sitting position at EOB with HOB elevated, use of bed rails, no physical assist needed    Transfers Overall transfer level: Needs assistance Equipment used: Rolling walker (2 wheels), None Transfers: Sit to/from Stand, Bed to chair/wheelchair/BSC Sit to Stand: Mod assist, +2 physical assistance Stand pivot transfers: Max assist, +2 physical assistance Squat pivot transfers: Mod assist, Min assist, +2 physical assistance       General transfer comment: cueing for safe hand placement and technique; pt able to complete sit<>stand x2 from EOB with mod A x2 for stability as pt with consistent heavy R lateral lean; pt completed a stand-pivot transfer from the bed to the recliner chair towards his right side with max A x2 and maximal multimodal cueing to complete due to difficulty following commands/sequencing/taking steps. Despite max cueing, pt had great difficulties with sequencing the stand-pivot transfer, unable to move L LE appropriately and with poor motor control and coordination of R LE. He also demonstrated a tendency to lift the RW off of the floor despite verbal cues to avoid lifting; and required max A for safety with use. He then practiced squat-pivot transfers with improved safety and requiring less physical assist; mod A x2 to transfer to the R and min A x2 to transfer to the L     Balance Overall balance assessment: Needs assistance Sitting-balance support: Feet  supported, Single extremity supported, Bilateral upper extremity supported Sitting balance-Leahy Scale: Poor Sitting balance - Comments: Pt bracing self with RUE sitting EOB. Reliant on UE support or external assist Postural control: Right lateral lean Standing balance support: Bilateral upper extremity supported, During functional activity Standing balance-Leahy Scale: Zero Standing balance comment: heavy R lateral lean/push; reliant on two person external physical assistance                           ADL either performed or assessed with clinical judgement   ADL Overall ADL's : Needs assistance/impaired Eating/Feeding: Sitting;Minimal assistance Eating/Feeding Details (indicate cue type and reason): Pt requiring min verbal cues to scan tray to locate items on food tray, and for problem solving to locate items.             Upper Body Dressing : Minimal assistance;Sitting Upper Body Dressing Details (indicate cue type and reason): Min A to don new gown     Toilet Transfer: Moderate assistance;Squat-pivot;+2 for physical assistance;BSC/3in1 Toilet Transfer Details (indicate cue type and reason): Transfer to [facility] toward R side with Mod A+2 for safety/physical assist. Min A for transfer toward L returning to recliner. Toileting- Clothing Manipulation and Hygiene: Total assistance;Sit to/from stand;+2 for physical assistance;+2 for safety/equipment Toileting - Clothing Manipulation Details (indicate cue type and reason): Pt requiring total A for posterior pericare due to requiring mod +2 support for standing balance. Pt unaware of bowel movement in bed.     Functional mobility during ADLs: Moderate assistance;+2 for physical assistance;+2 for safety/equipment General ADL Comments: limited by balance, coordination, vision and cognition.    Extremity/Trunk Assessment Upper Extremity Assessment Upper Extremity Assessment: RUE deficits/detail;LUE deficits/detail RUE Deficits /  Details: strength WFL, decreased GMC with finger to nose testing RUE Sensation: WNL RUE Coordination: decreased gross motor LUE Deficits / Details: strength WFL, decreased fine and gross motor coordination with testing LUE Sensation: WNL LUE Coordination: decreased gross motor;decreased fine motor   Lower Extremity Assessment Lower Extremity Assessment: Defer to PT evaluation        Vision   Vision Assessment?: Yes Eye Alignment: Impaired (comment) (eyes shifted to R side of midline) Ocular Range of Motion: Restricted on the left (Able to scan L with increased time and effort) Alignment/Gaze Preference: Gaze right;Head tilt Tracking/Visual Pursuits: Impaired - to be further tested in functional context (increased time to scan to left) Diplopia Assessment:  (Pt denying any double vision) Additional Comments: Pt reports blurry vision in L eye, however, reports that he had gotten glasses for blurry vision in december. glasses not present. Pt with continued R gaze and using max compensatory techniques (head turn) to scan environment. Can perform ocular movement to L with increased time and effort. Pt performing visual scanning to L side 10+ times throughout session durig eating, transfers, reading the clock, and looking at therapist. Pt requiring increased time each attempt. Pt requires verbal cues to scan to left to locate items in environment.   Perception Perception Perception: Impaired (Visual perception difficulty and decreased attention to L side of environment. Decreased use of L side of body)   Praxis Praxis Praxis: Not tested    Cognition Arousal/Alertness: Awake/alert Behavior During Therapy: Impulsive Overall Cognitive Status: Impaired/Different from baseline Area of Impairment: Attention, Following commands, Safety/judgement, Awareness, Problem solving, Memory                   Current Attention Level: Sustained Memory: Decreased short-term memory Following  Commands: Follows one step commands with increased time Safety/Judgement: Decreased awareness of safety, Decreased awareness of deficits Awareness: Intellectual Problem Solving: Slow processing, Difficulty sequencing, Requires verbal cues, Requires tactile cues General Comments: Pt with decreased awareness of current level of function, reporting "when do you want me to get up and walk to the bathroom", and "I am gonna do it, I don't need help". Pt with decreased attention, as indicated by difficulty sustaning attention on task. Pt following one step commands with increased time; requiring step by step commands for safety and seqencing, particularly to perform safe transfers. Pt with decreased attention to R side of body and environment throughout session.        Exercises      Shoulder Instructions       General Comments VSS    Pertinent Vitals/ Pain       Pain Assessment Pain Assessment: Faces Faces Pain Scale: No hurt Pain Intervention(s): Monitored during session  Home Living                                          Prior Functioning/Environment              Frequency  Min 3X/week        Progress Toward Goals  OT Goals(current goals can now be found in the care plan section)  Progress towards OT goals: Progressing toward goals  Acute Rehab OT Goals Patient Stated Goal: walk to the bathroom from the bed OT Goal Formulation: With patient Time For Goal Achievement: 09/17/21 Potential to Achieve Goals: Fair ADL Goals Pt Will Perform Grooming: with min guard assist;standing Pt Will Perform Upper Body Bathing: sitting;with supervision Pt Will Perform Lower Body Dressing: with min guard assist;sitting/lateral leans;sit to/from stand;with adaptive equipment Pt Will Transfer to Toilet: with supervision;stand pivot transfer;ambulating Pt Will Perform Toileting - Clothing Manipulation and hygiene: with supervision;sitting/lateral leans Pt/caregiver  will Perform Home Exercise Program: With written HEP provided;Both right and left upper extremity;With theraband;With theraputty Additional ADL Goal #1: Pt will scan towards L side to locate 3/5 items during ADL task without cueing.  Plan Discharge plan remains appropriate    Co-evaluation    PT/OT/SLP Co-Evaluation/Treatment: Yes Reason for Co-Treatment: For patient/therapist safety;To address functional/ADL transfers PT goals addressed during session: Mobility/safety with mobility;Balance;Proper use of DME;Strengthening/ROM OT goals addressed during session: ADL's and self-care      AM-PAC OT "6 Clicks" Daily Activity     Outcome Measure   Help from another person eating meals?: A Little Help from another person taking care of personal grooming?: A Little Help from another person toileting, which includes using toliet, bedpan, or urinal?: A Lot Help from another person bathing (including washing, rinsing, drying)?: A Lot Help from another person to put on and taking off regular upper body clothing?: A Little Help from another person to put on and taking off regular lower body clothing?: A Lot 6 Click Score: 15    End of Session Equipment Utilized During Treatment: Gait belt;Rolling walker (2 wheels)  OT Visit Diagnosis: Other abnormalities of gait and mobility (R26.89);Other symptoms and signs involving cognitive function;Other symptoms and signs involving the nervous system (R29.898);Low vision, both eyes (H54.2)   Activity Tolerance Patient tolerated treatment well   Patient Left in chair;with call bell/phone within reach;with chair alarm set   Nurse Communication Mobility status;Precautions  Time: 3009-2330 OT Time Calculation (min): 28 min  Charges: OT General Charges $OT Visit: 1 Visit OT Treatments $Self Care/Home Management : 8-22 mins  Ladene Artist, OTR/L [facility] Acute Rehabilitation Office: [phone]   Drue Novel 09/05/2021, 12:00 PM

## 2021-09-05 NOTE — Progress Notes (Signed)
PROGRESS NOTE  Shawn Austin  DOB: Nov 11, 1965  PCP: Judee Clara, FNP [id number]  DOA: 09/01/2021  LOS: 3 days  Hospital Day: 5  Brief narrative: Shawn Austin is a 56 y.o. male with PMH significant for DM2, HTN, HLD 09/01/2021, patient presented to the ED from home with complaint of loss of balance, slurred speech after waking up. In the ED he was noted to have blood pressure elevated to 182/111 CT head negative for acute changes MRI brain showed acute ischemia at the dorsal lateral right medulla oblongata. CTA head/neck with variant anteriorcerebral anatomy, diminutive L anterior cerebral artery (concern possibly acutely occluded).  Intrcranial atherosclerotic disease diffusely affecting the anterior, middle, and posterior cerebral artery vessels.  Moderate stenosis of distal M1 segment on L. Atherosclerotic disease in both carotid siphon regions with stenosis approaching 50% particularly on the R Admitted to hospitalist service Neurology consulted.  Stroke work-up completed. Pending SNF at this time  Subjective: Patient was seen and examined this afternoon.  Middle-aged African-American male.  Not in distress. 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   -Imaging findings as above  -Neurology consult appreciated. -Recommended DAPT with aspirin and Plavix for 3 weeks followed by aspirin alone. -Initially recommended for CIR but unfortunately not able to go to CIR or SNF because of lack of insurance -TOC consulted for home with services.  Type 2 diabetes mellitus -A1c 8.1 on 09/02/2021 -Was supposed to be on but not taking oral antidiabetic medications at home -Currently on metformin 500 mg twice daily.  Continue to monitor.  We will add glipizide  tomorrow if blood sugar remains elevated Recent Labs  Lab 09/04/21 1743 09/04/21 2325 09/05/21 0510 09/05/21 1147 09/05/21 1622  GLUCAP 222* 154* 176* 185* 219*   Essential hypertension -Blood pressure continues to remain elevated to 180s in last 24 hours  -Currently on lisinopril 10 mg daily.  HCTZ 25 mg daily was added in last 24 hours.  Continue to monitor blood pressure.  May need further adjustment.  Hyperlipidemia -Continue with statin therapy.    Goals of care   Code Status: Full Code    Mobility: PT eval obtained  Skin assessment:     Nutritional status:  Body mass index is 26.73 kg/m.          Diet:  Diet Order             Diet regular Room service appropriate? Yes with Assist; Fluid consistency: Thin  Diet effective now                   DVT prophylaxis:  SCDs Start: 09/02/21 0210   Antimicrobials: None Fluid: None Consultants: None Family Communication: None at bedside  Status is: Inpatient  Continue in-hospital care because: Pending placement Level of care: Med-Surg   Dispo: The patient is from: Home              Anticipated d/c is to: Unclear discharge plan              Patient currently is medically stable to d/c.   Difficult to place patient No     Infusions:    Scheduled Meds:  aspirin  81 mg Oral Daily   atorvastatin  80 mg Oral Daily   clopidogrel  75 mg Oral Daily  hydrochlorothiazide  25 mg Oral Daily   insulin aspart  0-6 Units Subcutaneous Q6H   lisinopril  10 mg Oral Daily   metFORMIN  500 mg Oral BID WC    PRN meds: acetaminophen **OR** acetaminophen, labetalol, LORazepam, mouth rinse   Antimicrobials: Anti-infectives (From admission, onward)    None       Objective: Vitals:   09/05/21 1132 09/05/21 1602  BP: (!) 157/100 (!) 181/114  Pulse: 92 94  Resp: 18 19  Temp: 98.7 F (37.1 C) 98.1 F (36.7 C)  SpO2: 99% 100%    Intake/Output Summary (Last 24 hours) at 09/05/2021 1701 Last data  filed at 09/05/2021 1638 Gross per 24 hour  Intake 240 ml  Output 3050 ml  Net -2810 ml   Filed Weights   09/01/21 1655 09/02/21 2242 09/05/21 0407  Weight: 86.2 kg 83.2 kg 82.1 kg   Weight change:  Body mass index is 26.73 kg/m.   Physical Exam: General exam: Pleasant middle-aged African-American male Skin: No rashes, lesions or ulcers. HEENT: Atraumatic, normocephalic, no obvious bleeding Lungs: Clear to auscultation bilaterally CVS: Regular rate and rhythm, no murmur GI/Abd soft, nontender, nondistended, bowel sound present CNS: Alert, awake, oriented x3 Psychiatry: Sad affect Extremities: No pedal edema, no calf tenderness  Data Review: I have personally reviewed the laboratory data and studies available.  F/u labs ordered Unresulted Labs (From admission, onward)    None       Signed, [name], MD [facility] 09/05/2021

## 2021-09-06 LAB — GLUCOSE, CAPILLARY
Glucose-Capillary: 115 mg/dL — ABNORMAL HIGH (ref 70–99)
Glucose-Capillary: 118 mg/dL — ABNORMAL HIGH (ref 70–99)
Glucose-Capillary: 169 mg/dL — ABNORMAL HIGH (ref 70–99)
Glucose-Capillary: 287 mg/dL — ABNORMAL HIGH (ref 70–99)

## 2021-09-06 MED ORDER — LOSARTAN POTASSIUM 50 MG PO TABS
50.0000 mg | ORAL_TABLET | Freq: Every day | ORAL | Status: DC
  Administered 2021-09-06 – 2021-09-08 (×3): 50 mg via ORAL
  Filled 2021-09-06 (×3): qty 1

## 2021-09-06 MED ORDER — ONDANSETRON HCL 4 MG/2ML IJ SOLN
4.0000 mg | Freq: Four times a day (QID) | INTRAMUSCULAR | Status: DC | PRN
  Administered 2021-09-06 – 2021-09-07 (×3): 4 mg via INTRAVENOUS
  Filled 2021-09-06 (×3): qty 2

## 2021-09-06 MED ORDER — GLIPIZIDE 5 MG PO TABS
2.5000 mg | ORAL_TABLET | Freq: Every day | ORAL | Status: DC
  Administered 2021-09-06 – 2021-09-10 (×5): 2.5 mg via ORAL
  Filled 2021-09-06 (×6): qty 1

## 2021-09-06 MED ORDER — CARVEDILOL 3.125 MG PO TABS
3.1250 mg | ORAL_TABLET | Freq: Two times a day (BID) | ORAL | Status: DC
  Administered 2021-09-06 – 2021-09-11 (×11): 3.125 mg via ORAL
  Filled 2021-09-06 (×11): qty 1

## 2021-09-06 NOTE — Progress Notes (Signed)
PROGRESS NOTE  Shawn Austin  DOB: 25-Oct-1965  PCP: Judee Clara, FNP [id number]  DOA: 09/01/2021  LOS: 4 days  Hospital Day: 6  Brief narrative: Shawn Austin is a 57 y.o. male with PMH significant for DM2, HTN, HLD 09/01/2021, patient presented to the ED from home with complaint of loss of balance, slurred speech after waking up. In the ED he was noted to have blood pressure elevated to 182/111 CT head negative for acute changes MRI brain showed acute ischemia at the dorsal lateral right medulla oblongata. CTA head/neck with variant anteriorcerebral anatomy, diminutive L anterior cerebral artery (concern possibly acutely occluded).  Intrcranial atherosclerotic disease diffusely affecting the anterior, middle, and posterior cerebral artery vessels.  Moderate stenosis of distal M1 segment on L. Atherosclerotic disease in both carotid siphon regions with stenosis approaching 50% particularly on the R Admitted to hospitalist service Neurology consulted.  Stroke work-up completed. Pending SNF at this time  Subjective: Patient was seen and examined this afternoon.  Middle-aged African-American male.  Not in distress. Chart reviewed Blood pressure and blood sugar are both elevated.  Medications adjusted.  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   -Imaging findings as above  -Neurology consult appreciated. -Recommended DAPT with aspirin and Plavix for 3 weeks followed by aspirin alone starting 09/23/2021 -Initially recommended for CIR but unfortunately not able to go to CIR or SNF because of lack of insurance -TOC consulted for home with services.  Type 2 diabetes mellitus -A1c 8.1 on 09/02/2021 -Was supposed to be on but not taking oral antidiabetic medications at home -Currently on metformin 500 mg twice daily.  Blood sugar  level remains elevated over 200 mostly.  Glipizide added. Recent Labs  Lab 09/05/21 1147 09/05/21 1622 09/05/21 2302 09/06/21 0508 09/06/21 1127  GLUCAP 185* 219* 261* 287* 169*   Essential hypertension -Blood pressure continues to remain elevated to 180s in last 24 hours  -Currently on HCTZ 25 mg daily and lisinopril 10 mg daily.  Add Coreg 3.125 mg twice daily.  Switch from lisinopril to losartan 50 mg daily.  Continue HCTZ 25 mg daily. Continue to monitor blood pressure.  May need further adjustment.  Hyperlipidemia -Continue with statin therapy.   Goals of care   Code Status: Full Code    Mobility: PT eval obtained  Skin assessment:     Nutritional status:  Body mass index is 26.99 kg/m.          Diet:  Diet Order             Diet regular Room service appropriate? Yes with Assist; Fluid consistency: Thin  Diet effective now                   DVT prophylaxis:  SCDs Start: 09/02/21 0210   Antimicrobials: None Fluid: None Consultants: None Family Communication: None at bedside  Status is: Inpatient  Continue in-hospital care because: Pending placement Level of care: Med-Surg   Dispo: The patient is from: Home              Anticipated d/c is to: Unclear discharge plan              Patient currently is medically stable to d/c.   Difficult to place patient No     Infusions:    Scheduled Meds:  aspirin  81 mg Oral Daily   atorvastatin  80  mg Oral Daily   carvedilol  3.125 mg Oral BID WC   clopidogrel  75 mg Oral Daily   glipiZIDE  2.5 mg Oral QAC breakfast   hydrochlorothiazide  25 mg Oral Daily   insulin aspart  0-6 Units Subcutaneous Q6H   losartan  50 mg Oral Daily   metFORMIN  500 mg Oral BID WC    PRN meds: acetaminophen **OR** acetaminophen, labetalol, LORazepam, ondansetron (ZOFRAN) IV, mouth rinse   Antimicrobials: Anti-infectives (From admission, onward)    None       Objective: Vitals:   09/06/21 1206 09/06/21  1207  BP: (!) 179/105 (!) 179/98  Pulse: 79 80  Resp: 20   Temp: 98.4 F (36.9 C)   SpO2: 100% 100%    Intake/Output Summary (Last 24 hours) at 09/06/2021 1407 Last data filed at 09/06/2021 0300 Gross per 24 hour  Intake --  Output 800 ml  Net -800 ml   Filed Weights   09/02/21 2242 09/05/21 0407 09/06/21 0324  Weight: 83.2 kg 82.1 kg 82.9 kg   Weight change: 0.8 kg Body mass index is 26.99 kg/m.   Physical Exam: General exam: Pleasant middle-aged African-American male Skin: No rashes, lesions or ulcers. HEENT: Atraumatic, normocephalic, no obvious bleeding Lungs: Clear to auscultation bilaterally CVS: Regular rate and rhythm, no murmur GI/Abd soft, nontender, nondistended, bowel sound present CNS: Alert, awake, oriented x3 Psychiatry: Sad affect Extremities: No pedal edema, no calf tenderness  Data Review: I have personally reviewed the laboratory data and studies available.  F/u labs ordered Unresulted Labs (From admission, onward)    None       Signed, [name], MD [facility] 09/06/2021

## 2021-09-07 LAB — GLUCOSE, CAPILLARY
Glucose-Capillary: 135 mg/dL — ABNORMAL HIGH (ref 70–99)
Glucose-Capillary: 133 mg/dL — ABNORMAL HIGH (ref 70–99)
Glucose-Capillary: 152 mg/dL — ABNORMAL HIGH (ref 70–99)

## 2021-09-07 NOTE — Progress Notes (Signed)
PROGRESS NOTE  Shawn Austin  DOB: [birth year]  PCP: Judee Clara, FNP NWG:[id number]  DOA: 09/01/2021  LOS: 5 days  Hospital Day: 7  Brief narrative: Shawn Austin is a 56 y.o. male with PMH significant for DM2, HTN, HLD 09/01/2021, patient presented to the ED from home with complaint of loss of balance, slurred speech after waking up. In the ED he was noted to have blood pressure elevated to 182/111 CT head negative for acute changes MRI brain showed acute ischemia at the dorsal lateral right medulla oblongata. CTA head/neck with variant anteriorcerebral anatomy, diminutive L anterior cerebral artery (concern possibly acutely occluded).  Intrcranial atherosclerotic disease diffusely affecting the anterior, middle, and posterior cerebral artery vessels.  Moderate stenosis of distal M1 segment on L. Atherosclerotic disease in both carotid siphon regions with stenosis approaching 50% particularly on the R Admitted to hospitalist service Neurology consulted.  Stroke work-up completed. Pending SNF at this time  Subjective: Patient was seen and examined this afternoon.   Sitting up in chair.  Not in distress. Blood pressure and blood sugar are both elevated.  Medications adjusted.  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   -Imaging findings as above  -Neurology consult appreciated. -Recommended DAPT with aspirin and Plavix for 3 weeks followed by aspirin alone starting 09/23/2021 -Initially recommended for CIR but unfortunately not able to go to CIR or SNF because of lack of insurance -TOC consulted for home with services.  Type 2 diabetes mellitus -A1c 8.1 on 09/02/2021 -Was supposed to be on but not taking oral antidiabetic medications at home -Currently blood sugar is better after glipizide was added yesterday.  Continue metformin  5 mg twice daily and glipizide 2.5 mg daily.  Continue sliding scale insulin with Accu-Cheks. Recent Labs  Lab 09/06/21 1127 09/06/21 1559 09/06/21 2342 09/07/21 0620 09/07/21 1145  GLUCAP 169* 118* 115* 135* 133*   Essential hypertension -Blood pressure improving now after Coreg was added yesterday.   -Continue Coreg 3.125 mg twice daily, HCTZ 25 mg daily and losartan 50 mg daily.  Continue to monitor blood pressure.  May need further adjustment.  Hyperlipidemia -Continue with statin therapy.   Goals of care   Code Status: Full Code    Mobility: PT eval obtained  Skin assessment:     Nutritional status:  Body mass index is 28.06 kg/m.          Diet:  Diet Order             Diet regular Room service appropriate? Yes with Assist; Fluid consistency: Thin  Diet effective now                   DVT prophylaxis:  SCDs Start: 09/02/21 0210   Antimicrobials: None Fluid: None Consultants: None Family Communication: None at bedside  Status is: Inpatient  Continue in-hospital care because: Pending placement Level of care: Med-Surg   Dispo: The patient is from: Home              Anticipated d/c is to: Unclear discharge plan              Patient currently is medically stable to d/c.   Difficult to place patient No   Infusions:    Scheduled Meds:  aspirin  81 mg Oral Daily   atorvastatin  80 mg Oral Daily   carvedilol  3.125 mg Oral BID  WC   clopidogrel  75 mg Oral Daily   glipiZIDE  2.5 mg Oral QAC breakfast   hydrochlorothiazide  25 mg Oral Daily   insulin aspart  0-6 Units Subcutaneous Q6H   losartan  50 mg Oral Daily   metFORMIN  500 mg Oral BID WC    PRN meds: acetaminophen **OR** acetaminophen, labetalol, LORazepam, ondansetron (ZOFRAN) IV, mouth rinse   Antimicrobials: Anti-infectives (From admission, onward)    None       Objective: Vitals:   09/06/21 2343 09/07/21 0858  BP: (!) 149/92 (!) 160/95  Pulse: 87 93  Resp: 15 20   Temp: 97.8 F (36.6 C) 98.3 F (36.8 C)  SpO2: 99% 100%    Intake/Output Summary (Last 24 hours) at 09/07/2021 1150 Last data filed at 09/07/2021 0657 Gross per 24 hour  Intake --  Output 652 ml  Net -652 ml   Filed Weights   09/05/21 0407 09/06/21 0324 09/07/21 0500  Weight: 82.1 kg 82.9 kg 86.2 kg   Weight change: 3.3 kg Body mass index is 28.06 kg/m.   Physical Exam: General exam: Pleasant middle-aged African-American male Skin: No rashes, lesions or ulcers. HEENT: Atraumatic, normocephalic, no obvious bleeding Lungs: Clear to auscultation bilaterally CVS: Regular rate and rhythm, no murmur GI/Abd soft, nontender, nondistended, bowel sound present CNS: Alert, awake, oriented x3 Psychiatry: Sad affect Extremities: No pedal edema, no calf tenderness  Data Review: I have personally reviewed the laboratory data and studies available.  F/u labs ordered Unresulted Labs (From admission, onward)    None       Signed, [name], MD [facility] 09/07/2021

## 2021-09-08 LAB — GLUCOSE, CAPILLARY
Glucose-Capillary: 144 mg/dL — ABNORMAL HIGH (ref 70–99)
Glucose-Capillary: 185 mg/dL — ABNORMAL HIGH (ref 70–99)
Glucose-Capillary: 224 mg/dL — ABNORMAL HIGH (ref 70–99)
Glucose-Capillary: 240 mg/dL — ABNORMAL HIGH (ref 70–99)
Glucose-Capillary: 88 mg/dL (ref 70–99)

## 2021-09-08 NOTE — Plan of Care (Signed)

## 2021-09-08 NOTE — Progress Notes (Signed)
Occupational Therapy Treatment Patient Details Name: Shawn Austin MRN: [id number] DOB: 12-Jul-1965 Today's Date: 09/08/2021   History of present illness 56 y.o. male with medical history significant for type 2 diabetes mellitus, hypertension, hyperlipidemia who is admitted to [facility] on 09/01/2021 with acute ischemic stroke (right dorsal lateral right medulla oblongata) after presenting from home to [facility] ED complaining of disequilibrium.   OT comments  Pt progressing towards established OT goals and motivated to participate in therapy. Performing sit<> stand transfer with mod A. Pt performing grooming while standing at sink with Mod A for standing balance and verbal cues for safety/posture. Pt with slightly increased awareness this session, with ability to reflect on standing balance during grooming. Pt observed with skill to compensate for R gaze preference through use of head turn and proprioception. Challenging occulomotor function with visual scanning and tracking through midline towards L side of body to locate items for ADL. Pt requiring mod verbal cues and occasional visual cues for shifting gaze toward L side of environment. Pt continues to present with decresed balance, coordination, safety, and cognition. Recommend AIR for continued OT services to optimize safety and independence in functional mobility, ADL, and IADL. Will continue to follow acutely as admitted.   Recommendations for follow up therapy are one component of a multi-disciplinary discharge planning process, led by the attending physician.  Recommendations may be updated based on patient status, additional functional criteria and insurance authorization.    Follow Up Recommendations  Acute inpatient rehab (3hours/day)    Assistance Recommended at Discharge Frequent or constant Supervision/Assistance  Patient can return home with the following  Two people to help with walking and/or transfers;A lot of help with  bathing/dressing/bathroom;Assistance with cooking/housework;Direct supervision/assist for medications management;Direct supervision/assist for financial management;Assist for transportation;Help with stairs or ramp for entrance   Equipment Recommendations  Other (comment) (Defer to next venue of care)    Recommendations for Other Services Speech consult    Precautions / Restrictions Precautions Precautions: Fall Precaution Comments: R gaze preference Restrictions Weight Bearing Restrictions: No       Mobility Bed Mobility               General bed mobility comments: Pt in recliner upon arrival and departure    Transfers Overall transfer level: Needs assistance Equipment used: None Transfers: Sit to/from Stand Sit to Stand: Mod assist           General transfer comment: Sit<> stand at sink for participation in ADL. Mod A for power up and maintaining static standing balance     Balance Overall balance assessment: Needs assistance Sitting-balance support: Feet supported, Single extremity supported, Bilateral upper extremity supported Sitting balance-Leahy Scale: Poor Sitting balance - Comments: Pt with R lateral lean, and reliance on BUE when back is not supported in recliner     Standing balance-Leahy Scale: Poor Standing balance comment: Pt initially requiring min A for standing balance, however, Mod A with washing face and to maintain static standing with BUE supported on sink.                           ADL either performed or assessed with clinical judgement   ADL Overall ADL's : Needs assistance/impaired Eating/Feeding: Sitting;Minimal assistance Eating/Feeding Details (indicate cue type and reason): Pt eating on arrival and requiring min verbal cues to scan tray to locate items during eating. Pt observed to return items to R side of tray, and with decreased  orientation to midline during self feeding performance. Pt observed to compensate for  scanning with head turns and use of proprioception. Pt required verbal cues as well as visual cues for occulomotor movement to locate items Grooming: Wash/dry face;Moderate assistance;Standing Grooming Details (indicate cue type and reason): Pt washing face standing at sink. Pt observed to lean forward onto sink and support weight with LUE during grooming. Pt required mod A for standing balance due to R lateral lean. Pt with decreased awareness, requiring mod verbal cues for upright position (find yourself in the mirror), as well as therapist initiated rest break. Pt denying being dizzy following washing face and therapist-initiated rest break. VSS. Pt with increased awareness as indicated by adjusting faucet to achieve warmer water to wash face. Pt with beginning emergent awareness, reporting that during standing his RLE felt like it was longer than the LLE or that something was off.                   Toilet Transfer Details (indicate cue type and reason): Sit<> stand transfer in preparation for additional participation in ADL. Pt requiring mod A.         Functional mobility during ADLs: Moderate assistance (sit<> stand) General ADL Comments: limited by balance, coordination, vision and cognition. Pt with frequent bouts of dysmetria while reaching for items and compensation with head turns.    Extremity/Trunk Assessment Upper Extremity Assessment Upper Extremity Assessment: RUE deficits/detail;LUE deficits/detail RUE Deficits / Details: strength WFL, decreased GMC with finger to nose testing RUE Sensation: WNL RUE Coordination: decreased gross motor LUE Deficits / Details: strength WFL, decreased fine and gross motor coordination with testing LUE Sensation: WNL LUE Coordination: decreased gross motor;decreased fine motor   Lower Extremity Assessment Lower Extremity Assessment: Defer to PT evaluation        Vision   Vision Assessment?: Yes Eye Alignment: Impaired (comment) (eyes  shifted to R of midline) Ocular Range of Motion: Restricted on the left (Able to scan L with increased time and effiort) Alignment/Gaze Preference: Gaze right;Head tilt Tracking/Visual Pursuits: Impaired - to be further tested in functional context (Increased time to scan towards L from R. Pt with smooth, slow tracking) Additional Comments: Pt with significant R gaze preference this session. Pt initially reporting he cannot scan left, however, with skill to scan toward L side of environment on command with increased time as session progressed. Pt with difficulty following verbal commands to scan toward L side to locate items within the environment, however, with good skill for visual tracking to L when provided visual cue (i.e. can you follow my pen to find the other therapist with your eyes). Pt performing visual tracking through midline toward L x3 additional attempts to hand items to therapist on L side of body while visually following with eyes. Pt performing visual scanning w/o visual cue to track ~5x this session, however, inconsistently able to perform. Therapist placed sticky notes on tray with arrows for pt to continue repetitions of visual tracking to locate items on l side of tray table.   Perception Perception Perception: Impaired (Decreased attention to L side of environment.)   Praxis Praxis Praxis: Not tested    Cognition Arousal/Alertness: Awake/alert Behavior During Therapy: Impulsive Overall Cognitive Status: Impaired/Different from baseline Area of Impairment: Orientation, Attention, Following commands, Safety/judgement, Awareness, Problem solving, Memory                 Orientation Level: Time Current Attention Level: Sustained Memory: Decreased short-term memory Following Commands:  Follows one step commands with increased time Safety/Judgement: Decreased awareness of safety, Decreased awareness of deficits Awareness: Intellectual Problem Solving: Slow processing,  Difficulty sequencing, Requires verbal cues, Requires tactile cues General Comments: Pt presents disoriented to time reporting that it is Tuesday. Pt with poor safety awareness during session, requiring verbal cues for safety throughout. With STM difficulty reporting he ambulated to restroom last night and the nurse only helped him to the door.        Exercises      Shoulder Instructions       General Comments Poor safety awareness and impulsive, however, agreeable to OT session.    Pertinent Vitals/ Pain       Pain Assessment Pain Assessment: Faces Faces Pain Scale: No hurt Pain Intervention(s): Monitored during session  Home Living                                          Prior Functioning/Environment              Frequency  Min 3X/week        Progress Toward Goals  OT Goals(current goals can now be found in the care plan section)  Progress towards OT goals: Progressing toward goals  Acute Rehab OT Goals Patient Stated Goal: Get better OT Goal Formulation: With patient Time For Goal Achievement: 09/17/21 Potential to Achieve Goals: Fair ADL Goals Pt Will Perform Grooming: with min guard assist;standing Pt Will Perform Upper Body Bathing: sitting;with supervision Pt Will Perform Lower Body Dressing: with min guard assist;sitting/lateral leans;sit to/from stand;with adaptive equipment Pt Will Transfer to Toilet: with supervision;stand pivot transfer;ambulating Pt Will Perform Toileting - Clothing Manipulation and hygiene: with supervision;sitting/lateral leans Pt/caregiver will Perform Home Exercise Program: With written HEP provided;Both right and left upper extremity;With theraband;With theraputty Additional ADL Goal #1: Pt will scan towards L side to locate 3/5 items during ADL task without cueing.  Plan Discharge plan remains appropriate    Co-evaluation                 AM-PAC OT "6 Clicks" Daily Activity     Outcome Measure    Help from another person eating meals?: A Little Help from another person taking care of personal grooming?: A Lot Help from another person toileting, which includes using toliet, bedpan, or urinal?: A Lot Help from another person bathing (including washing, rinsing, drying)?: A Lot Help from another person to put on and taking off regular upper body clothing?: A Little Help from another person to put on and taking off regular lower body clothing?: A Lot 6 Click Score: 14    End of Session Equipment Utilized During Treatment: Gait belt  OT Visit Diagnosis: Other abnormalities of gait and mobility (R26.89);Other symptoms and signs involving cognitive function;Other symptoms and signs involving the nervous system (R29.898);Low vision, both eyes (H54.2)   Activity Tolerance Patient tolerated treatment well   Patient Left in chair;with call bell/phone within reach;with chair alarm set   Nurse Communication Mobility status;Precautions        Time: 0347-4259 OT Time Calculation (min): 25 min  Charges: OT General Charges $OT Visit: 1 Visit OT Treatments $Self Care/Home Management : 23-37 mins  Ladene Artist, OTR/L [facility] Acute Rehabilitation Office: [phone]   Drue Novel 09/08/2021, 2:05 PM

## 2021-09-08 NOTE — Progress Notes (Signed)
Inpatient Rehab Admissions Coordinator:   Per therpy recommendations,  patient was screened for CIR candidacy by Megan Salon, MS, CCC-SLP. At this time, Pt. Appears to be a a potential candidate for CIR. I will place  order for rehab consult per protocol for full assessment. Note that CIR continues to hold admissions, but  Pt. Currently has no other offers for SNF or AIR  I am hopeful to have bed later this week, but that remains unclear at this time.  Please contact me any with questions.  Megan Salon, MS, CCC-SLP Rehab Admissions Coordinator  [phone] (celll) [phone] (office)

## 2021-09-08 NOTE — Progress Notes (Signed)
Physical Therapy Treatment Patient Details Name: Shawn Austin MRN: [id number] DOB: Mar 07, 1965 Today's Date: 09/08/2021   History of Present Illness 56 y.o. male with medical history significant for type 2 diabetes mellitus, hypertension, hyperlipidemia who is admitted to [facility] on 09/01/2021 with acute ischemic stroke (right dorsal lateral right medulla oblongata) after presenting from home to [facility] ED complaining of disequilibrium.    PT Comments    Received pt semi-reclined in bed, pt agreeable to PT treatment, but impulsive and with poor safety awareness/insight into deficits throughout session. Noted pt also with significant R gaze preference. Pt performed bed mobility with supervision and transferred bed<>recliner to R stand<>pivot with mod A. Pt then stood x 2 reps from recliner with RW and mod A with cues for technique but then reported increased lightheadedness (improved slightly with rest). Continue to recommend AIR at this time. Acute PT to cont to follow.     Recommendations for follow up therapy are one component of a multi-disciplinary discharge planning process, led by the attending physician.  Recommendations may be updated based on patient status, additional functional criteria and insurance authorization.  Follow Up Recommendations  Acute inpatient rehab (3hours/day)     Assistance Recommended at Discharge Frequent or constant Supervision/Assistance  Patient can return home with the following Assistance with cooking/housework;Direct supervision/assist for medications management;Direct supervision/assist for financial management;Assist for transportation;Help with stairs or ramp for entrance;A lot of help with walking and/or transfers;A lot of help with bathing/dressing/bathroom   Equipment Recommendations  Other (comment) (TBD)    Recommendations for Other Services Rehab consult;OT consult     Precautions / Restrictions Precautions Precautions:  Fall Precaution Comments: R gaze preference Restrictions Weight Bearing Restrictions: No     Mobility  Bed Mobility Overal bed mobility: Needs Assistance Bed Mobility: Supine to Sit Rolling: Supervision         General bed mobility comments: HOB elevated and use of bedrails; required cues for attention to RLE when advancing legs to EOB Patient Response: Impulsive  Transfers Overall transfer level: Needs assistance Equipment used: Rolling walker (2 wheels), None Transfers: Sit to/from Stand, Bed to chair/wheelchair/BSC Sit to Stand: Mod assist Stand pivot transfers: Mod assist         General transfer comment: Transferred bed<>recliner to R with mod A - cues for attention. Then stood x 2 reps from recliner with RW and mod A with cues for hand placement and to reach back prior to sitting. Upon standing, pt with generalized unsteadiness and reported lightheadedness that resolved with seated rest break.    Ambulation/Gait               General Gait Details: no +2 available and unable to safely ambulate without +2 due to impulsivity   Stairs             Wheelchair Mobility    Modified Rankin (Stroke Patients Only)       Balance Overall balance assessment: Needs assistance Sitting-balance support: Feet supported, Single extremity supported, Bilateral upper extremity supported Sitting balance-Leahy Scale: Poor Sitting balance - Comments: Pt with R lateral lean sitting EOB and with heavy reliance on BUE support Postural control: Right lateral lean Standing balance support: Bilateral upper extremity supported, During functional activity, Reliant on assistive device for balance (RW) Standing balance-Leahy Scale: Poor Standing balance comment: pt required heavy min/mod A for static standing balance and with mild R lateral lean into therapist  Cognition Arousal/Alertness: Awake/alert Behavior During Therapy:  Impulsive Overall Cognitive Status: Impaired/Different from baseline                       Memory: Decreased short-term memory Following Commands: Follows one step commands with increased time Safety/Judgement: Decreased awareness of safety, Decreased awareness of deficits   Problem Solving: Slow processing, Difficulty sequencing, Requires verbal cues, Requires tactile cues General Comments: pt with very poor safety awareness and possible confusion/confabulation, telling therapist he walked to and from the bathroom by himself last night.        Exercises      General Comments General comments (skin integrity, edema, etc.): poor safety awareness and insight into deficits      Pertinent Vitals/Pain Pain Assessment Pain Assessment: No/denies pain    Home Living                          Prior Function            PT Goals (current goals can now be found in the care plan section) Acute Rehab PT Goals Patient Stated Goal: get better PT Goal Formulation: With patient Time For Goal Achievement: 09/17/21 Potential to Achieve Goals: Good Progress towards PT goals: Progressing toward goals    Frequency    Min 4X/week      PT Plan Current plan remains appropriate    Co-evaluation              AM-PAC PT "6 Clicks" Mobility   Outcome Measure  Help needed turning from your back to your side while in a flat bed without using bedrails?: A Little Help needed moving from lying on your back to sitting on the side of a flat bed without using bedrails?: A Little Help needed moving to and from a bed to a chair (including a wheelchair)?: A Lot Help needed standing up from a chair using your arms (e.g., wheelchair or bedside chair)?: A Lot Help needed to walk in hospital room?: A Lot Help needed climbing 3-5 steps with a railing? : Total 6 Click Score: 13    End of Session Equipment Utilized During Treatment: Gait belt Activity Tolerance: Patient  tolerated treatment well Patient left: in chair;with call bell/phone within reach;with chair alarm set Nurse Communication: Mobility status PT Visit Diagnosis: Unsteadiness on feet (R26.81);Other abnormalities of gait and mobility (R26.89);Hemiplegia and hemiparesis;Muscle weakness (generalized) (M62.81) Hemiplegia - Right/Left: Left Hemiplegia - dominant/non-dominant: Non-dominant Hemiplegia - caused by: Cerebral infarction     Time: 0258-5277 PT Time Calculation (min) (ACUTE ONLY): 12 min  Charges:  $Therapeutic Activity: 8-22 mins                     Raechel Chute PT, DPT  Alfonso Patten 09/08/2021, 12:08 PM

## 2021-09-08 NOTE — Progress Notes (Signed)
PROGRESS NOTE  Shawn Austin  DOB: 21-Jun-1965  PCP: Judee Clara, FNP LGX:[id number]  DOA: 09/01/2021  LOS: 6 days  Hospital Day: 8  Brief narrative: Shawn Austin is a 56 y.o. male with PMH significant for DM2, HTN, HLD 09/01/2021, patient presented to the ED from home with complaint of loss of balance, slurred speech after waking up. In the ED he was noted to have blood pressure elevated to 182/111 CT head negative for acute changes MRI brain showed acute ischemia at the dorsal lateral right medulla oblongata. CTA head/neck with variant anteriorcerebral anatomy, diminutive L anterior cerebral artery (concern possibly acutely occluded).  Intrcranial atherosclerotic disease diffusely affecting the anterior, middle, and posterior cerebral artery vessels.  Moderate stenosis of distal M1 segment on L. Atherosclerotic disease in both carotid siphon regions with stenosis approaching 50% particularly on the R Admitted to hospitalist service Neurology consulted.  Stroke work-up completed. Pending SNF at this time  Subjective: Patient was seen and examined this morning.  Not in distress.  No new symptoms. Blood pressure better, Blood sugar better.  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   -Imaging findings as above  -Neurology consult appreciated. -Recommended DAPT with aspirin and Plavix for 3 weeks followed by aspirin alone starting 09/23/2021 -Initially recommended for CIR but unfortunately not able to go to CIR or SNF because of lack of insurance -TOC consulted for home with services.  Type 2 diabetes mellitus -A1c 8.1 on 09/02/2021 -Was supposed to be on but not taking oral antidiabetic medications at home -Currently blood sugar is better after glipizide was added on 7/22.  Continue metformin 500 mg twice daily and glipizide 2.5 mg  daily. Continue sliding scale insulin with Accu-Cheks. Change to carb controlled diet. Recent Labs  Lab 09/07/21 1145 09/07/21 1652 09/08/21 0017 09/08/21 0619 09/08/21 1158  GLUCAP 133* 152* 224* 144* 240*    Essential hypertension -Blood pressure improving now after Coreg was added on 7/22.   -Continue Coreg 3.125 mg twice daily, HCTZ 25 mg daily and losartan 50 mg daily.   -Continue to monitor blood pressure.  May need further adjustment.  Hyperlipidemia -Continue with statin therapy.   Goals of care   Code Status: Full Code    Mobility: PT eval obtained.  Pending CIR  Skin assessment:     Nutritional status:  Body mass index is 28.06 kg/m.          Diet:  Diet Order             Diet regular Room service appropriate? Yes with Assist; Fluid consistency: Thin  Diet effective now                   DVT prophylaxis:  SCDs Start: 09/02/21 0210   Antimicrobials: None Fluid: None Consultants: None Family Communication: None at bedside  Status is: Inpatient  Continue in-hospital care because: Pending placement Level of care: Med-Surg   Dispo: The patient is from: Home              Anticipated d/c is to: Unclear discharge plan              Patient currently is medically stable to d/c.   Difficult to place patient No   Infusions:    Scheduled Meds:  aspirin  81 mg Oral Daily   atorvastatin  80 mg Oral Daily   carvedilol  3.125 mg Oral BID WC   clopidogrel  75 mg Oral Daily   glipiZIDE  2.5 mg Oral QAC breakfast   hydrochlorothiazide  25 mg Oral Daily   insulin aspart  0-6 Units Subcutaneous Q6H   losartan  50 mg Oral Daily   metFORMIN  500 mg Oral BID WC    PRN meds: acetaminophen **OR** acetaminophen, labetalol, LORazepam, ondansetron (ZOFRAN) IV, mouth rinse   Antimicrobials: Anti-infectives (From admission, onward)    None       Objective: Vitals:   09/08/21 0749 09/08/21 1203  BP: (!) 141/86 (!) 152/99  Pulse: 92 91   Resp: 18 18  Temp: 98.6 F (37 C) 98.6 F (37 C)  SpO2: 100% 100%    Intake/Output Summary (Last 24 hours) at 09/08/2021 1400 Last data filed at 09/08/2021 0400 Gross per 24 hour  Intake --  Output 300 ml  Net -300 ml    Filed Weights   09/05/21 0407 09/06/21 0324 09/07/21 0500  Weight: 82.1 kg 82.9 kg 86.2 kg   Weight change:  Body mass index is 28.06 kg/m.   Physical Exam: General exam: Pleasant middle-aged African-American male Skin: No rashes, lesions or ulcers. HEENT: Atraumatic, normocephalic, no obvious bleeding Lungs: Clear to auscultation bilaterally. CVS: Regular rate and rhythm, no murmur GI/Abd soft, nontender, nondistended, bowel sound present CNS: Alert, awake, oriented x3 Psychiatry: Mood appropriate Extremities: No pedal edema, no calf tenderness  Data Review: I have personally reviewed the laboratory data and studies available.  F/u labs ordered Unresulted Labs (From admission, onward)    None       Signed, [name], MD [facility] 09/08/2021

## 2021-09-09 ENCOUNTER — Inpatient Hospital Stay (HOSPITAL_COMMUNITY): Payer: Medicaid Other

## 2021-09-09 LAB — GLUCOSE, CAPILLARY
Glucose-Capillary: 99 mg/dL (ref 70–99)
Glucose-Capillary: 94 mg/dL (ref 70–99)
Glucose-Capillary: 80 mg/dL (ref 70–99)
Glucose-Capillary: 134 mg/dL — ABNORMAL HIGH (ref 70–99)

## 2021-09-09 MED ORDER — LOSARTAN POTASSIUM 50 MG PO TABS
50.0000 mg | ORAL_TABLET | Freq: Every day | ORAL | Status: DC
  Administered 2021-09-09 – 2021-09-10 (×2): 50 mg via ORAL
  Filled 2021-09-09 (×2): qty 1

## 2021-09-09 MED ORDER — GADOBUTROL 1 MMOL/ML IV SOLN
8.0000 mL | Freq: Once | INTRAVENOUS | Status: AC | PRN
  Administered 2021-09-09: 8 mL via INTRAVENOUS

## 2021-09-09 NOTE — Progress Notes (Addendum)
Inpatient Rehab Admissions Coordinator:     I met with patient at bedside, and significant other, Katina by phone to discuss CIR recommendations and goals/expectations of CIR stay.  We reviewed 3 hrs/day of therapy, physician follow up, and average length of stay 2 weeks (dependent upon progress) with goals of close supervision/min A level.  Patient/significant other are interested in pursuing CIR. She confirms that she will be able to make arrangements to provide 24/7 supervision for patient upon his discharge from CIR.  Will continue to follow for medical readiness and bed availability.   Rehab Admissons Coordinator [place], [place], [place] [phone]

## 2021-09-09 NOTE — TOC Transition Note (Signed)
Transition of Care [facility]) - CM/SW Discharge Note   Patient Details  Name: Shawn Austin MRN: [id number] Date of Birth: 19-Sep-1965  Transition of Care [facility]) CM/SW Contact:  Kermit Balo, RN Phone Number: 09/09/2021, 12:48 PM   Clinical Narrative:    Pt discharging to CIR today. CM signing off.    Final next level of care: IP Rehab Facility Barriers to Discharge: Barriers Unresolved (comment), Inadequate or no insurance   Patient Goals and CMS Choice   CMS Medicare.gov Compare Post Acute Care list provided to:: Patient Choice offered to / list presented to : Patient  Discharge Placement                       Discharge Plan and Services   Discharge Planning Services: CM Consult Post Acute Care Choice: IP Rehab                               Social Determinants of Health (SDOH) Interventions     Readmission Risk Interventions     No data to display

## 2021-09-09 NOTE — H&P (Signed)
Physical Medicine and Rehabilitation Admission H&P    Chief Complaint  Patient presents with   Stroke with functional deficits.     HPI:  Shawn Austin. Shawn Austin is a 56 year old male with history of    ROS   Past Medical History:  Diagnosis Date   Anemia    Articular cartilage disorder of hip    Cerebral ischemia    Congenital deformity of right hip joint    Degeneration of cervical intervertebral disc    Diabetes mellitus without complication (HCC)    type 2   Fatigue    Hyperglycemia due to diabetes mellitus (HCC)    Hyperlipidemia    Hypertension    Hypocalcemia    Hyponatremia    Lower limb pain, inferior, right    Mild cognitive disorder    Pain in joint of left knee    Recurrent falls    Spondylolisthesis of lumbar region    Spondylolisthesis, lumbar region     History reviewed. No pertinent surgical history.   History reviewed. No pertinent family history.   Social History:  reports that he has never smoked. He does not have any smokeless tobacco history on file. He reports that he does not drink alcohol and does not use drugs.   Allergies: No Known Allergies   Medications Prior to Admission  Medication Sig Dispense Refill   glipiZIDE (GLUCOTROL) 10 MG tablet Take 10 mg by mouth daily before breakfast. (Patient not taking: Reported on 09/01/2021)     metFORMIN (GLUCOPHAGE) 500 MG tablet Take 1 tablet (500 mg total) by mouth 2 (two) times daily with a meal. (Patient not taking: Reported on 09/01/2021) 60 tablet 3   metoprolol succinate (TOPROL-XL) 50 MG 24 hr tablet Take 1 tablet (50 mg total) by mouth daily. Take with or immediately following a meal. (Patient not taking: Reported on 09/01/2021) 90 tablet 3      Home: Home Living Family/patient expects to be discharged to:: Private residence Living Arrangements: Spouse/significant other, Children Available Help at Discharge: Family, Available PRN/intermittently (son and girlfriend) Type of Home:  House Home Access: Stairs to enter Secretary/administrator of Steps: 5 Entrance Stairs-Rails: Right Home Layout: Two level, 1/2 bath on main level, Bed/bath upstairs (could stay on 1st floor if needed) Alternate Level Stairs-Number of Steps: 20 Alternate Level Stairs-Rails: Right Bathroom Shower/Tub: Tub/shower unit, Health visitor: Standard Home Equipment: None  Lives With: Significant other   Functional History: Prior Function Prior Level of Function : Independent/Modified Independent, Driving Mobility Comments: reports he was not working since 20 22 ADLs Comments: reports independent with ADLs; takes son to school (junior high- 6th grade) and then just "relaxes"; questionable historion pt initally reports not driving and then reports driving son to school  Functional Status:  Mobility: Bed Mobility Overal bed mobility: Needs Assistance Bed Mobility: Rolling, Supine to Sit Rolling: Supervision Supine to sit: Supervision Sit to supine: Supervision General bed mobility comments: HOB elevated and use of bedrails. R lateral lean, coming down onto elbow upon sitting up. Transfers Overall transfer level: Needs assistance Equipment used: Rolling walker (2 wheels) Transfers: Sit to/from Stand, Bed to chair/wheelchair/BSC Sit to Stand: Mod assist Bed to/from chair/wheelchair/BSC transfer type:: Stand pivot Stand pivot transfers: Mod assist Squat pivot transfers: Mod assist, Min assist, +2 physical assistance General transfer comment: Stood from elevated EOB with RW and mod A and attempted ambulation. However pt unaware that he was not advancing RLE and began to demonstrate strong R  lateral lean upon taking 2 steps with inability to correct. Therefore, for safety purposes provided max A to step 60ft back to EOB where pt demosntrated posterior LOB onto bed. Pt then transferred bed<>recliner stand<>pivot to R with mod A due to poor sequencing/motor planning. Stood x 1  additional rep from recliner with RW and mod A and worked on L visual scanning/attention with min fading to max A for standing balance due to increased R lateral lean. Ambulation/Gait Ambulation/Gait assistance: Max assist Gait Distance (Feet): 2 Feet Assistive device: Rolling walker (2 wheels) Gait Pattern/deviations: Decreased step length - right, Step-to pattern, Decreased stride length, Decreased weight shift to left, Narrow base of support General Gait Details: need +2 for ambulation    ADL: ADL Overall ADL's : Needs assistance/impaired Eating/Feeding: Sitting, Minimal assistance Eating/Feeding Details (indicate cue type and reason): Pt eating on arrival and requiring min verbal cues to scan tray to locate items during eating. Pt observed to return items to R side of tray, and with decreased orientation to midline during self feeding performance. Pt observed to compensate for scanning with head turns and use of proprioception. Pt required verbal cues as well as visual cues for occulomotor movement to locate items Grooming: Wash/dry face, Moderate assistance, Standing Grooming Details (indicate cue type and reason): Pt washing face standing at sink. Pt observed to lean forward onto sink and support weight with LUE during grooming. Pt required mod A for standing balance due to R lateral lean. Pt with decreased awareness, requiring mod verbal cues for upright position (find yourself in the mirror), as well as therapist initiated rest break. Pt denying being dizzy following washing face and therapist-initiated rest break. VSS. Pt with increased awareness as indicated by adjusting faucet to achieve warmer water to wash face. Pt with beginning emergent awareness, reporting that during standing his RLE felt like it was longer than the LLE or that something was off. Upper Body Bathing: Minimal assistance, Sitting Lower Body Bathing: Maximal assistance, Sit to/from stand Upper Body Dressing : Minimal  assistance, Sitting Upper Body Dressing Details (indicate cue type and reason): Min A to don new gown Lower Body Dressing: Maximal assistance, Sit to/from stand, Sitting/lateral leans Toilet Transfer: Moderate assistance, Squat-pivot, +2 for physical assistance, BSC/3in1 Toilet Transfer Details (indicate cue type and reason): Sit<> stand transfer in preparation for additional participation in ADL. Pt requiring mod A. Toileting- Clothing Manipulation and Hygiene: Total assistance, Sit to/from stand, +2 for physical assistance, +2 for safety/equipment Toileting - Clothing Manipulation Details (indicate cue type and reason): Pt requiring total A for posterior pericare due to requiring mod +2 support for standing balance. Pt unaware of bowel movement in bed. Functional mobility during ADLs: Moderate assistance (sit<> stand) General ADL Comments: limited by balance, coordination, vision and cognition. Pt with frequent bouts of dysmetria while reaching for items and compensation with head turns.  Cognition: Cognition Overall Cognitive Status: Impaired/Different from baseline Arousal/Alertness: Awake/alert Orientation Level: Oriented X4 Attention: Sustained Sustained Attention: Impaired Sustained Attention Impairment: Verbal basic Memory: Impaired Memory Impairment: Storage deficit, Decreased recall of new information Awareness: Impaired Awareness Impairment: Emergent impairment, Anticipatory impairment Problem Solving: Impaired Problem Solving Impairment: Functional basic Safety/Judgment: Impaired Cognition Arousal/Alertness: Awake/alert Behavior During Therapy: Impulsive Overall Cognitive Status: Impaired/Different from baseline Area of Impairment: Safety/judgement, Awareness, Problem solving, Following commands, Attention Orientation Level: Time Current Attention Level: Sustained Memory: Decreased short-term memory Following Commands: Follows one step commands  inconsistently Safety/Judgement: Decreased awareness of safety, Decreased awareness of deficits Awareness: Intellectual Problem Solving:  Slow processing, Difficulty sequencing, Requires verbal cues, Requires tactile cues General Comments: pt impulsive and with very poor safety awareness telling therapist he is ready to go home and is able to walk on his own.   Blood pressure (!) 168/98, pulse 93, temperature 99.1 F (37.3 C), temperature source Oral, resp. rate 17, height 5\' 9"  (1.753 m), weight 86.2 kg, SpO2 100 %. Physical Exam  Results for orders placed or performed during the hospital encounter of 09/01/21 (from the past 48 hour(s))  Glucose, capillary     Status: Abnormal   Collection Time: 09/07/21 11:45 AM  Result Value Ref Range   Glucose-Capillary 133 (H) 70 - 99 mg/dL    Comment: Glucose reference range applies only to samples taken after fasting for at least 8 hours.  Glucose, capillary     Status: Abnormal   Collection Time: 09/07/21  4:52 PM  Result Value Ref Range   Glucose-Capillary 152 (H) 70 - 99 mg/dL    Comment: Glucose reference range applies only to samples taken after fasting for at least 8 hours.  Glucose, capillary     Status: Abnormal   Collection Time: 09/08/21 12:17 AM  Result Value Ref Range   Glucose-Capillary 224 (H) 70 - 99 mg/dL    Comment: Glucose reference range applies only to samples taken after fasting for at least 8 hours.   Comment 1 Notify RN    Comment 2 Document in Chart   Glucose, capillary     Status: Abnormal   Collection Time: 09/08/21  6:19 AM  Result Value Ref Range   Glucose-Capillary 144 (H) 70 - 99 mg/dL    Comment: Glucose reference range applies only to samples taken after fasting for at least 8 hours.   Comment 1 Notify RN    Comment 2 Document in Chart   Glucose, capillary     Status: Abnormal   Collection Time: 09/08/21 11:58 AM  Result Value Ref Range   Glucose-Capillary 240 (H) 70 - 99 mg/dL    Comment: Glucose  reference range applies only to samples taken after fasting for at least 8 hours.  Glucose, capillary     Status: Abnormal   Collection Time: 09/08/21  3:59 PM  Result Value Ref Range   Glucose-Capillary 185 (H) 70 - 99 mg/dL    Comment: Glucose reference range applies only to samples taken after fasting for at least 8 hours.  Glucose, capillary     Status: None   Collection Time: 09/08/21 11:12 PM  Result Value Ref Range   Glucose-Capillary 88 70 - 99 mg/dL    Comment: Glucose reference range applies only to samples taken after fasting for at least 8 hours.   Comment 1 Notify RN    Comment 2 Document in Chart   Glucose, capillary     Status: None   Collection Time: 09/09/21  6:28 AM  Result Value Ref Range   Glucose-Capillary 99 70 - 99 mg/dL    Comment: Glucose reference range applies only to samples taken after fasting for at least 8 hours.   Comment 1 Notify RN    Comment 2 Document in Chart    No results found.    Blood pressure (!) 168/98, pulse 93, temperature 99.1 F (37.3 C), temperature source Oral, resp. rate 17, height 5\' 9"  (1.753 m), weight 86.2 kg, SpO2 100 %.  Medical Problem List and Plan: 1. Functional deficits secondary to ***  -patient may *** shower  -ELOS/Goals: *** 2.  Antithrombotics: -  DVT/anticoagulation:  {VTE PROPHYLAXIS/ANTICOAGULATION - [id number]}  -antiplatelet therapy: *** 3. Pain Management: *** 4. Mood/Behavior/Sleep: ***  -antipsychotic agents: *** 5. Neuropsych/cognition: This patient *** capable of making decisions on *** own behalf. 6. Skin/Wound Care: *** 7. Fluids/Electrolytes/Nutrition: ***     ***  Jacquelynn Cree, PA-C 09/09/2021

## 2021-09-09 NOTE — Progress Notes (Signed)
Occupational Therapy Treatment Patient Details Name: Shawn Austin MRN: [id number] DOB: 04-18-1965 Today's Date: 09/09/2021   History of present illness 56 y.o. 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   OT comments  Pt received supine with R gaze and head turn. Performing bed mobility with supervision for safety due to impulsivity. Performing sit<>stand and squat pivot transfers toward R side with mod A. Pt washing face standing at sink with mod-max A for standing balance with fatigue and max verbal cues for upright posture. Performing oral care seated with mod A for problem solving, and use of LUE. Pt was observed to perseverate on oral care and with poor awareness of items in LUE. Challenging vision with scanning toward L side of environment; pt with difficulty initiating, thus requiring mod visual and verbal cues. Pt was observed with overshooting when applying toothpaste/reaching for items at sink. Pt continues to present with decreased balance, coordination, endurance, safety, awareness, problem solving, and occulomotor skills. Continue to highly recommend AIR to optimize safety and independence with ADL and IADL. Will follow acutely as admitted.    Recommendations for follow up therapy are one component of a multi-disciplinary discharge planning process, led by the attending physician.  Recommendations may be updated based on patient status, additional functional criteria and insurance authorization.    Follow Up Recommendations  Acute inpatient rehab (3hours/day)    Assistance Recommended at Discharge Frequent or constant Supervision/Assistance  Patient can return home with the following  Two people to help with walking and/or transfers;A lot  of help with bathing/dressing/bathroom;Assistance with cooking/housework;Direct supervision/assist for medications management;Direct supervision/assist for financial management;Assist for transportation;Help with stairs or ramp for entrance   Equipment Recommendations  Other (comment) (Defer to next venue of care)    Recommendations for Other Services Speech consult    Precautions / Restrictions Precautions Precautions: Fall Precaution Comments: R gaze preference, R lateral lean in sitting/standing Restrictions Weight Bearing Restrictions: No       Mobility Bed Mobility Overal bed mobility: Needs Assistance Bed Mobility: Rolling, Supine to Sit Rolling: Supervision   Supine to sit: Supervision Sit to supine: Supervision   General bed mobility comments: HOB elevated and use of bedrails. R lateral lean, coming down onto elbow upon sitting up. Close supervision for safety due to impulsivity.    Transfers Overall transfer level: Needs assistance Equipment used: None Transfers: Sit to/from Stand, Bed to chair/wheelchair/BSC Sit to Stand: Mod assist Stand pivot transfers: Mod assist Squat pivot transfers: Mod assist       General transfer comment: Mod A for squat pivot. Pt with max difficulty with problem solving when attempting squat pivot towards L, thus, squat pivot 2x toward R for safety due to +1 assist. Pt performing 3x sit<>stand this session from recliner to stand at sink. Reliant on BUE support standing at sink.     Balance Overall balance assessment: Needs assistance Sitting-balance support: Feet supported, Single extremity supported, Bilateral upper extremity supported Sitting balance-Leahy Scale: Poor Sitting balance - Comments: Pt with R lateral lean and reliance onm BUE to maintain upright sitting EOB. Postural control: Right lateral lean Standing balance support: Bilateral upper extremity supported, During functional activity, Reliant on assistive device for  balance Standing balance-Leahy Scale: Poor Standing balance comment: Pt initally able to maintain standing balance with min A  fading to max A with fatigue and decreased attention resulting in strong R lateral lean                           ADL either performed or assessed with clinical judgement   ADL Overall ADL's : Needs assistance/impaired     Grooming: Wash/dry face;Oral care;Moderate assistance;Cueing for sequencing;Sitting;Standing Grooming Details (indicate cue type and reason): Pt requring mod A and intermittent max A for standing balance at sink due to heavy R lateral lean. Pt observed with knee bucking in standing, and asking for rest break; unable to anticipate need for rest break ahead of time. Pt observed to requrie additional effort with uncoordinated movement this session as indicated by messy sink after washing face, and overshooting. Pt brushing teeth seated. Encouraging patient to visually track toothbrush/toothpaste from therapist's hand to pt hand, and pt with skill to track R to L, however, max difficulty scanning to L to locate items without anchor to trach. Pt was observed with poor coordination as inidcated by overshooting during bimanual tasks. Pt requiring Mod A for toothpaste management and to use LUE.                 Toilet Transfer: Moderate assistance;Squat-pivot;+2 for physical assistance;BSC/3in1 Toilet Transfer Details (indicate cue type and reason): Pt requring mod A for squat pivot transfer bed<>recliner and back. Pt with max difficulty following commands to transfer toward L due to inability to sustain attention to L side of environment, thus, defer transfer toward L side. Performing squat pivot toward R on both attempts for safety.         Functional mobility during ADLs: Moderate assistance (sit<> stand) General ADL Comments: limited by balance, coordination, vision and cognition. Pt with frequent bouts of dysmetria while reaching for items,  and placing toothpaste on toothbrush. Pt conpensating for visual deficits with proprioception, however, not utilizing head turns as compensatory strategy this session.    Extremity/Trunk Assessment Upper Extremity Assessment Upper Extremity Assessment: RUE deficits/detail;LUE deficits/detail RUE Deficits / Details: strength WFL, decreased GMC with finger to nose testing RUE Sensation: WNL RUE Coordination: decreased gross motor LUE Deficits / Details: strength WFL, decreased fine and gross motor coordination with testing; noting pt with rolled cloth in LUE on arrival, and pt observed to continue with rolled cloth in hand throughout session. Pt with difficulty motor planning this date with LUE. Max difficulty holding toothpaste, however, with skill to open hands at other times during session. LUE Coordination: decreased gross motor;decreased fine motor   Lower Extremity Assessment Lower Extremity Assessment: Defer to PT evaluation        Vision   Vision Assessment?: Yes Eye Alignment: Impaired (comment) (eyes shifted to R of midline) Ocular Range of Motion: Restricted on the left (Difficulty scanning L this session. Pt requiring visual cues to perform occulomotor movement through midline and toward R side. Pt observed to scan toward midline 1x without cueing this session.) Alignment/Gaze Preference: Gaze right;Head tilt Tracking/Visual Pursuits: Impaired - to be further tested in functional context (Increased time to scan towards L from R. Pt with smooth, slow tracking) Additional Comments: Significant R gaze preference. Mod-max multimodal cues to scan L side of environment. Pt with max difficulty initiating occulomotor movement toward midline. Pt requring visual cues/anchor to follow to scan toward L 10x this session. Pt with inconsistent awareness of difficulty with scanning.   Perception Perception Perception: Impaired (decreased attention to L side of environment.)  Praxis  Praxis Praxis: Not tested Praxis-Other Comments: Mild perseveration/difficulty terminating task when brushing teeth this session.    Cognition Arousal/Alertness: Awake/alert Behavior During Therapy: Impulsive Overall Cognitive Status: Impaired/Different from baseline Area of Impairment: Safety/judgement, Awareness, Problem solving, Following commands, Attention, Memory, Orientation                 Orientation Level: Time (Pt reporting he knows he had a stroke) Current Attention Level: Sustained Memory: Decreased short-term memory Following Commands: Follows one step commands inconsistently, Follows one step commands with increased time Safety/Judgement: Decreased awareness of safety, Decreased awareness of deficits Awareness: Intellectual Problem Solving: Slow processing, Difficulty sequencing, Requires verbal cues, Requires tactile cues General Comments: Pt impulsive and with poor safety awareness. Pt with inconsistent awareness of deficits; yesterdy reporting he walked to the restroom by himself, and today reporting that he feels like he is getting worse and is unable to walk. Pt with continued awareness in the moment as indicated by reporting he needs a break as his RLE began to buckle in standing. Pt requring increased time for processing throughout session. Difficulty with R/L directional terms (attempt to pivot R when asked to pivot L towards bed). Pt requiring max multimodal cues for visual scanning using occulomotor movement this date and also observed with fewer attempts at compensatory methods. Pt continues with decreased attention, as iniciated by being easily distracted by noises outside of room. Pt oriented to place, event, and person, but not time. Pt perseverating on brushing teeth with difficulty terminating task ~6 minutes and while holding rolled wash cloth at beginning of session. Pt with limited awareness of wash cloth in hand.        Exercises Exercises: Other  exercises Other Exercises Other Exercises: Performing L lateral lean to prop on L elbow while in bed 3x ~30 sec. Pt requiring max verbal cues to maintain position with attemtps to return to R lateral lean    Shoulder Instructions       General Comments VSS    Pertinent Vitals/ Pain       Pain Assessment Pain Assessment: Faces Faces Pain Scale: No hurt Pain Intervention(s): Monitored during session, Repositioned, Relaxation (Pt not reporting pain, however, reporting he is tired and would like to return to bed following therapy session.)  Home Living     Available Help at Discharge: Friend(s);Family;Available 24 hours/day                     Bathroom Accessibility: Yes          Lives With: Significant other;Other (Comment);Family (2 year old son, cousin at home also)    Prior Functioning/Environment              Frequency  Min 3X/week        Progress Toward Goals  OT Goals(current goals can now be found in the care plan section)  Progress towards OT goals: Progressing toward goals (Limited progress this date)  Acute Rehab OT Goals Patient Stated Goal: Go home OT Goal Formulation: With patient Time For Goal Achievement: 09/17/21 Potential to Achieve Goals: Fair ADL Goals Pt Will Perform Grooming: with min guard assist;standing Pt Will Perform Upper Body Bathing: sitting;with supervision Pt Will Perform Lower Body Dressing: with min guard assist;sitting/lateral leans;sit to/from stand;with adaptive equipment Pt Will Transfer to Toilet: with supervision;stand pivot transfer;ambulating Pt Will Perform Toileting - Clothing Manipulation and hygiene: with supervision;sitting/lateral leans Pt/caregiver will Perform Home Exercise Program: With written HEP provided;Both right and left upper  extremity;With theraband;With theraputty Additional ADL Goal #1: Pt will scan towards L side to locate 3/5 items during ADL task without cueing.  Plan Discharge plan  remains appropriate    Co-evaluation                 AM-PAC OT "6 Clicks" Daily Activity     Outcome Measure   Help from another person eating meals?: A Little Help from another person taking care of personal grooming?: A Lot Help from another person toileting, which includes using toliet, bedpan, or urinal?: A Lot Help from another person bathing (including washing, rinsing, drying)?: A Lot Help from another person to put on and taking off regular upper body clothing?: A Little Help from another person to put on and taking off regular lower body clothing?: A Lot 6 Click Score: 14    End of Session Equipment Utilized During Treatment: Gait belt  OT Visit Diagnosis: Other abnormalities of gait and mobility (R26.89);Other symptoms and signs involving cognitive function;Other symptoms and signs involving the nervous system (R29.898);Low vision, both eyes (H54.2)   Activity Tolerance Patient tolerated treatment well   Patient Left in bed;with call bell/phone within reach;with bed alarm set   Nurse Communication Mobility status;Precautions        Time: 7619-5093 OT Time Calculation (min): 39 min  Charges: OT General Charges $OT Visit: 1 Visit OT Treatments $Self Care/Home Management : 38-52 mins  Ladene Artist, OTR/L [facility] Acute Rehabilitation Office: [phone]   Drue Novel 09/09/2021, 2:08 PM

## 2021-09-09 NOTE — Significant Event (Signed)
Patient's MRI showed new acute strokes.  On exam at bedside patient states he feels fine.  Vitals noted.  Patient's medications noted.  Patient is able to move all extremities without any difficulty with good strength and no facial asymmetry.  Pupils equal reacting to light.  Tongue is midline.  Discussed with neurologist Dr. Otelia Limes who at this time advised keeping patient on neurochecks and patient will need transesophageal echocardiogram to find for source for cardioembolic cause.  Midge Minium.

## 2021-09-09 NOTE — Progress Notes (Signed)
Physical Therapy Treatment Patient Details Name: Shawn Austin MRN: [id number] DOB: Jul 04, 1965 Today's Date: 09/09/2021   History of Present Illness 56 y.o. 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    PT Comments    Received pt supine in bed looking to the R, pt agreeable to PT treatment, and denied any pain during session. Pt performed bed mobility with supervision and removed dirty gown and donned clean ones with assist. Pt stood from elevated EOB with RW and mod A and attempted to ambulate, however after taking 2 steps pt forgetting to advance RLE and with strong R lateral lean with inability to correct despite verbal/tactile cues. For safety purposes, provided max A to back up to EOB and pt demonstrated posterior LOB onto bed. Pt then transferred bed<>recliner stand<>pivot to R without AD and mod A - poor sequencing/motor planning resulting in posterior LOB into recliner. Stood x 1 with RW and mod A and worked on L visual scanning/attention getting pt to identify objects on L side of room - required min A fading to max A for static standing balance due to increased R lateral lean. Set pt up to eat snack - placed on L side of tray to promote attention. Pt left in recliner with all needs met. Acute PT to cont to follow.     Recommendations for follow up therapy are one component of a multi-disciplinary discharge planning process, led by the attending physician.  Recommendations may be updated based on patient status, additional functional criteria and insurance authorization.  Follow Up Recommendations  Acute inpatient rehab (3hours/day)     Assistance Recommended at Discharge Frequent or constant Supervision/Assistance  Patient can return home  with the following Assistance with cooking/housework;Direct supervision/assist for medications management;Direct supervision/assist for financial management;Assist for transportation;Help with stairs or ramp for entrance;Two people to help with walking and/or transfers;A lot of help with bathing/dressing/bathroom;Assistance with feeding   Equipment Recommendations  Other (comment) (TBD)    Recommendations for Other Services Rehab consult;OT consult     Precautions / Restrictions Precautions Precautions: Fall Precaution Comments: R gaze preference, R lateral lean in sitting/standing Restrictions Weight Bearing Restrictions: No     Mobility  Bed Mobility Overal bed mobility: Needs Assistance Bed Mobility: Rolling, Supine to Sit Rolling: Supervision   Supine to sit: Supervision     General bed mobility comments: HOB elevated and use of bedrails. R lateral lean, coming down onto elbow upon sitting up. Patient Response: Impulsive  Transfers Overall transfer level: Needs assistance Equipment used: Rolling walker (2 wheels) Transfers: Sit to/from Stand, Bed to chair/wheelchair/BSC Sit to Stand: Mod assist Stand pivot transfers: Mod assist         General transfer comment: Stood from elevated EOB with RW and mod A and attempted ambulation. However pt unaware that he was not advancing RLE and began to demonstrate strong R lateral lean upon taking 2 steps with inability to correct. Therefore, for safety purposes provided max A to step 25f back to EOB where pt demosntrated posterior LOB onto bed. Pt then transferred bed<>recliner stand<>pivot to R with mod A due to poor sequencing/motor planning. Stood x 1 additional rep from recliner with RW and mod A and worked on L visual scanning/attention with min fading to max A for standing balance due to increased R  lateral lean.    Ambulation/Gait Ambulation/Gait assistance: Max assist Gait Distance (Feet): 2 Feet Assistive device: Rolling  walker (2 wheels) Gait Pattern/deviations: Decreased step length - right, Step-to pattern, Decreased stride length, Decreased weight shift to left, Narrow base of support       General Gait Details: need +2 for ambulation   Stairs             Wheelchair Mobility    Modified Rankin (Stroke Patients Only)       Balance Overall balance assessment: Needs assistance Sitting-balance support: Feet supported, Single extremity supported, Bilateral upper extremity supported Sitting balance-Leahy Scale: Poor Sitting balance - Comments: Pt with R lateral lean, and reliance on BUE when back is not supported in recliner Postural control: Right lateral lean Standing balance support: Bilateral upper extremity supported, During functional activity, Reliant on assistive device for balance Standing balance-Leahy Scale: Poor Standing balance comment: Pt initally able to maintain standing balance with min A fading to max A with fatigue and decreased attention resulting in strong R lateral lean                            Cognition Arousal/Alertness: Awake/alert Behavior During Therapy: Impulsive Overall Cognitive Status: Impaired/Different from baseline Area of Impairment: Safety/judgement, Awareness, Problem solving, Following commands, Attention                     Memory: Decreased short-term memory Following Commands: Follows one step commands inconsistently Safety/Judgement: Decreased awareness of safety, Decreased awareness of deficits   Problem Solving: Slow processing, Difficulty sequencing, Requires verbal cues, Requires tactile cues General Comments: pt impulsive and with very poor safety awareness telling therapist he is ready to go home and is able to walk on his own.        Exercises      General Comments        Pertinent Vitals/Pain Pain Assessment Pain Assessment: No/denies pain    Home Living                          Prior  Function            PT Goals (current goals can now be found in the care plan section) Acute Rehab PT Goals Patient Stated Goal: to go home PT Goal Formulation: With patient Time For Goal Achievement: 09/17/21 Potential to Achieve Goals: Fair Progress towards PT goals: Progressing toward goals    Frequency    Min 4X/week      PT Plan Current plan remains appropriate    Co-evaluation              AM-PAC PT "6 Clicks" Mobility   Outcome Measure  Help needed turning from your back to your side while in a flat bed without using bedrails?: A Little Help needed moving from lying on your back to sitting on the side of a flat bed without using bedrails?: A Little Help needed moving to and from a bed to a chair (including a wheelchair)?: A Lot Help needed standing up from a chair using your arms (e.g., wheelchair or bedside chair)?: A Lot Help needed to walk in hospital room?: A Lot Help needed climbing 3-5 steps with a railing? : Total 6 Click Score: 13    End of Session Equipment Utilized During Treatment: Gait belt Activity Tolerance: Patient tolerated treatment well Patient left: in chair;with call bell/phone within reach;with  chair alarm set Nurse Communication: Mobility status PT Visit Diagnosis: Unsteadiness on feet (R26.81);Other abnormalities of gait and mobility (R26.89);Hemiplegia and hemiparesis;Muscle weakness (generalized) (M62.81) Hemiplegia - Right/Left: Left (both) Hemiplegia - dominant/non-dominant: Non-dominant (both) Hemiplegia - caused by: Cerebral infarction     Time: 0935-0957 PT Time Calculation (min) (ACUTE ONLY): 22 min  Charges:  $Neuromuscular Re-education: 8-22 mins                       PT, DPT   M  09/09/2021, 10:26 AM  

## 2021-09-09 NOTE — Progress Notes (Signed)
Inpatient Rehab Admissions Coordinator:    We have bed available on CIR for this patient today. Waiting for follow up MRI prior to moving him to CIR per MD request. Will continue to follow.    Rehab Admissons Coordinator [place], [place], [place] [phone]

## 2021-09-09 NOTE — PMR Pre-admission (Signed)
PMR Admission Coordinator Pre-Admission Assessment  Patient: Shawn Austin is an 56 y.o., male MRN: [id number] DOB: 09-05-1965 Height: 5' 9"  (175.3 cm) Weight: 86.2 kg  Insurance Information HMO:     PPO:      PCP:      IPA:      80/20:      OTHER:  PRIMARY: Uninsured      Policy#:       Subscriber:  CM Name:       Phone#:      Fax#:  Pre-Cert#:       Employer:  Benefits:  Phone #:      Name:  Eff. Date:      Deduct:       Out of Pocket Max:       Life Max:  CIR:       SNF:  Outpatient:      Co-Pay:  Home Health:       Co-Pay:  DME:      Co-Pay:  Providers:  SECONDARY:       Policy#:      Phone#:   Development worker, community:       Phone#:   The Engineer, petroleum" for patients in Inpatient Rehabilitation Facilities with attached "Privacy Act [place] Records" was provided and verbally reviewed with: N/A  Emergency Contact Information Contact Information     Name Relation Home Work Mobile   [place] Father [phone]     JEFFERIES,KATINA Significant other   [phone]       Current Medical History  Patient Admitting Diagnosis: CVA History of Present Illness: Patient is a 56 year old male admitted to [facility] on 09/01/21 with slurred speech, disequilibrium, and elevated BP on admission 182/111. PMH includes Type 2 DM, A1C 8.1 on 09/02/21, HTN, HLD. MRI brain showed small focus of acute ischemia at the right dorsal lateral right medulla oblongata without associated hemorrhage or mass effect. Labs were notable for the following: CMP, which showed creatinine 0.97, glucose 198. Serum ethanol level less than 10.  Urinary drug screen pan negative. EKG showed sinus rhythm with right bundle branch block, heart rate 84, and no evidence of T wave or ST changes, including no evidence of ST elevation.  Neurology recommended DAPT with aspirin and Plavix for 3 weeks followed by aspirin alone starting 09/23/2021. 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  Stroke team engaged and recommended TEE, which was non-revealing.  Recommendations are for 30 day event monitor and DAPT asa and plavix x3 months, then asa alone.  PT, OT, SLP consulted and recommending CIR.    Complete NIHSS TOTAL: 4  Patient's medical record from Shawn Austin has been reviewed by the rehabilitation admission coordinator and physician.  Past Medical History  Past Medical History:  Diagnosis Date   Anemia    Articular cartilage disorder of hip    Cerebral ischemia    Congenital deformity of right hip joint    Degeneration of cervical intervertebral disc    Diabetes mellitus without complication (HCC)    type 2   Fatigue    Hyperglycemia due to diabetes mellitus (HCC)    Hyperlipidemia    Hypertension    Hypocalcemia    Hyponatremia    Lower limb pain, inferior, right    Mild cognitive disorder    Pain in joint of left knee    Recurrent falls  Spondylolisthesis of lumbar region    Spondylolisthesis, lumbar region     Has the patient had major surgery during 100 days prior to admission? No  Family History   family history is not on file.  Current Medications  Current Facility-Administered Medications:    acetaminophen (TYLENOL) tablet 650 mg, 650 mg, Oral, Q6H PRN, 650 mg at 09/07/21 2211 **OR** acetaminophen (TYLENOL) suppository 650 mg, 650 mg, Rectal, Q6H PRN, Howerter, Justin B, DO   aspirin chewable tablet 81 mg, 81 mg, Oral, Daily, Bailey-Modzik, Delila A, NP, 81 mg at 09/09/21 0833   atorvastatin (LIPITOR) tablet 80 mg, 80 mg, Oral, Daily, Bailey-Modzik, Delila A, NP, 80 mg at 09/08/21 2103   carvedilol (COREG) tablet 3.125 mg, 3.125 mg, Oral, BID WC, Dahal, Binaya, MD, 3.125 mg at 09/09/21 7425   clopidogrel (PLAVIX) tablet 75 mg, 75 mg, Oral, Daily, Reome, Earle J, RPH, 75 mg at 09/09/21 9563   glipiZIDE (GLUCOTROL) tablet 2.5 mg, 2.5 mg, Oral, QAC breakfast,  Dahal, Binaya, MD, 2.5 mg at 09/09/21 8756   hydrochlorothiazide (HYDRODIURIL) tablet 25 mg, 25 mg, Oral, Daily, Arrien, Jimmy Picket, MD, 25 mg at 09/09/21 0833   insulin aspart (novoLOG) injection 0-6 Units, 0-6 Units, Subcutaneous, Q6H, Howerter, Justin B, DO, 1 Units at 09/08/21 1610   labetalol (NORMODYNE) injection 10 mg, 10 mg, Intravenous, Q2H PRN, [name]., MD, 10 mg at 09/06/21 0328   LORazepam (ATIVAN) injection 0.5 mg, 0.5 mg, Intravenous, Q6H PRN, [name]., MD, 0.5 mg at 09/02/21 1512   losartan (COZAAR) tablet 50 mg, 50 mg, Oral, QHS, Dahal, Binaya, MD   metFORMIN (GLUCOPHAGE) tablet 500 mg, 500 mg, Oral, BID WC, Arrien, Jimmy Picket, MD, 500 mg at 09/09/21 0833   ondansetron [facility]) injection 4 mg, 4 mg, Intravenous, Q6H PRN, Dahal, Binaya, MD, 4 mg at 09/07/21 4332   Oral care mouth rinse, 15 mL, Mouth Rinse, PRN, Howerter, Justin B, DO  Patients Current Diet:  Diet Order             Diet heart healthy/carb modified Room service appropriate? Yes; Fluid consistency: Thin  Diet effective now                   Precautions / Restrictions Precautions Precautions: Fall Precaution Comments: R gaze preference, R lateral lean in sitting/standing Restrictions Weight Bearing Restrictions: No   Has the patient had 2 or more falls or a fall with injury in the past year? Yes  Prior Activity Level Community (5-7x/wk): was using can occasionally, driving his son to camp, grocery shopping prior  Prior Functional Level Self Care: Did the patient need help bathing, dressing, using the toilet or eating? Independent  Indoor Mobility: Did the patient need assistance with walking from room to room (with or without device)? Independent  Stairs: Did the patient need assistance with internal or external stairs (with or without device)? Independent  Functional Cognition: Did the patient need help planning regular tasks such as shopping or remembering  to take medications? Independent  Patient Information Are you of Hispanic, Latino/a,or Spanish origin?: A. No, not of Hispanic, Latino/a, or Spanish origin What is your race?: B. Black or African American Do you need or want an interpreter to communicate with a doctor or health care staff?: 0. No  Patient's Response To:  Health Literacy and Transportation Is the patient able to respond to health literacy and transportation needs?: No Health Literacy - How often do you need to have someone  help you when you read instructions, pamphlets, or other written material from your doctor or pharmacy?: Patient unable to respond In the past 12 months, has lack of transportation kept you from medical appointments or from getting medications?: No In the past 12 months, has lack of transportation kept you from meetings, work, or from getting things needed for daily living?: No  Home Assistive Devices / [place] Devices/Equipment: Radio producer (specify quad or straight), Blood pressure cuff, Eyeglasses, Crutches Home Equipment: None  Prior Device Use: Indicate devices/aids used by the patient prior to current illness, exacerbation or injury? None of the above  Current Functional Level Cognition  Arousal/Alertness: Awake/alert Overall Cognitive Status: Impaired/Different from baseline Current Attention Level: Sustained Orientation Level: Oriented X4 Following Commands: Follows one step commands inconsistently Safety/Judgement: Decreased awareness of safety, Decreased awareness of deficits General Comments: pt impulsive and with very poor safety awareness telling therapist he is ready to go home and is able to walk on his own. Attention: Sustained Sustained Attention: Impaired Sustained Attention Impairment: Verbal basic Memory: Impaired Memory Impairment: Storage deficit, Decreased recall of new information Awareness: Impaired Awareness Impairment: Emergent impairment, Anticipatory  impairment Problem Solving: Impaired Problem Solving Impairment: Functional basic Safety/Judgment: Impaired    Extremity Assessment (includes Sensation/Coordination)  Upper Extremity Assessment: RUE deficits/detail, LUE deficits/detail RUE Deficits / Details: strength WFL, decreased GMC with finger to nose testing RUE Sensation: WNL RUE Coordination: decreased gross motor LUE Deficits / Details: strength WFL, decreased fine and gross motor coordination with testing LUE Sensation: WNL LUE Coordination: decreased gross motor, decreased fine motor  Lower Extremity Assessment: Defer to PT evaluation LLE Deficits / Details: strength WFL; decr coordination/ataxia LLE Sensation:  (?decr proprioception; denies numbness) LLE Coordination: decreased gross motor, decreased fine motor    ADLs  Overall ADL's : Needs assistance/impaired Eating/Feeding: Sitting, Minimal assistance Eating/Feeding Details (indicate cue type and reason): Pt eating on arrival and requiring min verbal cues to scan tray to locate items during eating. Pt observed to return items to R side of tray, and with decreased orientation to midline during self feeding performance. Pt observed to compensate for scanning with head turns and use of proprioception. Pt required verbal cues as well as visual cues for occulomotor movement to locate items Grooming: Wash/dry face, Moderate assistance, Standing Grooming Details (indicate cue type and reason): Pt washing face standing at sink. Pt observed to lean forward onto sink and support weight with LUE during grooming. Pt required mod A for standing balance due to R lateral lean. Pt with decreased awareness, requiring mod verbal cues for upright position (find yourself in the mirror), as well as therapist initiated rest break. Pt denying being dizzy following washing face and therapist-initiated rest break. VSS. Pt with increased awareness as indicated by adjusting faucet to achieve warmer water  to wash face. Pt with beginning emergent awareness, reporting that during standing his RLE felt like it was longer than the LLE or that something was off. Upper Body Bathing: Minimal assistance, Sitting Lower Body Bathing: Maximal assistance, Sit to/from stand Upper Body Dressing : Minimal assistance, Sitting Upper Body Dressing Details (indicate cue type and reason): Min A to don new gown Lower Body Dressing: Maximal assistance, Sit to/from stand, Sitting/lateral leans Toilet Transfer: Moderate assistance, Squat-pivot, +2 for physical assistance, BSC/3in1 Toilet Transfer Details (indicate cue type and reason): Sit<> stand transfer in preparation for additional participation in ADL. Pt requiring mod A. Toileting- Clothing Manipulation and Hygiene: Total assistance, Sit to/from stand, +2 for physical assistance, +  2 for safety/equipment Toileting - Clothing Manipulation Details (indicate cue type and reason): Pt requiring total A for posterior pericare due to requiring mod +2 support for standing balance. Pt unaware of bowel movement in bed. Functional mobility during ADLs: Moderate assistance (sit<> stand) General ADL Comments: limited by balance, coordination, vision and cognition. Pt with frequent bouts of dysmetria while reaching for items and compensation with head turns.    Mobility  Overal bed mobility: Needs Assistance Bed Mobility: Rolling, Supine to Sit Rolling: Supervision Supine to sit: Supervision Sit to supine: Supervision General bed mobility comments: HOB elevated and use of bedrails. R lateral lean, coming down onto elbow upon sitting up.    Transfers  Overall transfer level: Needs assistance Equipment used: Rolling walker (2 wheels) Transfers: Sit to/from Stand, Bed to chair/wheelchair/BSC Sit to Stand: Mod assist Bed to/from chair/wheelchair/BSC transfer type:: Stand pivot Stand pivot transfers: Mod assist Squat pivot transfers: Mod assist, Min assist, +2 physical  assistance General transfer comment: Stood from elevated EOB with RW and mod A and attempted ambulation. However pt unaware that he was not advancing RLE and began to demonstrate strong R lateral lean upon taking 2 steps with inability to correct. Therefore, for safety purposes provided max A to step 48f back to EOB where pt demosntrated posterior LOB onto bed. Pt then transferred bed<>recliner stand<>pivot to R with mod A due to poor sequencing/motor planning. Stood x 1 additional rep from recliner with RW and mod A and worked on L visual scanning/attention with min fading to max A for standing balance due to increased R lateral lean.    Ambulation / Gait / Stairs / Wheelchair Mobility  Ambulation/Gait Ambulation/Gait assistance: Max aWeb designer(Feet): 2 Feet Assistive device: Rolling walker (2 wheels) Gait Pattern/deviations: Decreased step length - right, Step-to pattern, Decreased stride length, Decreased weight shift to left, Narrow base of support General Gait Details: need +2 for ambulation    Posture / Balance Dynamic Sitting Balance Sitting balance - Comments: Pt with R lateral lean, and reliance on BUE when back is not supported in recliner Balance Overall balance assessment: Needs assistance Sitting-balance support: Feet supported, Single extremity supported, Bilateral upper extremity supported Sitting balance-Leahy Scale: Poor Sitting balance - Comments: Pt with R lateral lean, and reliance on BUE when back is not supported in recliner Postural control: Right lateral lean Standing balance support: Bilateral upper extremity supported, During functional activity, Reliant on assistive device for balance Standing balance-Leahy Scale: Poor Standing balance comment: Pt initally able to maintain standing balance with min A fading to max A with fatigue and decreased attention resulting in strong R lateral lean    Special needs/care consideration Diabetic management yes    Previous Home Environment (from acute therapy documentation) Living Arrangements: Spouse/significant other, Children  Lives With: Significant other, Other (Comment), Family (132year old son, cousin at home also) Available Help at Discharge: Friend(s), Family, Available 24 hours/day Type of Home: House Home Layout: Two level, 1/2 bath on main level, Bed/bath upstairs (could stay on 1st floor if needed) Alternate Level Stairs-Rails: Right Alternate Level Stairs-Number of Steps: 20 Home Access: Stairs to enter Entrance Stairs-Rails: Right Entrance Stairs-Number of Steps: 5 Bathroom Shower/Tub: Tub/shower unit, WMultimedia programmer SAssociate ProfessorAccessibility: Yes Home Care Services: No  Discharge Living Setting Plans for Discharge Living Setting: Patient's home, Lives with (comment) (significant other, cousin, son) Type of Home at Discharge: House Discharge Home Layout: Two level, 1/2 bath on main level, Bed/bath upstairs, Able  to live on main level with bedroom/bathroom Alternate Level Stairs-Number of Steps: flight Discharge Home Access: Stairs to enter Entrance Stairs-Rails: Right Entrance Stairs-Number of Steps: 5 Discharge Bathroom Shower/Tub: Walk-in shower Discharge Bathroom Toilet: Standard Discharge Bathroom Accessibility: Yes How Accessible: Accessible via walker Does the patient have any problems obtaining your medications?: Yes (Describe) (cost)  Social/Family/Support Systems Patient Roles: Partner, Parent Anticipated Caregiver: Significant other, Training and development officer Information: [phone] Ability/Limitations of Caregiver: can provide supervision to min A Caregiver Availability: 24/7 Discharge Plan Discussed with Primary Caregiver: Yes Is Caregiver In Agreement with Plan?: Yes Does Caregiver/Family have Issues with Lodging/Transportation while Pt is in Rehab?: No  Goals Patient/Family Goal for Rehab: supervision  level Expected length of stay: 14 days Pt/Family Agrees to Admission and willing to participate: Yes Program Orientation Provided & Reviewed with Pt/Caregiver Including Roles  & Responsibilities: Yes  Barriers to Discharge: Insurance for SNF coverage  Decrease burden of Care through IP rehab admission: OtherN/A  Possible need for SNF placement upon discharge: Not anticipated  Patient Condition: I have reviewed medical records from [facility], spoken with  [facility] , and patient and family member. I met with patient at the bedside and discussed via phone for inpatient rehabilitation assessment.  Patient will benefit from ongoing PT, OT, and SLP, can actively participate in 3 hours of therapy a day 5 days of the week, and can make measurable gains during the admission.  Patient will also benefit from the coordinated team approach during an Inpatient Acute Rehabilitation admission.  The patient will receive intensive therapy as well as Rehabilitation physician, nursing, social worker, and care management interventions.  Due to safety, skin/wound care, disease management, medication administration, and patient education the patient requires 24 hour a day rehabilitation nursing.  The patient is currently Mod A with mobility and basic ADLs.  Discharge setting and therapy post discharge at home with home health is anticipated.  Patient has agreed to participate in the Acute Inpatient Rehabilitation Program and will admit today.  Preadmission Screen Completed By:  Amanda Cockayne, PT, and Conetta,Kristyn, 09/09/2021 12:13 PM ______________________________________________________________________   Discussed status with Dr. Naaman Plummer  on 09/11/21  at 11:05 AM  and received approval for admission today.  Admission Coordinator: Amanda Cockayne, PT, and [place], [place], time 11:05 AM  Sudie Grumbling 09/11/21   Assessment/Plan: Diagnosis: Right MCA embolic infarct Does the need for close, 24 hr/day Medical supervision in  concert with the patient's rehab needs make it unreasonable for this patient to be served in a less intensive setting? Yes Co-Morbidities requiring supervision/potential complications: HTN, DM Due to bladder management, bowel management, safety, skin/wound care, disease management, medication administration, pain management, and patient education, does the patient require 24 hr/day rehab nursing? Yes Does the patient require coordinated care of a physician, rehab nurse, PT, OT, and SLP to address physical and functional deficits in the context of the above medical diagnosis(es)? Yes Addressing deficits in the following areas: balance, endurance, locomotion, strength, transferring, bowel/bladder control, bathing, dressing, feeding, grooming, toileting, cognition, speech, and psychosocial support Can the patient actively participate in an intensive therapy program of at least 3 hrs of therapy 5 days a week? Yes The potential for patient to make measurable gains while on inpatient rehab is excellent Anticipated functional outcomes upon discharge from inpatient rehab: supervision PT, supervision OT, supervision SLP Estimated rehab length of stay to reach the above functional goals is: 10-14 days Anticipated discharge destination: Home 10. Overall Rehab/Functional Prognosis: excellent   MD Signature: Alroy Dust  Alen Blew, MD, [place] Director Rehabilitation Services 09/11/2021

## 2021-09-09 NOTE — Progress Notes (Signed)
PROGRESS NOTE  Shawn Austin  DOB: 1965-07-12  PCP: Judee Clara, FNP NWG:[id number]  DOA: 09/01/2021  LOS: 7 days  Hospital Day: 9  Brief narrative: Shawn Austin is a 56 y.o. male with PMH significant for DM2, HTN, HLD 09/01/2021, patient presented to the ED from home with complaint of loss of balance, slurred speech after waking up. In the ED he was noted to have blood pressure elevated to 182/111 CT head negative for acute changes MRI brain showed acute ischemia at the dorsal lateral right medulla oblongata. CTA head/neck with variant anteriorcerebral anatomy, diminutive L anterior cerebral artery (concern possibly acutely occluded).  Intrcranial atherosclerotic disease diffusely affecting the anterior, middle, and posterior cerebral artery vessels.  Moderate stenosis of distal M1 segment on L. Atherosclerotic disease in both carotid siphon regions with stenosis approaching 50% particularly on the R Admitted to hospitalist service Neurology consulted.  Stroke work-up completed. Pending SNF at this time  Subjective: Patient was seen and examined this morning.  Not in distress.  No new symptoms. Blood pressure better, Blood sugar better. Patient was also seen by Occupational Therapy this morning.  They noted that he has stronger gaze preference towards right today.  He seems to have regressed in terms of following commands as well.  MRI brain was ordered to rule out a new stroke.  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   -Imaging findings as above  -Neurology consult appreciated. -Recommended DAPT with aspirin and Plavix for 3 weeks followed by aspirin alone starting 09/23/2021 -With the new deficits noted by OT today, plan is to do an MRI brain.  If negative, plan is to discharge to CIR today  Type 2 diabetes mellitus -A1c 8.1  on 09/02/2021 -Was supposed to be on but not taking oral antidiabetic medications at home -Currently blood sugar is better after glipizide was added on 7/22.  Continue metformin 500 mg twice daily and glipizide 2.5 mg daily. Continue sliding scale insulin with Accu-Cheks. Change to carb controlled diet. Recent Labs  Lab 09/08/21 1158 09/08/21 1559 09/08/21 2312 09/09/21 0628 09/09/21 1244  GLUCAP 240* 185* 88 99 94    Essential hypertension -Blood pressure improving now after Coreg was added on 7/22.   -Continue Coreg 3.125 mg twice daily, HCTZ 25 mg daily and losartan 50 mg daily.   -Continue to monitor blood pressure.  May need further adjustment.  Hyperlipidemia -Continue with statin therapy.   Goals of care   Code Status: Full Code    Mobility: PT eval obtained.  Pending CIR  Skin assessment:     Nutritional status:  Body mass index is 28.06 kg/m.          Diet:  Diet Order             Diet heart healthy/carb modified Room service appropriate? Yes; Fluid consistency: Thin  Diet effective now                   DVT prophylaxis:  SCDs Start: 09/02/21 0210   Antimicrobials: None Fluid: None Consultants: None Family Communication: None at bedside  Status is: Inpatient  Continue in-hospital care because: Pending MRI brain, pending placement Level of care: Med-Surg   Dispo: The patient is from: Home              Anticipated d/c is to: Unclear discharge plan  Patient currently is medically stable to d/c.   Difficult to place patient No   Infusions:    Scheduled Meds:  aspirin  81 mg Oral Daily   atorvastatin  80 mg Oral Daily   carvedilol  3.125 mg Oral BID WC   clopidogrel  75 mg Oral Daily   glipiZIDE  2.5 mg Oral QAC breakfast   hydrochlorothiazide  25 mg Oral Daily   insulin aspart  0-6 Units Subcutaneous Q6H   losartan  50 mg Oral QHS   metFORMIN  500 mg Oral BID WC    PRN meds: acetaminophen **OR** acetaminophen,  labetalol, LORazepam, ondansetron (ZOFRAN) IV, mouth rinse   Antimicrobials: Anti-infectives (From admission, onward)    None       Objective: Vitals:   09/09/21 0739 09/09/21 1240  BP: (!) 168/98 (!) 156/94  Pulse: 93 88  Resp: 17 19  Temp: 99.1 F (37.3 C) 98.4 F (36.9 C)  SpO2: 100% 99%    Intake/Output Summary (Last 24 hours) at 09/09/2021 1616 Last data filed at 09/09/2021 0655 Gross per 24 hour  Intake --  Output 400 ml  Net -400 ml    Filed Weights   09/05/21 0407 09/06/21 0324 09/07/21 0500  Weight: 82.1 kg 82.9 kg 86.2 kg   Weight change:  Body mass index is 28.06 kg/m.   Physical Exam: General exam: Pleasant middle-aged African-American male Skin: No rashes, lesions or ulcers. HEENT: Atraumatic, normocephalic, no obvious bleeding Lungs: Clear to auscultation bilaterally. CVS: Regular rate and rhythm, no murmur GI/Abd soft, nontender, nondistended, bowel sound present CNS: Alert, awake, right gaze preference Psychiatry: Mood appropriate Extremities: No pedal edema, no calf tenderness  Data Review: I have personally reviewed the laboratory data and studies available.  F/u labs ordered [employer] (From admission, onward)     Start     Ordered   Signed and Held  Comprehensive metabolic panel  Tomorrow morning,   R       Question:  Specimen collection method  Answer:  Lab=Lab collect   Signed and Held   Signed and Held  Basic metabolic panel  Weekly,   R     Question:  Specimen collection method  Answer:  Lab=Lab collect   Signed and Held   Signed and Held  CBC with Differential/Platelet  Tomorrow morning,   R       Question:  Specimen collection method  Answer:  Lab=Lab collect   Signed and Held   Signed and Held  CBC  (enoxaparin (LOVENOX)    CrCl >/= 30 ml/min)  Weekly,   R     Comments: Baseline for enoxaparin therapy IF NOT ALREADY DRAWN.  Notify MD if PLT < 100 K.   Question:  Specimen collection method  Answer:  Lab=Lab collect    Signed and Held            Signed, [name], MD [facility] 09/09/2021

## 2021-09-10 LAB — CBC
HCT: 39.2 % (ref 39.0–52.0)
Hemoglobin: 13.5 g/dL (ref 13.0–17.0)
MCH: 27.8 pg (ref 26.0–34.0)
MCHC: 34.4 g/dL (ref 30.0–36.0)
MCV: 80.8 fL (ref 80.0–100.0)
Platelets: 410 10*3/uL — ABNORMAL HIGH (ref 150–400)
RBC: 4.85 MIL/uL (ref 4.22–5.81)
RDW: 12.7 % (ref 11.5–15.5)
WBC: 6 10*3/uL (ref 4.0–10.5)
nRBC: 0 % (ref 0.0–0.2)

## 2021-09-10 LAB — COMPREHENSIVE METABOLIC PANEL
ALT: 23 U/L (ref 0–44)
AST: 24 U/L (ref 15–41)
Albumin: 3.3 g/dL — ABNORMAL LOW (ref 3.5–5.0)
Alkaline Phosphatase: 93 U/L (ref 38–126)
Anion gap: 8 (ref 5–15)
BUN: 10 mg/dL (ref 6–20)
CO2: 25 mmol/L (ref 22–32)
Calcium: 8.9 mg/dL (ref 8.9–10.3)
Chloride: 105 mmol/L (ref 98–111)
Creatinine, Ser: 1.12 mg/dL (ref 0.61–1.24)
GFR, Estimated: >60 mL/min (ref >60)
Glucose, Bld: 106 mg/dL — ABNORMAL HIGH (ref 70–99)
Potassium: 3.7 mmol/L (ref 3.5–5.1)
Sodium: 138 mmol/L (ref 135–145)
Total Bilirubin: 1 mg/dL (ref 0.3–1.2)
Total Protein: 6.5 g/dL (ref 6.5–8.1)

## 2021-09-10 LAB — GLUCOSE, CAPILLARY
Glucose-Capillary: 128 mg/dL — ABNORMAL HIGH (ref 70–99)
Glucose-Capillary: 110 mg/dL — ABNORMAL HIGH (ref 70–99)
Glucose-Capillary: 113 mg/dL — ABNORMAL HIGH (ref 70–99)
Glucose-Capillary: 136 mg/dL — ABNORMAL HIGH (ref 70–99)

## 2021-09-10 MED ORDER — ASPIRIN 325 MG PO TBEC
325.0000 mg | DELAYED_RELEASE_TABLET | Freq: Every day | ORAL | Status: DC
  Administered 2021-09-10 – 2021-09-11 (×2): 325 mg via ORAL
  Filled 2021-09-10 (×2): qty 1

## 2021-09-10 MED ORDER — INSULIN ASPART 100 UNIT/ML IJ SOLN
0.0000 [IU] | Freq: Three times a day (TID) | INTRAMUSCULAR | Status: DC
  Administered 2021-09-11: 1 [IU] via SUBCUTANEOUS

## 2021-09-10 NOTE — Progress Notes (Signed)
Inpatient Rehab Admissions Coordinator:   Note MRI showed new strokes and further workup recommended.  Will continue to follow for timing of rehab admission pending medical readiness.   Estill Dooms, PT, DPT Admissions Coordinator [phone] 09/10/21  9:11 AM

## 2021-09-10 NOTE — Progress Notes (Signed)
    [facility] has been requested to perform a transesophageal echocardiogram on 09/11/21 for stroke.  After careful review of history and examination, the risks and benefits of transesophageal echocardiogram have been explained including risks of esophageal damage, perforation (1:10,000 risk), bleeding, pharyngeal hematoma as well as other potential complications associated with conscious sedation including aspiration, arrhythmia, respiratory failure and death. Alternatives to treatment were discussed, questions were answered. Patient is willing to proceed.   Laverda Page, NP-C 09/10/2021 2:53 PM

## 2021-09-10 NOTE — Anesthesia Preprocedure Evaluation (Addendum)
Anesthesia Evaluation  Patient identified by MRN, date of birth, ID band Patient awake    Reviewed: Allergy & Precautions, NPO status , Patient's Chart, lab work & pertinent test results  History of Anesthesia Complications Negative for: history of anesthetic complications  Airway Mallampati: II  TM Distance: >3 FB Neck ROM: Full    Dental   Pulmonary neg pulmonary ROS,    Pulmonary exam normal        Cardiovascular hypertension, Pt. on medications Normal cardiovascular exam  Echo 09/03/21: EF 60-65%, no RWMA, mild LVH, g1dd, normal RVSF, no MR/MS, no AR/AS   Neuro/Psych CVA, Residual Symptoms    GI/Hepatic negative GI ROS, Neg liver ROS,   Endo/Other  diabetes, Type 2, Oral Hypoglycemic Agents  Renal/GU negative Renal ROS  negative genitourinary   Musculoskeletal negative musculoskeletal ROS (+)   Abdominal   Peds  Hematology negative hematology ROS (+)   Anesthesia Other Findings   Reproductive/Obstetrics                           Anesthesia Physical Anesthesia Plan  ASA: 4  Anesthesia Plan: MAC   Post-op Pain Management: Minimal or no pain anticipated   Induction: Intravenous  PONV Risk Score and Plan: 1 and Propofol infusion, TIVA and Treatment may vary due to age or medical condition  Airway Management Planned: Natural Airway, Nasal Cannula and Simple Face Mask  Additional Equipment: None  Intra-op Plan:   Post-operative Plan:   Informed Consent: I have reviewed the patients History and Physical, chart, labs and discussed the procedure including the risks, benefits and alternatives for the proposed anesthesia with the patient or authorized representative who has indicated his/her understanding and acceptance.       Plan Discussed with:   Anesthesia Plan Comments:        Anesthesia Quick Evaluation

## 2021-09-10 NOTE — H&P (View-Only) (Signed)
PROGRESS NOTE  Shawn Austin  DOB: 06/15/1965  PCP: Judee Clara, FNP DTO:[id number]  DOA: 09/01/2021  LOS: 8 days  Hospital Day: 10  Brief narrative: Shawn Austin is a 56 y.o. male with PMH significant for DM2, HTN, HLD 09/01/2021, patient presented to the ED from home with complaint of loss of balance, slurred speech after waking up. In the ED he was noted to have blood pressure elevated to 182/111 CT head negative for acute changes MRI brain showed acute ischemia at the dorsal lateral right medulla oblongata. CTA head/neck with variant anteriorcerebral anatomy, diminutive L anterior cerebral artery (concern possibly acutely occluded).  Intrcranial atherosclerotic disease diffusely affecting the anterior, middle, and posterior cerebral artery vessels.  Moderate stenosis of distal M1 segment on L. Atherosclerotic disease in both carotid siphon regions with stenosis approaching 50% particularly on the R Admitted to hospitalist service Neurology consulted.  Stroke work-up completed.  He was waiting for rehab. On 7/25, he was noted to have more pronounced deficits.  MRI brain was repeated which showed a few small interval/acute infarcts in the right perirolandic frontoparietal region.  Subjective: Patient was seen and examined this morning.  Not in distress.   Was being seen by physical therapy. We discussed the new MRI findings from yesterday.  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   -Imaging findings as above. -Neurology consult appreciated. -Patient had another stroke on 7/25.  MRI as above.  Discussed with Dr. Roda Shutters this morning. -Recommended to prolong the duration of DAPT with aspirin and Plavix from 3 weeks to 3 months after which, we will continue  aspirin alone -Plan for TEE tomorrow. -If negative TEE, hopefully discharge to CIR in 1 to 2 days.  Type 2 diabetes mellitus -A1c 8.1 on 09/02/2021 -Was supposed to be on but not taking oral antidiabetic medications at home -Currently blood sugar is better after glipizide was added on 7/22.  Continue metformin 500 mg twice daily and glipizide 2.5 mg daily. Continue sliding scale insulin with Accu-Cheks. Change to carb controlled diet. Recent Labs  Lab 09/09/21 1244 09/09/21 1657 09/09/21 2322 09/10/21 0607 09/10/21 1158  GLUCAP 94 80 134* 128* 110*    Essential hypertension -Blood pressure improving now after Coreg was added on 7/22.   -Continue Coreg 3.125 mg twice daily, HCTZ 25 mg daily and losartan 50 mg daily.   -Continue to monitor blood pressure.  May need further adjustment.  Hyperlipidemia -Continue with statin therapy.   Goals of care   Code Status: Full Code    Mobility: PT eval obtained.  Pending TEE tomorrow and hopefully CIR after that  Skin assessment:     Nutritional status:  Body mass index is 27.54 kg/m.          Diet:  Diet Order             Diet heart healthy/carb modified Room service appropriate? Yes; Fluid consistency: Thin  Diet effective now                   DVT prophylaxis:  SCDs Start: 09/02/21 0210   Antimicrobials: None Fluid: None Consultants: None Family Communication: None at bedside  Status is: Inpatient  Continue in-hospital care because: Pending MRI brain, pending placement Level of care: Med-Surg   Dispo: The patient is from: Home  Anticipated d/c is to: Unclear discharge plan              Patient currently is medically stable to d/c.   Difficult to place patient No   Infusions:    Scheduled Meds:  aspirin EC  325 mg Oral Daily   atorvastatin  80 mg Oral Daily   carvedilol  3.125 mg Oral BID WC   clopidogrel  75 mg Oral Daily   glipiZIDE  2.5 mg Oral QAC breakfast   hydrochlorothiazide   25 mg Oral Daily   insulin aspart  0-6 Units Subcutaneous Q6H   losartan  50 mg Oral QHS   metFORMIN  500 mg Oral BID WC    PRN meds: acetaminophen **OR** acetaminophen, labetalol, LORazepam, ondansetron (ZOFRAN) IV, mouth rinse   Antimicrobials: Anti-infectives (From admission, onward)    None       Objective: Vitals:   09/10/21 0742 09/10/21 1203  BP: (!) 142/87 114/72  Pulse: 87 87  Resp: 18 18  Temp: 97.8 F (36.6 C) 98.3 F (36.8 C)  SpO2: 98% 100%    Intake/Output Summary (Last 24 hours) at 09/10/2021 1404 Last data filed at 09/10/2021 1204 Gross per 24 hour  Intake 177 ml  Output 1950 ml  Net -1773 ml    Filed Weights   09/06/21 0324 09/07/21 0500 09/10/21 0434  Weight: 82.9 kg 86.2 kg 84.6 kg   Weight change:  Body mass index is 27.54 kg/m.   Physical Exam: General exam: Pleasant middle-aged African-American male Skin: No rashes, lesions or ulcers. HEENT: Atraumatic, normocephalic, no obvious bleeding Lungs: Clear to auscultation bilaterally. CVS: Regular rate and rhythm, no murmur GI/Abd soft, nontender, nondistended, bowel sound present CNS: Alert, awake, right gaze preference Psychiatry: Mood appropriate Extremities: No pedal edema, no calf tenderness  Data Review: I have personally reviewed the laboratory data and studies available.  F/u labs ordered Unresulted Labs (From admission, onward)     Start     Ordered   Signed and Held  Comprehensive metabolic panel  Tomorrow morning,   R       Question:  Specimen collection method  Answer:  Lab=Lab collect   Signed and Held   Signed and Held  Basic metabolic panel  Weekly,   R     Question:  Specimen collection method  Answer:  Lab=Lab collect   Signed and Held   Signed and Held  CBC with Differential/Platelet  Tomorrow morning,   R       Question:  Specimen collection method  Answer:  Lab=Lab collect   Signed and Held   Signed and Held  CBC  (enoxaparin (LOVENOX)    CrCl >/= 30 ml/min)   Weekly,   R     Comments: Baseline for enoxaparin therapy IF NOT ALREADY DRAWN.  Notify MD if PLT < 100 K.   Question:  Specimen collection method  Answer:  Lab=Lab collect   Signed and Held            Signed, Lorraine Cimmino, MD [facility] 09/10/2021             

## 2021-09-10 NOTE — Progress Notes (Signed)
Physical Therapy Treatment Patient Details Name: Shawn Austin MRN: [id number] DOB: [birth year] Today's Date: 09/10/2021   History of Present Illness 56 y.o. male with medical history significant for type 2 diabetes mellitus, hypertension, hyperlipidemia who is admitted to [facility] on 09/01/2021 with acute ischemic stroke (right dorsal lateral right medulla oblongata) after presenting from home to [facility] ED complaining of disequilibrium.    PT Comments    Received pt semi-reclined in bed but easily aroused and agreeable to PT treatment. Per MD/notes MRI from 7/25 revealed new acute strokes with impaired mobility. Pt performed bed mobility with supervision but with continued R lateral lean, actually falling over today with little awareness. Pt required x2 attempts and heavy mod A to pivot into recliner. Worked on blocked practice sit<>stands with RW x7 reps with max A fading to mod with mod multimodal cues using mirror for visual feedback. Transitioned to towel slides L/R using RUE with emphasis on anterior weight shifting and L lateral weight shifting to correct R lean - 1 large LOB requiring max/total A to safely place hips in recliner. Attempted standing mini-squats, however pt too fatigued; therefore performed seated theraex (LAQ, hip flexion, and hip adduction). Briefly worked on reciprocal scooting forward/backwards in Medical illustrator with pt demonstrating poor problem solving and sequencing. Pt continues to demonstrate poor motor planning/sequencing, L inattention with R gaze preference, mild/strong R lateral lean in sitting/standing, and decreased safety awareness/poor insight into deficits. Pt would benefit from AIR to address these impairments and facilitate independence upon discharge. Acute PT to cont to follow.     Recommendations for follow up therapy are one component of a multi-disciplinary discharge planning process, led by the attending physician.  Recommendations may be updated based  on patient status, additional functional criteria and insurance authorization.  Follow Up Recommendations  Acute inpatient rehab (3hours/day)     Assistance Recommended at Discharge Frequent or constant Supervision/Assistance  Patient can return home with the following Assistance with cooking/housework;Direct supervision/assist for medications management;Direct supervision/assist for financial management;Assist for transportation;Help with stairs or ramp for entrance;Two people to help with walking and/or transfers;A lot of help with bathing/dressing/bathroom;Assistance with feeding   Equipment Recommendations  Other (comment) (TBD)    Recommendations for Other Services Rehab consult;OT consult     Precautions / Restrictions Precautions Precautions: Fall Precaution Comments: R gaze preference, R lateral lean in sitting/standing Restrictions Weight Bearing Restrictions: No     Mobility  Bed Mobility Overal bed mobility: Needs Assistance Bed Mobility: Rolling, Supine to Sit Rolling: Supervision   Supine to sit: Supervision     General bed mobility comments: HOB elevated and use of bedrails. Pt with continued R lateral lean with one instance of tipping compleltly over to R with little awareness. Patient Response: Impulsive, Cooperative  Transfers Overall transfer level: Needs assistance Equipment used: None Transfers: Bed to chair/wheelchair/BSC Sit to Stand: Mod assist (RW) Stand pivot transfers: Mod assist (heavy mod A)         General transfer comment: Pt required x 2 attempts and heavy mod A to transfer bed<>recliner due to poor sequencing/motor planning. Pt then worked on blocked practice sit<>stands x7 with RW and max A fading to mod A with cues for hand placement - emphasis on midline orientation, L visual scanning, and L lateral weight shifting (using mirror for visual feedback).    Ambulation/Gait                   Stairs  Wheelchair  Mobility    Modified Rankin (Stroke Patients Only)       Balance Overall balance assessment: Needs assistance Sitting-balance support: Feet supported, Single extremity supported, Bilateral upper extremity supported Sitting balance-Leahy Scale: Poor Sitting balance - Comments: Pt able to maintain static sitting balance with as little as close supervision and heavy reliance on BUE support. However, demonstrated large R lateral LOB falling back into bed today. Postural control: Right lateral lean Standing balance support: Bilateral upper extremity supported, During functional activity, Reliant on assistive device for balance (RW) Standing balance-Leahy Scale: Poor Standing balance comment: Pt initally able to maintain standing balance with min A fading to max/total A with fatigue and decreased attention resulting in strong R lateral lean, almost pushing                            Cognition Arousal/Alertness: Awake/alert Behavior During Therapy: Impulsive, WFL for tasks assessed/performed Overall Cognitive Status: Impaired/Different from baseline Area of Impairment: Safety/judgement, Awareness, Problem solving, Following commands, Attention, Memory, Orientation                     Memory: Decreased short-term memory Following Commands: Follows one step commands inconsistently, Follows one step commands with increased time Safety/Judgement: Decreased awareness of safety, Decreased awareness of deficits   Problem Solving: Slow processing, Difficulty sequencing, Requires verbal cues, Requires tactile cues General Comments: Pt with continued impulsiveness and poor safety awareness. MRI results from 7/25 indicate new acute strokes and as a result pt has required increased assist on 7/25 and 7/26 with mobility - noted worsening of R lateral lean particuarly in standing. Pt remains motivated to get stronger        Exercises General Exercises - Lower Extremity Long Arc  Quad: AROM, Both, 20 reps, Seated Hip ABduction/ADduction: AROM, Both, 20 reps, Seated Hip Flexion/Marching: AROM, Both, 20 reps, Seated Heel Raises: AROM, Both, 20 reps, Seated    General Comments        Pertinent Vitals/Pain Pain Assessment Pain Assessment: No/denies pain    Home Living                          Prior Function            PT Goals (current goals can now be found in the care plan section) Acute Rehab PT Goals Patient Stated Goal: to go home PT Goal Formulation: With patient Time For Goal Achievement: 09/17/21 Potential to Achieve Goals: Fair Progress towards PT goals: Progressing toward goals    Frequency    Min 4X/week      PT Plan Current plan remains appropriate    Co-evaluation              AM-PAC PT "6 Clicks" Mobility   Outcome Measure  Help needed turning from your back to your side while in a flat bed without using bedrails?: A Little Help needed moving from lying on your back to sitting on the side of a flat bed without using bedrails?: A Little Help needed moving to and from a bed to a chair (including a wheelchair)?: A Lot Help needed standing up from a chair using your arms (e.g., wheelchair or bedside chair)?: A Lot Help needed to walk in hospital room?: Total Help needed climbing 3-5 steps with a railing? : Total 6 Click Score: 12    End of Session Equipment Utilized During Treatment: Gait belt  Activity Tolerance: Patient tolerated treatment well;Patient limited by fatigue Patient left: in chair;with call bell/phone within reach;with chair alarm set Nurse Communication: Mobility status PT Visit Diagnosis: Unsteadiness on feet (R26.81);Other abnormalities of gait and mobility (R26.89);Hemiplegia and hemiparesis;Muscle weakness (generalized) (M62.81) Hemiplegia - Right/Left: Left (both) Hemiplegia - dominant/non-dominant: Non-dominant (both) Hemiplegia - caused by: Cerebral infarction     Time: 4401-0272 PT  Time Calculation (min) (ACUTE ONLY): 38 min  Charges:  $Therapeutic Exercise: 8-22 mins $Neuromuscular Re-education: 23-37 mins                     Raechel Chute PT, DPT  Alfonso Patten 09/10/2021, 11:49 AM

## 2021-09-10 NOTE — Progress Notes (Signed)
PROGRESS NOTE  Shawn Austin  DOB: 06/15/1965  PCP: Judee Clara, FNP DTO:[id number]  DOA: 09/01/2021  LOS: 8 days  Hospital Day: 10  Brief narrative: Shawn Austin is a 56 y.o. male with PMH significant for DM2, HTN, HLD 09/01/2021, patient presented to the ED from home with complaint of loss of balance, slurred speech after waking up. In the ED he was noted to have blood pressure elevated to 182/111 CT head negative for acute changes MRI brain showed acute ischemia at the dorsal lateral right medulla oblongata. CTA head/neck with variant anteriorcerebral anatomy, diminutive L anterior cerebral artery (concern possibly acutely occluded).  Intrcranial atherosclerotic disease diffusely affecting the anterior, middle, and posterior cerebral artery vessels.  Moderate stenosis of distal M1 segment on L. Atherosclerotic disease in both carotid siphon regions with stenosis approaching 50% particularly on the R Admitted to hospitalist service Neurology consulted.  Stroke work-up completed.  He was waiting for rehab. On 7/25, he was noted to have more pronounced deficits.  MRI brain was repeated which showed a few small interval/acute infarcts in the right perirolandic frontoparietal region.  Subjective: Patient was seen and examined this morning.  Not in distress.   Was being seen by physical therapy. We discussed the new MRI findings from yesterday.  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   -Imaging findings as above. -Neurology consult appreciated. -Patient had another stroke on 7/25.  MRI as above.  Discussed with Dr. Roda Shutters this morning. -Recommended to prolong the duration of DAPT with aspirin and Plavix from 3 weeks to 3 months after which, we will continue  aspirin alone -Plan for TEE tomorrow. -If negative TEE, hopefully discharge to CIR in 1 to 2 days.  Type 2 diabetes mellitus -A1c 8.1 on 09/02/2021 -Was supposed to be on but not taking oral antidiabetic medications at home -Currently blood sugar is better after glipizide was added on 7/22.  Continue metformin 500 mg twice daily and glipizide 2.5 mg daily. Continue sliding scale insulin with Accu-Cheks. Change to carb controlled diet. Recent Labs  Lab 09/09/21 1244 09/09/21 1657 09/09/21 2322 09/10/21 0607 09/10/21 1158  GLUCAP 94 80 134* 128* 110*    Essential hypertension -Blood pressure improving now after Coreg was added on 7/22.   -Continue Coreg 3.125 mg twice daily, HCTZ 25 mg daily and losartan 50 mg daily.   -Continue to monitor blood pressure.  May need further adjustment.  Hyperlipidemia -Continue with statin therapy.   Goals of care   Code Status: Full Code    Mobility: PT eval obtained.  Pending TEE tomorrow and hopefully CIR after that  Skin assessment:     Nutritional status:  Body mass index is 27.54 kg/m.          Diet:  Diet Order             Diet heart healthy/carb modified Room service appropriate? Yes; Fluid consistency: Thin  Diet effective now                   DVT prophylaxis:  SCDs Start: 09/02/21 0210   Antimicrobials: None Fluid: None Consultants: None Family Communication: None at bedside  Status is: Inpatient  Continue in-hospital care because: Pending MRI brain, pending placement Level of care: Med-Surg   Dispo: The patient is from: Home  Anticipated d/c is to: Unclear discharge plan              Patient currently is medically stable to d/c.   Difficult to place patient No   Infusions:    Scheduled Meds:  aspirin EC  325 mg Oral Daily   atorvastatin  80 mg Oral Daily   carvedilol  3.125 mg Oral BID WC   clopidogrel  75 mg Oral Daily   glipiZIDE  2.5 mg Oral QAC breakfast   hydrochlorothiazide   25 mg Oral Daily   insulin aspart  0-6 Units Subcutaneous Q6H   losartan  50 mg Oral QHS   metFORMIN  500 mg Oral BID WC    PRN meds: acetaminophen **OR** acetaminophen, labetalol, LORazepam, ondansetron (ZOFRAN) IV, mouth rinse   Antimicrobials: Anti-infectives (From admission, onward)    None       Objective: Vitals:   09/10/21 0742 09/10/21 1203  BP: (!) 142/87 114/72  Pulse: 87 87  Resp: 18 18  Temp: 97.8 F (36.6 C) 98.3 F (36.8 C)  SpO2: 98% 100%    Intake/Output Summary (Last 24 hours) at 09/10/2021 1404 Last data filed at 09/10/2021 1204 Gross per 24 hour  Intake 177 ml  Output 1950 ml  Net -1773 ml    Filed Weights   09/06/21 0324 09/07/21 0500 09/10/21 0434  Weight: 82.9 kg 86.2 kg 84.6 kg   Weight change:  Body mass index is 27.54 kg/m.   Physical Exam: General exam: Pleasant middle-aged African-American male Skin: No rashes, lesions or ulcers. HEENT: Atraumatic, normocephalic, no obvious bleeding Lungs: Clear to auscultation bilaterally. CVS: Regular rate and rhythm, no murmur GI/Abd soft, nontender, nondistended, bowel sound present CNS: Alert, awake, right gaze preference Psychiatry: Mood appropriate Extremities: No pedal edema, no calf tenderness  Data Review: I have personally reviewed the laboratory data and studies available.  F/u labs ordered [employer] (From admission, onward)     Start     Ordered   Signed and Held  Comprehensive metabolic panel  Tomorrow morning,   R       Question:  Specimen collection method  Answer:  Lab=Lab collect   Signed and Held   Signed and Held  Basic metabolic panel  Weekly,   R     Question:  Specimen collection method  Answer:  Lab=Lab collect   Signed and Held   Signed and Held  CBC with Differential/Platelet  Tomorrow morning,   R       Question:  Specimen collection method  Answer:  Lab=Lab collect   Signed and Held   Signed and Held  CBC  (enoxaparin (LOVENOX)    CrCl >/= 30 ml/min)   Weekly,   R     Comments: Baseline for enoxaparin therapy IF NOT ALREADY DRAWN.  Notify MD if PLT < 100 K.   Question:  Specimen collection method  Answer:  Lab=Lab collect   Signed and Held            Signed, [name], MD [facility] 09/10/2021

## 2021-09-10 NOTE — Progress Notes (Signed)
STROKE TEAM PROGRESS NOTE   INTERVAL HISTORY No family at bedside.  Patient lying in bed, denies any neuro changes.  He was surprised that he had further small infarct on MRI.  No acute event overnight.  Vitals:   09/10/21 0434 09/10/21 0742 09/10/21 1203 09/10/21 1633  BP:  (!) 142/87 114/72 (!) 145/97  Pulse:  87 87 89  Resp:  18 18 18   Temp:  97.8 F (36.6 C) 98.3 F (36.8 C) 98.6 F (37 C)  TempSrc:  Oral Oral Oral  SpO2:  98% 100% 100%  Weight: 84.6 kg     Height:       CBC:  Recent Labs  Lab 09/10/21 0957  WBC 6.0  HGB 13.5  HCT 39.2  MCV 80.8  PLT 410*   Basic Metabolic Panel:  Recent Labs  Lab 09/04/21 0342 09/10/21 0957  NA 141 138  K 4.6 3.7  CL 107 105  CO2 21* 25  GLUCOSE 150* 106*  BUN 8 10  CREATININE 1.04 1.12  CALCIUM 9.4 8.9   Lipid Panel:  No results for input(s): "CHOL", "TRIG", "HDL", "CHOLHDL", "VLDL", "LDLCALC" in the last 168 hours.  HgbA1c:  No results for input(s): "HGBA1C" in the last 168 hours.  Urine Drug Screen:  No results for input(s): "LABOPIA", "COCAINSCRNUR", "LABBENZ", "AMPHETMU", "THCU", "LABBARB" in the last 168 hours.   Alcohol Level  No results for input(s): "ETH" in the last 168 hours.   IMAGING past 24 hours MR BRAIN W WO CONTRAST  Result Date: 09/09/2021 CLINICAL DATA:  Stroke, follow up worsening of stroke symptoms EXAM: MRI HEAD WITHOUT AND WITH CONTRAST TECHNIQUE: Multiplanar, multiecho pulse sequences of the brain and surrounding structures were obtained without and with intravenous contrast. CONTRAST:  63mL GADAVIST GADOBUTROL 1 MMOL/ML IV SOLN COMPARISON:  MRI head 09/01/2021. FINDINGS: Brain: A few small interval/acute infarcts in the right perirolandic frontoparietal region. Mild increase in conspicuity/size of restricted diffusion in the dorsal lateral right medulla. Edema is similar. No significant mass effect. Many old remote lacunar infarcts involving the basal ganglia and thalami with additional scattered  chronic microvascular ischemic change. No evidence of acute hemorrhage, mass lesion, midline shift, or extra-axial fluid collection. Prior hemorrhage in the basal ganglia, similar. Vascular: Major arterial flow voids are maintained at the skull base. Skull and upper cervical spine: Normal marrow signal. Sinuses/Orbits: Scattered paranasal sinus mucosal thickening with polyp versus retention cyst in the left maxillary sinus. No acute orbital findings. Other: No sizable mastoid effusions. IMPRESSION: 1. A few small interval/acute infarcts in the right perirolandic frontoparietal region. 2. Mild increase in conspicuity/size of restricted diffusion in the dorsal lateral right medulla which could represent evolution of the previously seen infarct versus slight interval expansion of infarct. Edema is similar without mass effect. 3. The remote lacunar infarcts and chronic microvascular ischemic disease. Electronically Signed   By: 09/03/2021 M.D.   On: 09/09/2021 19:48    PHYSICAL EXAM  Temp:  [97.8 F (36.6 C)-98.6 F (37 C)] 98.6 F (37 C) (07/26 1633) Pulse Rate:  [87-89] 89 (07/26 1633) Resp:  [16-18] 18 (07/26 1633) BP: (114-168)/(72-97) 145/97 (07/26 1633) SpO2:  [98 %-100 %] 100 % (07/26 1633) Weight:  [84.6 kg] 84.6 kg (07/26 0434)  General - Well nourished, well developed, in no apparent distress.  Ophthalmologic - fundi not visualized due to noncooperation.  Cardiovascular - Regular rhythm and rate.  Mental Status -  Level of arousal and orientation to time, place, and person were  intact. Language including expression, naming, repetition, comprehension was assessed and found intact.  Mild dysarthria Fund of Knowledge was assessed and was intact.  Cranial Nerves II - XII - II - Visual field intact OU. III, IV, VI - Extraocular movements showed left eye incomplete abduction. V - Facial sensation intact bilaterally. VII - left mild facial droop and mildly increase palpebral  fissure VIII - Hearing & vestibular intact bilaterally. X - Palate elevates symmetrically. XI - Chin turning & shoulder shrug intact bilaterally. XII - Tongue protrusion intact.  Motor Strength - The patient's strength was symmetrical in all extremities and pronator drift was absent.  Bulk was normal and fasciculations were absent.   Motor Tone - Muscle tone was assessed at the neck and appendages and was normal.  Reflexes - The patient's reflexes were symmetrical in all extremities and he had no pathological reflexes.  Sensory - Light touch, temperature/pinprick were assessed and were symmetrical.    Coordination - The patient had ataxia on the right FTN and HTS.  Tremor was absent.  Gait and Station - deferred.    ASSESSMENT/PLAN Shawn Austin is a 56 y.o. male with PMH significant for DM2, HTN, HLD, degenerative spinal disc disease, who presents with leaning to the left and R gaze preference and feeling off balance with falls over the last few days. Neuro exam with ataxia, left worse than right along with slurred speech. Highest suspicion for a small vessel stroke in the posterior circulation.  New stroke - right MCA several punctate infarcts, embolic pattern, concerning for cardioembolic source Some functional decline noted by PT/OT Pt denies any neuro change MRI repeat showed right MCA 3 punctate infarcts, new from last MRI TEE pending in a.m. Consider 30-day cardiac event monitoring as outpatient to rule out A-fib Avoid low BP as patient has right M2 stenosis Increase aspirin from 81 to 325, continue Plavix, DAPT for 3 months and then aspirin alone given right M2 stenosis.  Stroke - small right lateral medullary infarct likely due to small vessel disease CT Head Negative for a large hypodensity concerning for a large territory infarct or hyperdensity concerning for an ICH CT angio Head and Neck Multifocal multivessel stenosis at distal left M1, proximal right M2, right  siphon.  Also ? L ACA A1 segment occlusion  CT Perfusion: Negative MRI: Small focus of acute ischemia at the dorsal lateral right medulla oblongata.  Old left BG, right CR infarcts 2D Echo EF 60 to 65% LDL 169 HgbA1c 8.1 VTE prophylaxis -SCDs No AC/AP prior to admission, now on ASA 325mg  and Plavix 75 mg x 3 months then ASA alone given intracranial stenosis Therapy recommendations: CIR Disposition: Pending  Hypertension Stable, elevated  Avoid low BP On HCTZ 25, Coreg 3.125, Cozaar 50 Long-term BP goal normotensive  Hyperlipidemia Home meds:  none LDL 169, not at goal < 70  On atorvastatin 80mg   Continue statin at discharge  Diabetes type II Uncontrolled HgbA1c 8.1, goal < 7.0 CBGs SSI On metformin Close PCP follow up for risk factor control   Other Stroke Risk Factors   Other Active Problems DJD  Hospital day # 8  , MD PhD Stroke Neurology 09/10/2021 6:03 PM   To contact Stroke Continuity provider, please refer to [facility] Plan. After hours, contact General Neurology

## 2021-09-11 ENCOUNTER — Encounter (HOSPITAL_COMMUNITY): Payer: Self-pay | Admitting: Internal Medicine

## 2021-09-11 ENCOUNTER — Encounter (HOSPITAL_COMMUNITY): Payer: Self-pay | Admitting: Physical Medicine & Rehabilitation

## 2021-09-11 ENCOUNTER — Encounter (HOSPITAL_COMMUNITY)
Admission: EM | Disposition: A | Discharge status: Rehabilitation Facility | Payer: Self-pay | Source: Home / Self Care | Attending: Internal Medicine

## 2021-09-11 ENCOUNTER — Inpatient Hospital Stay (HOSPITAL_COMMUNITY): Payer: Medicaid Other | Admitting: Certified Registered Nurse Anesthetist

## 2021-09-11 ENCOUNTER — Inpatient Hospital Stay (HOSPITAL_COMMUNITY)
Admission: RE | Admit: 2021-09-11 | Discharge: 2021-10-10 | DRG: 057 | Disposition: A | Discharge status: Home / Self Care | Payer: Medicaid Other | Source: Intra-hospital | Attending: Physical Medicine & Rehabilitation | Admitting: Physical Medicine & Rehabilitation

## 2021-09-11 ENCOUNTER — Inpatient Hospital Stay (HOSPITAL_COMMUNITY)
Admit: 2021-09-11 | Discharge: 2021-09-11 | Disposition: A | Discharge status: Home / Self Care | Payer: Medicaid Other | Attending: Physician Assistant | Admitting: Physician Assistant

## 2021-09-11 ENCOUNTER — Inpatient Hospital Stay (HOSPITAL_COMMUNITY): Payer: Medicaid Other

## 2021-09-11 ENCOUNTER — Other Ambulatory Visit: Payer: Self-pay | Admitting: Physician Assistant

## 2021-09-11 DIAGNOSIS — D649 Anemia, unspecified: Secondary | ICD-10-CM | POA: Diagnosis not present

## 2021-09-11 DIAGNOSIS — Z7984 Long term (current) use of oral hypoglycemic drugs: Secondary | ICD-10-CM

## 2021-09-11 DIAGNOSIS — N179 Acute kidney failure, unspecified: Secondary | ICD-10-CM | POA: Diagnosis not present

## 2021-09-11 DIAGNOSIS — E785 Hyperlipidemia, unspecified: Secondary | ICD-10-CM | POA: Diagnosis present

## 2021-09-11 DIAGNOSIS — R482 Apraxia: Secondary | ICD-10-CM | POA: Diagnosis present

## 2021-09-11 DIAGNOSIS — I639 Cerebral infarction, unspecified: Secondary | ICD-10-CM

## 2021-09-11 DIAGNOSIS — E1169 Type 2 diabetes mellitus with other specified complication: Secondary | ICD-10-CM

## 2021-09-11 DIAGNOSIS — M21951 Unspecified acquired deformity of right thigh: Secondary | ICD-10-CM | POA: Diagnosis present

## 2021-09-11 DIAGNOSIS — E119 Type 2 diabetes mellitus without complications: Secondary | ICD-10-CM | POA: Diagnosis not present

## 2021-09-11 DIAGNOSIS — I63411 Cerebral infarction due to embolism of right middle cerebral artery: Secondary | ICD-10-CM

## 2021-09-11 DIAGNOSIS — R296 Repeated falls: Secondary | ICD-10-CM | POA: Diagnosis present

## 2021-09-11 DIAGNOSIS — R414 Neurologic neglect syndrome: Secondary | ICD-10-CM

## 2021-09-11 DIAGNOSIS — Q899 Congenital malformation, unspecified: Secondary | ICD-10-CM | POA: Diagnosis not present

## 2021-09-11 DIAGNOSIS — R32 Unspecified urinary incontinence: Secondary | ICD-10-CM | POA: Diagnosis not present

## 2021-09-11 DIAGNOSIS — I69354 Hemiplegia and hemiparesis following cerebral infarction affecting left non-dominant side: Principal | ICD-10-CM

## 2021-09-11 DIAGNOSIS — R339 Retention of urine, unspecified: Secondary | ICD-10-CM | POA: Diagnosis not present

## 2021-09-11 DIAGNOSIS — R270 Ataxia, unspecified: Secondary | ICD-10-CM | POA: Diagnosis present

## 2021-09-11 DIAGNOSIS — B370 Candidal stomatitis: Secondary | ICD-10-CM | POA: Diagnosis not present

## 2021-09-11 DIAGNOSIS — R7989 Other specified abnormal findings of blood chemistry: Secondary | ICD-10-CM | POA: Diagnosis not present

## 2021-09-11 DIAGNOSIS — I10 Essential (primary) hypertension: Secondary | ICD-10-CM

## 2021-09-11 DIAGNOSIS — I4891 Unspecified atrial fibrillation: Secondary | ICD-10-CM

## 2021-09-11 DIAGNOSIS — N39490 Overflow incontinence: Secondary | ICD-10-CM

## 2021-09-11 DIAGNOSIS — K5900 Constipation, unspecified: Secondary | ICD-10-CM | POA: Diagnosis not present

## 2021-09-11 DIAGNOSIS — K3184 Gastroparesis: Secondary | ICD-10-CM | POA: Diagnosis not present

## 2021-09-11 DIAGNOSIS — E669 Obesity, unspecified: Secondary | ICD-10-CM

## 2021-09-11 DIAGNOSIS — Z79899 Other long term (current) drug therapy: Secondary | ICD-10-CM | POA: Diagnosis not present

## 2021-09-11 HISTORY — PX: TEE WITHOUT CARDIOVERSION: SHX5443

## 2021-09-11 HISTORY — PX: BUBBLE STUDY: SHX6837

## 2021-09-11 LAB — CBC WITH DIFFERENTIAL/PLATELET
Abs Immature Granulocytes: 0.01 10*3/uL (ref 0.00–0.07)
Basophils Absolute: 0 10*3/uL (ref 0.0–0.1)
Basophils Relative: 1 %
Eosinophils Absolute: 0.1 10*3/uL (ref 0.0–0.5)
Eosinophils Relative: 2 %
HCT: 38.2 % — ABNORMAL LOW (ref 39.0–52.0)
Hemoglobin: 12.9 g/dL — ABNORMAL LOW (ref 13.0–17.0)
Immature Granulocytes: 0 %
Lymphocytes Relative: 34 %
Lymphs Abs: 2 10*3/uL (ref 0.7–4.0)
MCH: 28.2 pg (ref 26.0–34.0)
MCHC: 33.8 g/dL (ref 30.0–36.0)
MCV: 83.6 fL (ref 80.0–100.0)
Monocytes Absolute: 0.8 10*3/uL (ref 0.1–1.0)
Monocytes Relative: 14 %
Neutro Abs: 2.9 10*3/uL (ref 1.7–7.7)
Neutrophils Relative %: 49 %
Platelets: 420 10*3/uL — ABNORMAL HIGH (ref 150–400)
RBC: 4.57 MIL/uL (ref 4.22–5.81)
RDW: 12.9 % (ref 11.5–15.5)
WBC: 5.9 10*3/uL (ref 4.0–10.5)
nRBC: 0 % (ref 0.0–0.2)

## 2021-09-11 LAB — COMPREHENSIVE METABOLIC PANEL
ALT: 20 U/L (ref 0–44)
AST: 22 U/L (ref 15–41)
Albumin: 3.2 g/dL — ABNORMAL LOW (ref 3.5–5.0)
Alkaline Phosphatase: 88 U/L (ref 38–126)
Anion gap: 9 (ref 5–15)
BUN: 17 mg/dL (ref 6–20)
CO2: 25 mmol/L (ref 22–32)
Calcium: 8.7 mg/dL — ABNORMAL LOW (ref 8.9–10.3)
Chloride: 102 mmol/L (ref 98–111)
Creatinine, Ser: 1.29 mg/dL — ABNORMAL HIGH (ref 0.61–1.24)
GFR, Estimated: >60 mL/min (ref >60)
Glucose, Bld: 130 mg/dL — ABNORMAL HIGH (ref 70–99)
Potassium: 3.8 mmol/L (ref 3.5–5.1)
Sodium: 136 mmol/L (ref 135–145)
Total Bilirubin: 0.7 mg/dL (ref 0.3–1.2)
Total Protein: 6.7 g/dL (ref 6.5–8.1)

## 2021-09-11 LAB — GLUCOSE, CAPILLARY
Glucose-Capillary: 139 mg/dL — ABNORMAL HIGH (ref 70–99)
Glucose-Capillary: 171 mg/dL — ABNORMAL HIGH (ref 70–99)
Glucose-Capillary: 148 mg/dL — ABNORMAL HIGH (ref 70–99)
Glucose-Capillary: 167 mg/dL — ABNORMAL HIGH (ref 70–99)

## 2021-09-11 SURGERY — ECHOCARDIOGRAM, TRANSESOPHAGEAL
Anesthesia: Monitor Anesthesia Care

## 2021-09-11 MED ORDER — PROPOFOL 10 MG/ML IV BOLUS
INTRAVENOUS | Status: DC | PRN
  Administered 2021-09-11 (×2): 20 mg via INTRAVENOUS

## 2021-09-11 MED ORDER — PROPOFOL 500 MG/50ML IV EMUL
INTRAVENOUS | Status: DC | PRN
  Administered 2021-09-11: 150 ug/kg/min via INTRAVENOUS

## 2021-09-11 MED ORDER — TRAZODONE HCL 50 MG PO TABS
25.0000 mg | ORAL_TABLET | Freq: Every evening | ORAL | Status: DC | PRN
  Administered 2021-10-09: 50 mg via ORAL
  Filled 2021-09-11 (×2): qty 1

## 2021-09-11 MED ORDER — ATORVASTATIN CALCIUM 80 MG PO TABS: 80.0000 mg | ORAL_TABLET | Freq: Every day | ORAL | Status: DC

## 2021-09-11 MED ORDER — FLEET ENEMA 7-19 GM/118ML RE ENEM: 1.0000 | ENEMA | Freq: Once | RECTAL | Status: DC | PRN

## 2021-09-11 MED ORDER — HYDRALAZINE HCL 10 MG PO TABS: 10.0000 mg | ORAL_TABLET | Freq: Four times a day (QID) | ORAL | Status: DC | PRN

## 2021-09-11 MED ORDER — LOSARTAN POTASSIUM 50 MG PO TABS
50.0000 mg | ORAL_TABLET | Freq: Every day | ORAL | Status: DC
  Administered 2021-09-11 – 2021-10-08 (×26): 50 mg via ORAL
  Filled 2021-09-11 (×27): qty 1

## 2021-09-11 MED ORDER — ALUM & MAG HYDROXIDE-SIMETH 200-200-20 MG/5ML PO SUSP: 30.0000 mL | ORAL | Status: DC | PRN

## 2021-09-11 MED ORDER — ACETAMINOPHEN 325 MG PO TABS: 325.0000 mg | ORAL_TABLET | ORAL | Status: DC | PRN

## 2021-09-11 MED ORDER — INSULIN ASPART 100 UNIT/ML IJ SOLN: 0.0000 [IU] | Freq: Every day | INTRAMUSCULAR | Status: DC

## 2021-09-11 MED ORDER — GLIPIZIDE 5 MG PO TABS: 2.5000 mg | ORAL_TABLET | Freq: Every day | ORAL | Status: DC

## 2021-09-11 MED ORDER — DIPHENHYDRAMINE HCL 12.5 MG/5ML PO ELIX: 12.5000 mg | ORAL_SOLUTION | Freq: Four times a day (QID) | ORAL | Status: DC | PRN

## 2021-09-11 MED ORDER — GUAIFENESIN-DM 100-10 MG/5ML PO SYRP: 5.0000 mL | ORAL_SOLUTION | Freq: Four times a day (QID) | ORAL | Status: DC | PRN

## 2021-09-11 MED ORDER — GLIPIZIDE 5 MG PO TABS
2.5000 mg | ORAL_TABLET | Freq: Every day | ORAL | Status: DC
  Administered 2021-09-12 – 2021-09-15 (×4): 2.5 mg via ORAL
  Filled 2021-09-11 (×4): qty 0.5
  Filled 2021-09-11: qty 1
  Filled 2021-09-11: qty 0.5
  Filled 2021-09-11: qty 1

## 2021-09-11 MED ORDER — METFORMIN HCL 500 MG PO TABS: 500.0000 mg | ORAL_TABLET | Freq: Two times a day (BID) | ORAL | Status: DC

## 2021-09-11 MED ORDER — INSULIN ASPART 100 UNIT/ML IJ SOLN: 0.0000 [IU] | Freq: Three times a day (TID) | INTRAMUSCULAR | Status: DC

## 2021-09-11 MED ORDER — BLOOD PRESSURE CONTROL BOOK
Freq: Once | Status: DC
  Filled 2021-09-11: qty 1

## 2021-09-11 MED ORDER — ACETAMINOPHEN 325 MG PO TABS: 650.0000 mg | ORAL_TABLET | Freq: Four times a day (QID) | ORAL | Status: DC | PRN

## 2021-09-11 MED ORDER — ONDANSETRON HCL 4 MG/2ML IJ SOLN
4.0000 mg | Freq: Four times a day (QID) | INTRAMUSCULAR | Status: DC | PRN
  Filled 2021-09-11: qty 2

## 2021-09-11 MED ORDER — INSULIN ASPART 100 UNIT/ML IJ SOLN: 0.0000 [IU] | Freq: Three times a day (TID) | INTRAMUSCULAR | 11 refills | Status: DC

## 2021-09-11 MED ORDER — PROCHLORPERAZINE 25 MG RE SUPP: 12.5000 mg | Freq: Four times a day (QID) | RECTAL | Status: DC | PRN

## 2021-09-11 MED ORDER — CARVEDILOL 3.125 MG PO TABS
3.1250 mg | ORAL_TABLET | Freq: Two times a day (BID) | ORAL | Status: DC
  Administered 2021-09-11 – 2021-10-10 (×58): 3.125 mg via ORAL
  Filled 2021-09-11 (×58): qty 1

## 2021-09-11 MED ORDER — PROCHLORPERAZINE MALEATE 5 MG PO TABS
5.0000 mg | ORAL_TABLET | Freq: Four times a day (QID) | ORAL | Status: DC | PRN
  Administered 2021-10-01 – 2021-10-05 (×4): 10 mg via ORAL
  Filled 2021-09-11 (×5): qty 2

## 2021-09-11 MED ORDER — ASPIRIN 325 MG PO TBEC: 325.0000 mg | DELAYED_RELEASE_TABLET | Freq: Every day | ORAL | Status: DC

## 2021-09-11 MED ORDER — INSULIN ASPART 100 UNIT/ML IJ SOLN
0.0000 [IU] | Freq: Three times a day (TID) | INTRAMUSCULAR | Status: DC
  Administered 2021-09-11: 1 [IU] via SUBCUTANEOUS
  Administered 2021-09-12: 2 [IU] via SUBCUTANEOUS
  Administered 2021-09-17 – 2021-09-22 (×7): 1 [IU] via SUBCUTANEOUS
  Administered 2021-09-23: 2 [IU] via SUBCUTANEOUS
  Administered 2021-09-23 – 2021-10-01 (×6): 1 [IU] via SUBCUTANEOUS

## 2021-09-11 MED ORDER — POLYETHYLENE GLYCOL 3350 17 G PO PACK
17.0000 g | PACK | Freq: Every day | ORAL | Status: DC | PRN
  Administered 2021-09-20: 17 g via ORAL
  Filled 2021-09-11: qty 1

## 2021-09-11 MED ORDER — LORAZEPAM 1 MG PO TABS: 2.0000 mg | ORAL_TABLET | Freq: Four times a day (QID) | ORAL | Status: DC | PRN

## 2021-09-11 MED ORDER — LORAZEPAM 2 MG/ML IJ SOLN: 2.0000 mg | Freq: Four times a day (QID) | INTRAMUSCULAR | Status: DC | PRN

## 2021-09-11 MED ORDER — CLOPIDOGREL BISULFATE 75 MG PO TABS: 75.0000 mg | ORAL_TABLET | Freq: Every day | ORAL | Status: DC

## 2021-09-11 MED ORDER — ASPIRIN 81 MG PO TBEC: 81.0000 mg | DELAYED_RELEASE_TABLET | Freq: Every day | ORAL | Status: DC

## 2021-09-11 MED ORDER — METFORMIN HCL 500 MG PO TABS
500.0000 mg | ORAL_TABLET | Freq: Two times a day (BID) | ORAL | Status: DC
  Administered 2021-09-11 – 2021-10-02 (×43): 500 mg via ORAL
  Filled 2021-09-11 (×43): qty 1

## 2021-09-11 MED ORDER — PROCHLORPERAZINE EDISYLATE 10 MG/2ML IJ SOLN
5.0000 mg | Freq: Four times a day (QID) | INTRAMUSCULAR | Status: DC | PRN
  Filled 2021-09-11: qty 2

## 2021-09-11 MED ORDER — LOSARTAN POTASSIUM 50 MG PO TABS: 50.0000 mg | ORAL_TABLET | Freq: Every day | ORAL | Status: DC

## 2021-09-11 MED ORDER — SODIUM CHLORIDE 0.9 % IV SOLN
INTRAVENOUS | Status: DC
Start: 2021-09-11 — End: 2021-09-11

## 2021-09-11 MED ORDER — LIVING WELL WITH DIABETES BOOK
Freq: Once | Status: DC
Start: 2021-09-11 — End: 2021-10-10
  Filled 2021-09-11: qty 1

## 2021-09-11 MED ORDER — ORAL CARE MOUTH RINSE: 15.0000 mL | OROMUCOSAL | Status: DC | PRN

## 2021-09-11 MED ORDER — ATORVASTATIN CALCIUM 80 MG PO TABS
80.0000 mg | ORAL_TABLET | Freq: Every day | ORAL | Status: DC
  Administered 2021-09-11 – 2021-10-09 (×28): 80 mg via ORAL
  Filled 2021-09-11 (×28): qty 1

## 2021-09-11 MED ORDER — BISACODYL 10 MG RE SUPP: 10.0000 mg | Freq: Every day | RECTAL | Status: DC | PRN

## 2021-09-11 MED ORDER — CLOPIDOGREL BISULFATE 75 MG PO TABS
75.0000 mg | ORAL_TABLET | Freq: Every day | ORAL | Status: AC
  Administered 2021-09-12 – 2021-09-24 (×13): 75 mg via ORAL
  Filled 2021-09-11 (×13): qty 1

## 2021-09-11 MED ORDER — JUVEN PO PACK
1.0000 | PACK | Freq: Two times a day (BID) | ORAL | Status: DC
  Administered 2021-09-12 – 2021-09-30 (×18): 1 via ORAL
  Filled 2021-09-11 (×22): qty 1

## 2021-09-11 MED ORDER — HYDROCHLOROTHIAZIDE 25 MG PO TABS
25.0000 mg | ORAL_TABLET | Freq: Every day | ORAL | Status: DC
  Administered 2021-09-12: 25 mg via ORAL
  Filled 2021-09-11: qty 1

## 2021-09-11 MED ORDER — CARVEDILOL 3.125 MG PO TABS: 3.1250 mg | ORAL_TABLET | Freq: Two times a day (BID) | ORAL | Status: DC

## 2021-09-11 MED ORDER — HYDROCHLOROTHIAZIDE 25 MG PO TABS: 25.0000 mg | ORAL_TABLET | Freq: Every day | ORAL | Status: DC

## 2021-09-11 MED ORDER — ASPIRIN 325 MG PO TBEC
325.0000 mg | DELAYED_RELEASE_TABLET | Freq: Every day | ORAL | Status: DC
  Administered 2021-09-12 – 2021-10-10 (×29): 325 mg via ORAL
  Filled 2021-09-11 (×29): qty 1

## 2021-09-11 MED ORDER — ENOXAPARIN SODIUM 40 MG/0.4ML IJ SOSY
40.0000 mg | PREFILLED_SYRINGE | INTRAMUSCULAR | Status: DC
  Administered 2021-09-11 – 2021-10-09 (×29): 40 mg via SUBCUTANEOUS
  Filled 2021-09-11 (×29): qty 0.4

## 2021-09-11 MED ORDER — SODIUM CHLORIDE 0.9 % IV SOLN: INTRAVENOUS | Status: DC | PRN

## 2021-09-11 NOTE — Progress Notes (Signed)
  Echocardiogram Echocardiogram Transesophageal has been performed.  Augustine Radar 09/11/2021, 8:34 AM

## 2021-09-11 NOTE — CV Procedure (Signed)
TEE: Anesthesia: Propofol  Normal LV mild LVH EF 70% Trivial MR Normal AV Normal RV No ASD/PFO Negative bubble study No LAA thrombus No SOE  Charlton Haws MD [facility]

## 2021-09-11 NOTE — Transfer of Care (Signed)
Immediate Anesthesia Transfer of Care Note  Patient: MATSON WELCH  Procedure(s) Performed: TRANSESOPHAGEAL ECHOCARDIOGRAM (TEE) BUBBLE STUDY  Patient Location: Endoscopy Unit  Anesthesia Type:MAC  Level of Consciousness: awake and drowsy  Airway & Oxygen Therapy: Patient Spontanous Breathing and Patient connected to nasal cannula oxygen  Post-op Assessment: Report given to RN, Post -op Vital signs reviewed and stable and Patient moving all extremities X 4  Post vital signs: Reviewed and stable  Last Vitals:  Vitals Value Taken Time  BP 100/68   Temp    Pulse 82 09/11/21 0831  Resp 21 09/11/21 0831  SpO2 99 % 09/11/21 0831  Vitals shown include unvalidated device data.  Last Pain:  Vitals:   09/11/21 0728  TempSrc: Temporal  PainSc: 0-No pain      Patients Stated Pain Goal: 0 (37/04/88 8916)  Complications: No notable events documented.

## 2021-09-11 NOTE — Progress Notes (Signed)
Rolled pt off unit with all belongings

## 2021-09-11 NOTE — Progress Notes (Signed)
Inpatient Rehabilitation Admission Medication Review by a Pharmacist  A complete drug regimen review was completed for this patient to identify any potential clinically significant medication issues.  High Risk Drug Classes Is patient taking? Indication by Medication  Antipsychotic Yes Compazine - N/V  Anticoagulant Yes Lovenox - DVT px  Antibiotic No   Opioid No   Antiplatelet Yes ASA/plavix - CVA  Hypoglycemics/insulin Yes SSI/metformin/glipizide - DM  Vasoactive Medication Yes Losartan/hctz/coreg - HTN  Chemotherapy No   Other Yes Ator - HLD Ativan - dizziness     Type of Medication Issue Identified Description of Issue Recommendation(s)  Drug Interaction(s) (clinically significant)     Duplicate Therapy     Allergy     No Medication Administration End Date     Incorrect Dose     Additional Drug Therapy Needed     Significant med changes from prior encounter (inform family/care partners about these prior to discharge).  Stop metoprolol succinate 50 MG q24 hr from now on  Other       Clinically significant medication issues were identified that warrant physician communication and completion of prescribed/recommended actions by midnight of the next day:  No  Name of provider notified for urgent issues identified:   Provider Method of Notification:     Pharmacist comments:   Time spent performing this drug regimen review (minutes):  20  Ulyses Southward, PharmD, [place], AAHIVP, CPP Infectious Disease Pharmacist 09/11/2021 4:45 PM

## 2021-09-11 NOTE — Interval H&P Note (Signed)
History and Physical Interval Note:  09/11/2021 7:42 AM  Shawn Austin  has presented today for surgery, with the diagnosis of STROKE.  The various methods of treatment have been discussed with the patient and family. After consideration of risks, benefits and other options for treatment, the patient has consented to  Procedure(s): TRANSESOPHAGEAL ECHOCARDIOGRAM (TEE) (N/A) as a surgical intervention.  The patient's history has been reviewed, patient examined, no change in status, stable for surgery.  I have reviewed the patient's chart and labs.  Questions were answered to the patient's satisfaction.     Charlton Haws

## 2021-09-11 NOTE — Progress Notes (Signed)
Inpatient Rehab Admissions Coordinator:   I have a bed available for this patient to admit to CIR today.  Dr. Pola Corn in agreement and [facility] and pt aware.  I've left a message to confirm with his significant other.  Also referred to financial counseling for medicaid screen.   Estill Dooms, PT, DPT Admissions Coordinator [phone] 09/11/21  11:00 AM

## 2021-09-11 NOTE — Discharge Summary (Signed)
Physician Discharge Summary  Shawn Austin XIP:[id number] DOB: 10/12/1965 DOA: 09/01/2021  PCP: Judee Clara, FNP  Admit date: 09/01/2021 Discharge date: 09/11/2021  Admitted From: Home Discharge disposition: CIR  Recommendations at discharge:  Take aspirin 325 mg daily and Plavix for 3 months after which, patient will continue aspirin 325 mg alone patient needs 30-day monitor placed as an outpatient.  Brief narrative: Shawn Austin is a 56 y.o. male with PMH significant for DM2, HTN, HLD 09/01/2021, patient presented to the ED from home with complaint of loss of balance, slurred speech after waking up. In the ED he was noted to have blood pressure elevated to 182/111 CT head negative for acute changes MRI brain showed acute ischemia at the dorsal lateral right medulla oblongata. CTA head/neck with variant anteriorcerebral anatomy, diminutive L anterior cerebral artery (concern possibly acutely occluded).  Intrcranial atherosclerotic disease diffusely affecting the anterior, middle, and posterior cerebral artery vessels.  Moderate stenosis of distal M1 segment on L. Atherosclerotic disease in both carotid siphon regions with stenosis approaching 50% particularly on the R Admitted to hospitalist service Neurology consulted.  Stroke work-up completed.  He was waiting for rehab. On 7/25, he was noted to have more pronounced deficits.  MRI brain was repeated which showed a few small interval/acute infarcts in the right perirolandic frontoparietal region.  Subjective: Patient was seen and examined this morning.  Sitting up in chair.  Not in distress.  No new symptoms.  Underwent TEE this morning.  Has a bed in CIR today.  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   -Imaging findings as above. -Neurology consult appreciated. -Patient had another stroke on 7/25.  MRI as above.  Discussed with neurologist Dr. Roda Shutters . -After the first stroke, patient was recommended DAPT for 3 weeks only.  Per neurology recommendation after the second stroke, patient will continue DAPT with aspirin 325 mg daily and Plavix for 3 months after which, patient will continue aspirin 325 mg alone -7/27, underwent TEE.  Normal LV, mild LVH with EF 70%, negative bubble study, no LAA thrombus. -Plan to discharge to CIR today.  Type 2 diabetes mellitus -A1c 8.1 on 09/02/2021 -Was supposed to be on but not taking oral antidiabetic medications at home -Currently blood sugar is better after glipizide was added on 7/22.  Continue metformin 500 mg twice daily and glipizide 2.5 mg daily. Continue sliding scale insulin with Accu-Cheks.  Continue carb controlled diet. Recent Labs  Lab 09/10/21 1158 09/10/21 1638 09/10/21 2117 09/11/21 0612 09/11/21 1129  GLUCAP 110* 113* 136* 139* 171*   Essential hypertension -Blood pressure improving now after Coreg was added on 7/22.   -Continue Coreg 3.125 mg twice daily, HCTZ 25 mg daily and losartan 50 mg daily.   -Continue to monitor blood pressure.  May need further adjustment as an outpatient.  Hyperlipidemia -Continue with statin therapy.   Wounds:  -    Discharge Exam:   Vitals:   09/11/21 0850 09/11/21 0937 09/11/21 1130 09/11/21 1200  BP: 116/81 133/86 (!) 133/92   Pulse: 82 82 81   Resp: (!) 9 16 18    Temp:  97.9 F (36.6 C)  98.1 F (36.7 C)  TempSrc:  Oral    SpO2: 94% 100% 99%   Weight:      Height:  Body mass index is 27.32 kg/m.   General exam: Pleasant middle-aged African-American male Skin: No rashes, lesions or ulcers. HEENT: Atraumatic, normocephalic, no obvious bleeding Lungs: Clear to auscultation bilaterally. CVS: Regular rate and rhythm, no murmur GI/Abd soft, nontender, nondistended,  bowel sound present CNS: Alert, awake, right gaze preference Psychiatry: Mood appropriate Extremities: No pedal edema, no calf tenderness  Follow ups:    Follow-up Information     Bobbye Riggs, FNP Follow up.   Specialty: Family Medicine Contact information: [place] [phone]         [facility] Follow up.   Specialty: Neurology Contact information: [place] [phone]        [facility] Follow up.   Specialty: Cardiology Why: For 30-day event monitoring Contact information: [address] [place] [phone]                Discharge Instructions:   Discharge Instructions     Call MD for:  difficulty breathing, headache or visual disturbances   Complete by: As directed    Call MD for:  extreme fatigue   Complete by: As directed    Call MD for:  hives   Complete by: As directed    Call MD for:  persistant dizziness or light-headedness   Complete by: As directed    Call MD for:  persistant nausea and vomiting   Complete by: As directed    Call MD for:  severe uncontrolled pain   Complete by: As directed    Call MD for:  temperature >100.4   Complete by: As directed    Diet - low sodium heart healthy   Complete by: As directed    Diet Carb Modified   Complete by: As directed    Discharge instructions   Complete by: As directed    Recommendations at discharge:   Take aspirin 325 mg daily and Plavix for 3 months after which, patient will continue aspirin 325 mg alone  patient needs 30-day monitor placed as an outpatient.  Discharge instructions for diabetes mellitus: Check blood sugar 3 times a day and bedtime at home. If blood sugar running above 200 or less than 70 please call your MD to adjust insulin. If you notice signs and symptoms of hypoglycemia (low blood  sugar) like jitteriness, confusion, thirst, tremor and sweating, please check blood sugar, drink sugary drink/biscuits/sweets to increase sugar level and call MD or return to ER.    General discharge instructions: Follow with Primary MD Bobbye Riggs, FNP in 7 days  Please request your PCP  to go over your hospital tests, procedures, radiology results at the follow up. Please get your medicines reviewed and adjusted.  Your PCP may decide to repeat certain labs or tests as needed. Do not drive, operate heavy machinery, perform activities at heights, swimming or participation in water activities or provide baby sitting services if your were admitted for syncope or siezures until you have seen by Primary MD or a Neurologist and advised to do so again. [place] Controlled Substance Reporting System database was reviewed. Do not drive, operate heavy machinery, perform activities at heights, swim, participate in water activities or provide baby-sitting services while on medications for pain, sleep and mood until your outpatient physician has reevaluated you and advised to do so again.  You are strongly recommended to comply with  the dose, frequency and duration of prescribed medications. Activity: As tolerated with Full fall precautions use walker/cane & assistance as needed Avoid using any recreational substances like cigarette, tobacco, alcohol, or non-prescribed drug. If you experience worsening of your admission symptoms, develop shortness of breath, life threatening emergency, suicidal or homicidal thoughts you must seek medical attention immediately by calling 911 or calling your MD immediately  if symptoms less severe. You must read complete instructions/literature along with all the possible adverse reactions/side effects for all the medicines you take and that have been prescribed to you. Take any new medicine only after you have completely understood and accepted all the possible adverse  reactions/side effects.  Wear Seat belts while driving. You were cared for by a hospitalist during your hospital stay. If you have any questions about your discharge medications or the care you received while you were in the hospital after you are discharged, you can call the unit and ask to speak with the hospitalist or the covering physician. Once you are discharged, your primary care physician will handle any further medical issues. Please note that NO REFILLS for any discharge medications will be authorized once you are discharged, as it is imperative that you return to your primary care physician (or establish a relationship with a primary care physician if you do not have one).   Increase activity slowly   Complete by: As directed        Discharge Medications:   Allergies as of 09/11/2021   No Known Allergies      Medication List     STOP taking these medications    metoprolol succinate 50 MG 24 hr tablet Commonly known as: TOPROL-XL       TAKE these medications    acetaminophen 325 MG tablet Commonly known as: TYLENOL Take 2 tablets (650 mg total) by mouth every 6 (six) hours as needed for mild pain (or Fever >/= 101).   aspirin EC 325 MG tablet Take 1 tablet (325 mg total) by mouth daily. Take aspirin 325 mg daily and Plavix for 3 months after which, patient will continue aspirin 325 mg alone   atorvastatin 80 MG tablet Commonly known as: LIPITOR Take 1 tablet (80 mg total) by mouth daily.   carvedilol 3.125 MG tablet Commonly known as: COREG Take 1 tablet (3.125 mg total) by mouth 2 (two) times daily with a meal.   clopidogrel 75 MG tablet Commonly known as: PLAVIX Take 1 tablet (75 mg total) by mouth daily. Take aspirin 325 mg daily and Plavix for 3 months after which, patient will continue aspirin 325 mg alone   glipiZIDE 5 MG tablet Commonly known as: GLUCOTROL Take 0.5 tablets (2.5 mg total) by mouth daily before breakfast. Start taking on: September 12, 2021 What changed:  medication strength how much to take   hydrochlorothiazide 25 MG tablet Commonly known as: HYDRODIURIL Take 1 tablet (25 mg total) by mouth daily.   insulin aspart 100 UNIT/ML injection Commonly known as: novoLOG Inject 0-6 Units into the skin 4 (four) times daily -  with meals and at bedtime.   losartan 50 MG tablet Commonly known as: COZAAR Take 1 tablet (50 mg total) by mouth at bedtime.   metFORMIN 500 MG tablet Commonly known as: GLUCOPHAGE Take 1 tablet (500 mg total) by mouth 2 (two) times daily with a meal.         The results of significant diagnostics from this hospitalization (including imaging, microbiology, ancillary and laboratory) are  listed below for reference.    Procedures and Diagnostic Studies:   MR BRAIN WO CONTRAST  Result Date: 09/02/2021 CLINICAL DATA:  Facial droop EXAM: MRI HEAD WITHOUT CONTRAST TECHNIQUE: Multiplanar, multiecho pulse sequences of the brain and surrounding structures were obtained without intravenous contrast. COMPARISON:  None Available. FINDINGS: Brain: There is a small focus of abnormal diffusion restriction at the dorsal lateral right medulla oblongata. Chronic microhemorrhage in the right caudate head. There is multifocal hyperintense T2-weighted signal within the white matter. Generalized volume loss. Old small vessel infarcts of the basal ganglia. The midline structures are normal. Vascular: Major flow voids are preserved. Skull and upper cervical spine: Normal calvarium and skull base. Visualized upper cervical spine and soft tissues are normal. Sinuses/Orbits:No paranasal sinus fluid levels or advanced mucosal thickening. No mastoid or middle ear effusion. Normal orbits. IMPRESSION: 1. Small focus of acute ischemia at the dorsal lateral right medulla oblongata. No hemorrhage or mass effect. 2. Findings of chronic ischemic microangiopathy and generalized volume loss. Electronically Signed   By: Ulyses Jarred M.D.    On: 09/02/2021 00:26   CT CEREBRAL PERFUSION W CONTRAST  Result Date: 09/01/2021 CLINICAL DATA:  Right gaze.  Slurred speech. EXAM: CT PERFUSION BRAIN TECHNIQUE: Multiphase CT imaging of the brain was performed following IV bolus contrast injection. Subsequent parametric perfusion maps were calculated using RAPID software. RADIATION DOSE REDUCTION: This exam was performed according to the departmental dose-optimization program which includes automated exposure control, adjustment of the mA and/or kV according to patient size and/or use of iterative reconstruction technique. CONTRAST:  5mL OMNIPAQUE IOHEXOL 350 MG/ML SOLN COMPARISON:  CT studies earlier same day FINDINGS: CT Brain Perfusion Findings: CBF (<30%) Volume: 59mL Perfusion (Tmax>6.0s) volume: 57mL Mismatch Volume: 22mL ASPECTS on noncontrast CT Head: 10 at 1930 hours today. Infarct Core: 0 mL Infarction Location:None IMPRESSION: Negative perfusion study. This argues strongly against acute left anterior cerebral artery occlusion. Electronically Signed   By: Nelson Chimes M.D.   On: 09/01/2021 20:52   CT ANGIO HEAD NECK W WO CM (CODE STROKE)  Result Date: 09/01/2021 CLINICAL DATA:  Neuro deficit, acute, stroke suspected. Slurred speech. Right gaze deviation. EXAM: CT ANGIOGRAPHY HEAD AND NECK TECHNIQUE: Multidetector CT imaging of the head and neck was performed using the standard protocol during bolus administration of intravenous contrast. Multiplanar CT image reconstructions and MIPs were obtained to evaluate the vascular anatomy. Carotid stenosis measurements (when applicable) are obtained utilizing NASCET criteria, using the distal internal carotid diameter as the denominator. RADIATION DOSE REDUCTION: This exam was performed according to the departmental dose-optimization program which includes automated exposure control, adjustment of the mA and/or kV according to patient size and/or use of iterative reconstruction technique. CONTRAST:  43mL  OMNIPAQUE IOHEXOL 350 MG/ML SOLN COMPARISON:  Head CT earlier same day.  MRI 09/06/2020. FINDINGS: CTA NECK FINDINGS Aortic arch: Normal. No atherosclerotic change, aneurysm or dissection. Right carotid system: Common carotid artery widely patent to the bifurcation. Carotid bifurcation is normal without soft or calcified plaque. Cervical ICA is normal. Left carotid system: Similarly normal. Vertebral arteries: Both vertebral artery origins are patent. The right vertebral artery is a very small vessel. The left vertebral artery is dominant. Both vessels are patent through the cervical region to the foramen magnum. Skeleton: Ordinary cervical spondylosis. Other neck: No mass or lymphadenopathy. Upper chest: Lung apices are clear. Review of the MIP images confirms the above findings CTA HEAD FINDINGS Anterior circulation: Both internal carotid arteries are widely patent through the skull  base and siphon regions. There is siphon atherosclerotic disease, worse on the right than the left, with stenosis possibly in the range of 50%. The anterior and middle cerebral vessels are patent. There is no large vessel occlusion. There is a moderate stenosis of the distal M1 segment on the left. There is atherosclerotic narrowing and irregularity of the MCA branch vessels more diffusely on both sides. Patient may have variant anterior cerebral artery anatomy with diminutive left anterior cerebral vessel. However, I have some concern that there could be occlusion of the left anterior cerebral artery. Posterior circulation: Both vertebral arteries are patent through the foramen magnum. Both vessels reach the basilar. No basilar stenosis. PCA vessels receive most of there supply from the anterior circulation. There is considerable distal vessel atherosclerotic irregularity. Venous sinuses: Patent and normal. Anatomic variants: None significant. Review of the MIP images confirms the above findings IMPRESSION: No significant disease in  the neck. There appears to be variant anterior cerebral anatomy, with a diminutive left anterior cerebral artery. However, I do have concern that the left anterior cerebral artery could be acutely occluded. Intracranial atherosclerotic disease diffusely affecting the anterior, middle and posterior cerebral artery vessels. This includes a moderate stenosis of the distal M1 segment on the left. See above discussion about the potential for left anterior cerebral artery occlusion. Atherosclerotic disease in both carotid siphon regions with stenosis approaching 50%, particularly on the right. Findings discussed with Dr. Lorrin Goodell at 2010 hours. Electronically Signed   By: Nelson Chimes M.D.   On: 09/01/2021 20:23   CT HEAD CODE STROKE WO CONTRAST`  Result Date: 09/01/2021 CLINICAL DATA:  Code stroke.  Right gaze and slurred speech EXAM: CT HEAD WITHOUT CONTRAST TECHNIQUE: Contiguous axial images were obtained from the base of the skull through the vertex without intravenous contrast. RADIATION DOSE REDUCTION: This exam was performed according to the departmental dose-optimization program which includes automated exposure control, adjustment of the mA and/or kV according to patient size and/or use of iterative reconstruction technique. COMPARISON:  None Available. FINDINGS: Brain: There is no mass, hemorrhage or extra-axial collection. The size and configuration of the ventricles and extra-axial CSF spaces are normal. 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 Vascular: No abnormal hyperdensity of the major intracranial arteries or dural venous sinuses. No intracranial atherosclerosis. Skull: The visualized skull base, calvarium and extracranial soft tissues are normal. Sinuses/Orbits: No fluid levels or advanced mucosal thickening of the visualized paranasal sinuses. No mastoid or middle ear effusion. The orbits are normal. ASPECTS [facility] Stroke Program Early CT Score) - Ganglionic level infarction  (caudate, lentiform nuclei, internal capsule, insula, M1-M3 cortex): 7 - Supraganglionic infarction (M4-M6 cortex): 3 Total score (0-10 with 10 being normal): 10 IMPRESSION: 1. No acute intracranial abnormality. 2. ASPECTS is 10. These results were communicated to Dr. Donnetta Simpers at 7:37 pm on 09/01/2021 by text page via the [facility] messaging system. Electronically Signed   By: Ulyses Jarred M.D.   On: 09/01/2021 19:43     Labs:   Basic Metabolic Panel: Recent Labs  Lab 09/10/21 0957  NA 138  K 3.7  CL 105  CO2 25  GLUCOSE 106*  BUN 10  CREATININE 1.12  CALCIUM 8.9   GFR Estimated Creatinine Clearance: 73.6 mL/min (by C-G formula based on SCr of 1.12 mg/dL). Liver Function Tests: Recent Labs  Lab 09/10/21 0957  AST 24  ALT 23  ALKPHOS 93  BILITOT 1.0  PROT 6.5  ALBUMIN 3.3*   No results  for input(s): "LIPASE", "AMYLASE" in the last 168 hours. No results for input(s): "AMMONIA" in the last 168 hours. Coagulation profile No results for input(s): "INR", "PROTIME" in the last 168 hours.  CBC: Recent Labs  Lab 09/10/21 0957  WBC 6.0  HGB 13.5  HCT 39.2  MCV 80.8  PLT 410*   Cardiac Enzymes: No results for input(s): "CKTOTAL", "CKMB", "CKMBINDEX", "TROPONINI" in the last 168 hours. BNP: Invalid input(s): "POCBNP" CBG: Recent Labs  Lab 09/10/21 1158 09/10/21 1638 09/10/21 2117 09/11/21 0612 09/11/21 1129  GLUCAP 110* 113* 136* 139* 171*   D-Dimer No results for input(s): "DDIMER" in the last 72 hours. Hgb A1c No results for input(s): "HGBA1C" in the last 72 hours. Lipid Profile No results for input(s): "CHOL", "HDL", "LDLCALC", "TRIG", "CHOLHDL", "LDLDIRECT" in the last 72 hours. Thyroid function studies No results for input(s): "TSH", "T4TOTAL", "T3FREE", "THYROIDAB" in the last 72 hours.  Invalid input(s): "FREET3" Anemia work up No results for input(s): "VITAMINB12", "FOLATE", "FERRITIN", "TIBC", "IRON", "RETICCTPCT" in the last 72  hours. Microbiology Recent Results (from the past 240 hour(s))  Resp Panel by RT-PCR (Flu A&B, Covid) Anterior Nasal Swab     Status: None   Collection Time: 09/01/21  9:24 PM   Specimen: Anterior Nasal Swab  Result Value Ref Range Status   SARS Coronavirus 2 by RT PCR NEGATIVE NEGATIVE Final    Comment: (NOTE) SARS-CoV-2 target nucleic acids are NOT DETECTED.  The SARS-CoV-2 RNA is generally detectable in upper respiratory specimens during the acute phase of infection. The lowest concentration of SARS-CoV-2 viral copies this assay can detect is 138 copies/mL. A negative result does not preclude SARS-Cov-2 infection and should not be used as the sole basis for treatment or other patient management decisions. A negative result may occur with  improper specimen collection/handling, submission of specimen other than nasopharyngeal swab, presence of viral mutation(s) within the areas targeted by this assay, and inadequate number of viral copies(<138 copies/mL). A negative result must be combined with clinical observations, patient history, and epidemiological information. The expected result is Negative.  Fact Sheet for Patients:  EntrepreneurPulse.com.au  Fact Sheet for Healthcare Providers:  IncredibleEmployment.be  This test is no t yet approved or cleared by the [place] FDA and  has been authorized for detection and/or diagnosis of SARS-CoV-2 by FDA under an Emergency Use Authorization (EUA). This EUA will remain  in effect (meaning this test can be used) for the duration of the COVID-19 declaration under Section 564(b)(1) of the Act, 21 U.S.C.section 360bbb-3(b)(1), unless the authorization is terminated  or revoked sooner.       Influenza A by PCR NEGATIVE NEGATIVE Final   Influenza B by PCR NEGATIVE NEGATIVE Final    Comment: (NOTE) The Xpert Xpress SARS-CoV-2/FLU/RSV plus assay is intended as an aid in the diagnosis of influenza  from Nasopharyngeal swab specimens and should not be used as a sole basis for treatment. Nasal washings and aspirates are unacceptable for Xpert Xpress SARS-CoV-2/FLU/RSV testing.  Fact Sheet for Patients: EntrepreneurPulse.com.au  Fact Sheet for Healthcare Providers: IncredibleEmployment.be  This test is not yet approved or cleared by the [place] FDA and has been authorized for detection and/or diagnosis of SARS-CoV-2 by FDA under an Emergency Use Authorization (EUA). This EUA will remain in effect (meaning this test can be used) for the duration of the COVID-19 declaration under Section 564(b)(1) of the Act, 21 U.S.C. section 360bbb-3(b)(1), unless the authorization is terminated or revoked.  Performed at [facility]  Lab, [address], [place], [place] [address]     Time coordinating discharge: 35 minutes  Signed: Alsie Younes  [facility] 09/11/2021, 2:33 PM

## 2021-09-11 NOTE — Anesthesia Procedure Notes (Signed)
Procedure Name: MAC Date/Time: 09/11/2021 8:10 AM  Performed by: Merryl Hacker, RNPre-anesthesia Checklist: Patient identified, Emergency Drugs available, Suction available and Patient being monitored Patient Re-evaluated:Patient Re-evaluated prior to induction Oxygen Delivery Method: Nasal cannula Preoxygenation: Pre-oxygenation with 100% oxygen Induction Type: IV induction Placement Confirmation: positive ETCO2 and breath sounds checked- equal and bilateral Dental Injury: Teeth and Oropharynx as per pre-operative assessment

## 2021-09-11 NOTE — Anesthesia Postprocedure Evaluation (Signed)
Anesthesia Post Note  Patient: Shawn Austin  Procedure(s) Performed: TRANSESOPHAGEAL ECHOCARDIOGRAM (TEE) BUBBLE STUDY     Patient location during evaluation: Endoscopy Anesthesia Type: MAC Level of consciousness: awake and alert Pain management: pain level controlled Vital Signs Assessment: post-procedure vital signs reviewed and stable Respiratory status: spontaneous breathing, nonlabored ventilation and respiratory function stable Cardiovascular status: blood pressure returned to baseline and stable Postop Assessment: no apparent nausea or vomiting Anesthetic complications: no   No notable events documented.  Last Vitals:  Vitals:   09/11/21 0937 09/11/21 1130  BP: 133/86 (!) 133/92  Pulse: 82 81  Resp: 16 18  Temp: 36.6 C   SpO2: 100% 99%    Last Pain:  Vitals:   09/11/21 0937  TempSrc: Oral  PainSc:                  Lidia Collum

## 2021-09-11 NOTE — Progress Notes (Signed)
PMR Admission Coordinator Pre-Admission Assessment   Patient: Shawn Austin is an 56 y.o., male MRN: [id number] DOB: 03-Sep-1965 Height: _0  (175.3 cm) Weight: 86.2 kg   Insurance Information HMO:     PPO:      PCP:      IPA:      80/20:      OTHER:  PRIMARY: Uninsured      Policy#:       Subscriber:  CM Name:       Phone#:      Fax#:  Pre-Cert#:       Employer:  Benefits:  Phone #:      Name:  Eff. Date:      Deduct:       Out of Pocket Max:       Life Max:  CIR:       SNF:  Outpatient:      Co-Pay:  Home Health:       Co-Pay:  DME:      Co-Pay:  Providers:  SECONDARY:       Policy#:      Phone#:    Development worker, community:       Phone#:    The Engineer, petroleum" for patients in Inpatient Rehabilitation Facilities with attached "Privacy Act [place] Records" was provided and verbally reviewed with: N/A   Emergency Contact Information Contact Information       Name Relation Home Work Mobile    [place] Father [phone]        JEFFERIES,KATINA Significant other     [phone]           Current Medical History  Patient Admitting Diagnosis: CVA History of Present Illness: Patient is a 56 year old male admitted to [facility] on [facility]/21 with slurred speech, disequilibrium, and elevated BP on admission 182/111. PMH includes Type 2 DM, A1C 8.1 on 09/02/21, HTN, HLD. MRI brain showed small focus of acute ischemia at the right dorsal lateral right medulla oblongata without associated hemorrhage or mass effect. Labs were notable for the following: CMP, which showed creatinine 0.97, glucose 198. Serum ethanol level less than 10.  Urinary drug screen pan negative. EKG showed sinus rhythm with right bundle branch block, heart rate 84, and no evidence of T wave or ST changes, including no evidence of ST elevation.  Neurology recommended DAPT with aspirin and Plavix for 3 weeks followed by aspirin alone starting 09/23/2021. 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  Stroke team engaged and recommended TEE, which was non-revealing.  Recommendations are for 30 day event monitor and DAPT asa and plavix x3 months, then asa alone.  PT, OT, SLP consulted and recommending CIR.     Complete NIHSS TOTAL: 4   Patient's medical record from Shawn Austin has been reviewed by the rehabilitation admission coordinator and physician.   Past Medical History      Past Medical History:  Diagnosis Date   Anemia     Articular cartilage disorder of hip     Cerebral ischemia     Congenital deformity of right hip joint     Degeneration of cervical intervertebral disc     Diabetes mellitus without complication (HCC)      type 2   Fatigue     Hyperglycemia due to diabetes mellitus (HCC)     Hyperlipidemia     Hypertension  Hypocalcemia     Hyponatremia     Lower limb pain, inferior, right     Mild cognitive disorder     Pain in joint of left knee     Recurrent falls     Spondylolisthesis of lumbar region     Spondylolisthesis, lumbar region        Has the patient had major surgery during 100 days prior to admission? No   Family History   family history is not on file.   Current Medications   Current Facility-Administered Medications:    acetaminophen (TYLENOL) tablet 650 mg, 650 mg, Oral, Q6H PRN, 650 mg at 09/07/21 2211 **OR** acetaminophen (TYLENOL) suppository 650 mg, 650 mg, Rectal, Q6H PRN, Howerter, Justin B, DO   aspirin chewable tablet 81 mg, 81 mg, Oral, Daily, Bailey-Modzik, Delila A, NP, 81 mg at 09/09/21 0833   atorvastatin (LIPITOR) tablet 80 mg, 80 mg, Oral, Daily, Bailey-Modzik, Delila A, NP, 80 mg at 09/08/21 2103   carvedilol (COREG) tablet 3.125 mg, 3.125 mg, Oral, BID WC, Dahal, Binaya, MD, 3.125 mg at 09/09/21 2297   clopidogrel (PLAVIX) tablet 75 mg, 75 mg, Oral, Daily, Reome, Earle J, RPH, 75 mg at 09/09/21 9892   glipiZIDE  (GLUCOTROL) tablet 2.5 mg, 2.5 mg, Oral, QAC breakfast, Dahal, Binaya, MD, 2.5 mg at 09/09/21 1194   hydrochlorothiazide (HYDRODIURIL) tablet 25 mg, 25 mg, Oral, Daily, Arrien, Jimmy Picket, MD, 25 mg at 09/09/21 0833   insulin aspart (novoLOG) injection 0-6 Units, 0-6 Units, Subcutaneous, Q6H, Howerter, Justin B, DO, 1 Units at 09/08/21 1610   labetalol (NORMODYNE) injection 10 mg, 10 mg, Intravenous, Q2H PRN, [name]., MD, 10 mg at 09/06/21 0328   LORazepam (ATIVAN) injection 0.5 mg, 0.5 mg, Intravenous, Q6H PRN, [name]., MD, 0.5 mg at 09/02/21 1512   losartan (COZAAR) tablet 50 mg, 50 mg, Oral, QHS, Dahal, Binaya, MD   metFORMIN (GLUCOPHAGE) tablet 500 mg, 500 mg, Oral, BID WC, Arrien, Jimmy Picket, MD, 500 mg at 09/09/21 0833   ondansetron [facility]) injection 4 mg, 4 mg, Intravenous, Q6H PRN, Dahal, Binaya, MD, 4 mg at 09/07/21 1740   Oral care mouth rinse, 15 mL, Mouth Rinse, PRN, Howerter, Justin B, DO   Patients Current Diet:  Diet Order                  Diet heart healthy/carb modified Room service appropriate? Yes; Fluid consistency: Thin  Diet effective now                         Precautions / Restrictions Precautions Precautions: Fall Precaution Comments: R gaze preference, R lateral lean in sitting/standing Restrictions Weight Bearing Restrictions: No    Has the patient had 2 or more falls or a fall with injury in the past year? Yes   Prior Activity Level Community (5-7x/wk): was using can occasionally, driving his son to camp, grocery shopping prior   Prior Functional Level Self Care: Did the patient need help bathing, dressing, using the toilet or eating? Independent   Indoor Mobility: Did the patient need assistance with walking from room to room (with or without device)? Independent   Stairs: Did the patient need assistance with internal or external stairs (with or without device)? Independent   Functional Cognition: Did the  patient need help planning regular tasks such as shopping or remembering to take medications? Independent   Patient Information Are you of Hispanic, Latino/a,or Spanish origin?: A.  No, not of Hispanic, Latino/a, or Spanish origin What is your race?: B. Black or African American Do you need or want an interpreter to communicate with a doctor or health care staff?: 0. No   Patient's Response To:  Health Literacy and Transportation Is the patient able to respond to health literacy and transportation needs?: No Health Literacy - How often do you need to have someone help you when you read instructions, pamphlets, or other written material from your doctor or pharmacy?: Patient unable to respond In the past 12 months, has lack of transportation kept you from medical appointments or from getting medications?: No In the past 12 months, has lack of transportation kept you from meetings, work, or from getting things needed for daily living?: No   Home Assistive Devices / [place] Devices/Equipment: Radio producer (specify quad or straight), Blood pressure cuff, Eyeglasses, Crutches Home Equipment: None   Prior Device Use: Indicate devices/aids used by the patient prior to current illness, exacerbation or injury? None of the above   Current Functional Level Cognition   Arousal/Alertness: Awake/alert Overall Cognitive Status: Impaired/Different from baseline Current Attention Level: Sustained Orientation Level: Oriented X4 Following Commands: Follows one step commands inconsistently Safety/Judgement: Decreased awareness of safety, Decreased awareness of deficits General Comments: pt impulsive and with very poor safety awareness telling therapist he is ready to go home and is able to walk on his own. Attention: Sustained Sustained Attention: Impaired Sustained Attention Impairment: Verbal basic Memory: Impaired Memory Impairment: Storage deficit, Decreased recall of new  information Awareness: Impaired Awareness Impairment: Emergent impairment, Anticipatory impairment Problem Solving: Impaired Problem Solving Impairment: Functional basic Safety/Judgment: Impaired    Extremity Assessment (includes Sensation/Coordination)   Upper Extremity Assessment: RUE deficits/detail, LUE deficits/detail RUE Deficits / Details: strength WFL, decreased GMC with finger to nose testing RUE Sensation: WNL RUE Coordination: decreased gross motor LUE Deficits / Details: strength WFL, decreased fine and gross motor coordination with testing LUE Sensation: WNL LUE Coordination: decreased gross motor, decreased fine motor  Lower Extremity Assessment: Defer to PT evaluation LLE Deficits / Details: strength WFL; decr coordination/ataxia LLE Sensation:  (?decr proprioception; denies numbness) LLE Coordination: decreased gross motor, decreased fine motor     ADLs   Overall ADL's : Needs assistance/impaired Eating/Feeding: Sitting, Minimal assistance Eating/Feeding Details (indicate cue type and reason): Pt eating on arrival and requiring min verbal cues to scan tray to locate items during eating. Pt observed to return items to R side of tray, and with decreased orientation to midline during self feeding performance. Pt observed to compensate for scanning with head turns and use of proprioception. Pt required verbal cues as well as visual cues for occulomotor movement to locate items Grooming: Wash/dry face, Moderate assistance, Standing Grooming Details (indicate cue type and reason): Pt washing face standing at sink. Pt observed to lean forward onto sink and support weight with LUE during grooming. Pt required mod A for standing balance due to R lateral lean. Pt with decreased awareness, requiring mod verbal cues for upright position (find yourself in the mirror), as well as therapist initiated rest break. Pt denying being dizzy following washing face and therapist-initiated rest  break. VSS. Pt with increased awareness as indicated by adjusting faucet to achieve warmer water to wash face. Pt with beginning emergent awareness, reporting that during standing his RLE felt like it was longer than the LLE or that something was off. Upper Body Bathing: Minimal assistance, Sitting Lower Body Bathing: Maximal assistance, Sit to/from stand Upper Body  Dressing : Minimal assistance, Sitting Upper Body Dressing Details (indicate cue type and reason): Min A to don new gown Lower Body Dressing: Maximal assistance, Sit to/from stand, Sitting/lateral leans Toilet Transfer: Moderate assistance, Squat-pivot, +2 for physical assistance, BSC/3in1 Toilet Transfer Details (indicate cue type and reason): Sit<> stand transfer in preparation for additional participation in ADL. Pt requiring mod A. Toileting- Clothing Manipulation and Hygiene: Total assistance, Sit to/from stand, +2 for physical assistance, +2 for safety/equipment Toileting - Clothing Manipulation Details (indicate cue type and reason): Pt requiring total A for posterior pericare due to requiring mod +2 support for standing balance. Pt unaware of bowel movement in bed. Functional mobility during ADLs: Moderate assistance (sit<> stand) General ADL Comments: limited by balance, coordination, vision and cognition. Pt with frequent bouts of dysmetria while reaching for items and compensation with head turns.     Mobility   Overal bed mobility: Needs Assistance Bed Mobility: Rolling, Supine to Sit Rolling: Supervision Supine to sit: Supervision Sit to supine: Supervision General bed mobility comments: HOB elevated and use of bedrails. R lateral lean, coming down onto elbow upon sitting up.     Transfers   Overall transfer level: Needs assistance Equipment used: Rolling walker (2 wheels) Transfers: Sit to/from Stand, Bed to chair/wheelchair/BSC Sit to Stand: Mod assist Bed to/from chair/wheelchair/BSC transfer type:: Stand  pivot Stand pivot transfers: Mod assist Squat pivot transfers: Mod assist, Min assist, +2 physical assistance General transfer comment: Stood from elevated EOB with RW and mod A and attempted ambulation. However pt unaware that he was not advancing RLE and began to demonstrate strong R lateral lean upon taking 2 steps with inability to correct. Therefore, for safety purposes provided max A to step 17f back to EOB where pt demosntrated posterior LOB onto bed. Pt then transferred bed<>recliner stand<>pivot to R with mod A due to poor sequencing/motor planning. Stood x 1 additional rep from recliner with RW and mod A and worked on L visual scanning/attention with min fading to max A for standing balance due to increased R lateral lean.     Ambulation / Gait / Stairs / Wheelchair Mobility   Ambulation/Gait Ambulation/Gait assistance: Max aWeb designer(Feet): 2 Feet Assistive device: Rolling walker (2 wheels) Gait Pattern/deviations: Decreased step length - right, Step-to pattern, Decreased stride length, Decreased weight shift to left, Narrow base of support General Gait Details: need +2 for ambulation     Posture / Balance Dynamic Sitting Balance Sitting balance - Comments: Pt with R lateral lean, and reliance on BUE when back is not supported in recliner Balance Overall balance assessment: Needs assistance Sitting-balance support: Feet supported, Single extremity supported, Bilateral upper extremity supported Sitting balance-Leahy Scale: Poor Sitting balance - Comments: Pt with R lateral lean, and reliance on BUE when back is not supported in recliner Postural control: Right lateral lean Standing balance support: Bilateral upper extremity supported, During functional activity, Reliant on assistive device for balance Standing balance-Leahy Scale: Poor Standing balance comment: Pt initally able to maintain standing balance with min A fading to max A with fatigue and decreased attention  resulting in strong R lateral lean     Special needs/care consideration Diabetic management yes    Previous Home Environment (from acute therapy documentation) Living Arrangements: Spouse/significant other, Children  Lives With: Significant other, Other (Comment), Family (173year old son, cousin at home also) Available Help at Discharge: Friend(s), Family, Available 24 hours/day Type of Home: House Home Layout: Two level, 1/2 bath on main level,  Bed/bath upstairs (could stay on 1st floor if needed) Alternate Level Stairs-Rails: Right Alternate Level Stairs-Number of Steps: 20 Home Access: Stairs to enter Entrance Stairs-Rails: Right Entrance Stairs-Number of Steps: 5 Bathroom Shower/Tub: Tub/shower unit, Multimedia programmer: Programmer, systems: Yes Home Care Services: No   Discharge Living Setting Plans for Discharge Living Setting: Patient's home, Lives with (comment) (significant other, cousin, son) Type of Home at Discharge: House Discharge Home Layout: Two level, 1/2 bath on main level, Bed/bath upstairs, Able to live on main level with bedroom/bathroom Alternate Level Stairs-Number of Steps: flight Discharge Home Access: Stairs to enter Entrance Stairs-Rails: Right Entrance Stairs-Number of Steps: 5 Discharge Bathroom Shower/Tub: Walk-in shower Discharge Bathroom Toilet: Standard Discharge Bathroom Accessibility: Yes How Accessible: Accessible via walker Does the patient have any problems obtaining your medications?: Yes (Describe) (cost)   Social/Family/Support Systems Patient Roles: Partner, Parent Anticipated Caregiver: Significant other, Training and development officer Information: [phone] Ability/Limitations of Caregiver: can provide supervision to min A Caregiver Availability: 24/7 Discharge Plan Discussed with Primary Caregiver: Yes Is Caregiver In Agreement with Plan?: Yes Does Caregiver/Family have Issues with  Lodging/Transportation while Pt is in Rehab?: No   Goals Patient/Family Goal for Rehab: supervision level Expected length of stay: 14 days Pt/Family Agrees to Admission and willing to participate: Yes Program Orientation Provided & Reviewed with Pt/Caregiver Including Roles  & Responsibilities: Yes  Barriers to Discharge: Insurance for SNF coverage   Decrease burden of Care through IP rehab admission: OtherN/A   Possible need for SNF placement upon discharge: Not anticipated   Patient Condition: I have reviewed medical records from [facility], spoken with  [facility] , and patient and family member. I met with patient at the bedside and discussed via phone for inpatient rehabilitation assessment.  Patient will benefit from ongoing PT, OT, and SLP, can actively participate in 3 hours of therapy a day 5 days of the week, and can make measurable gains during the admission.  Patient will also benefit from the coordinated team approach during an Inpatient Acute Rehabilitation admission.  The patient will receive intensive therapy as well as Rehabilitation physician, nursing, social worker, and care management interventions.  Due to safety, skin/wound care, disease management, medication administration, and patient education the patient requires 24 hour a day rehabilitation nursing.  The patient is currently Mod A with mobility and basic ADLs.  Discharge setting and therapy post discharge at home with home health is anticipated.  Patient has agreed to participate in the Acute Inpatient Rehabilitation Program and will admit today.   Preadmission Screen Completed By:  Amanda Cockayne, PT, and Conetta,Kristyn, 09/09/2021 12:13 PM ______________________________________________________________________   Discussed status with Dr. Naaman Plummer  on 09/11/21  at 11:05 AM  and received approval for admission today.   Admission Coordinator: Amanda Cockayne, PT, and Benson, [place], time 11:05 AM  Sudie Grumbling 09/11/21     Assessment/Plan: Diagnosis: Right MCA embolic infarct Does the need for close, 24 hr/day Medical supervision in concert with the patient's rehab needs make it unreasonable for this patient to be served in a less intensive setting? Yes Co-Morbidities requiring supervision/potential complications: HTN, DM Due to bladder management, bowel management, safety, skin/wound care, disease management, medication administration, pain management, and patient education, does the patient require 24 hr/day rehab nursing? Yes Does the patient require coordinated care of a physician, rehab nurse, PT, OT, and SLP to address physical and functional deficits in the context of the above medical diagnosis(es)? Yes Addressing deficits in the following areas: balance,  endurance, locomotion, strength, transferring, bowel/bladder control, bathing, dressing, feeding, grooming, toileting, cognition, speech, and psychosocial support Can the patient actively participate in an intensive therapy program of at least 3 hrs of therapy 5 days a week? Yes The potential for patient to make measurable gains while on inpatient rehab is excellent Anticipated functional outcomes upon discharge from inpatient rehab: supervision PT, supervision OT, supervision SLP Estimated rehab length of stay to reach the above functional goals is: 10-14 days Anticipated discharge destination: Home 10. Overall Rehab/Functional Prognosis: excellent     MD Signature: Meredith Staggers, MD, [place] Director Rehabilitation Services 09/11/2021

## 2021-09-11 NOTE — TOC CAGE-AID Note (Signed)
Transition of Care [facility]) - CAGE-AID Screening   Patient Details  Name: Shawn Austin MRN: [id number] Date of Birth: 1965-03-21  Transition of Care [facility]) CM/SW Contact:    Coralee Pesa, [place] Phone Number: 09/11/2021, 1:35 PM   Clinical Narrative: CSW met with pt at bedside to complete CAGE- AID assessment. Pt denies any drug or alcohol use, no resources needed.   CAGE-AID Screening:    Have You Ever Felt You Ought to Cut Down on Your Drinking or Drug Use?: No Have People Annoyed You By Critizing Your Drinking Or Drug Use?: No Have You Felt Bad Or Guilty About Your Drinking Or Drug Use?: No Have You Ever Had a Drink or Used Drugs First Thing In The Morning to Steady Your Nerves or to Get Rid of a Hangover?: No CAGE-AID Score: 0  Substance Abuse Education Offered: No

## 2021-09-11 NOTE — H&P (Addendum)
Physical Medicine and Rehabilitation Admission H&P        Chief Complaint  Patient presents with   Stroke with functional deficits.       HPI:  Shawn Austin. Kizewski is a 56 year old RH-male with history of HTN, T2DM, DDD who was admitted on 09/01/21 with reports of leaning to left with falls, slurring of speech as well as eye deviation to right for a few days. CTA head/neck showed diminutive L-ACA with concerns of occlusion, moderate stenosis of distal left M1 and proximal right M2 segment . MRI brain done showing small acute ischemia in dorsal lateral right medulla oblongata with chronic ischemic microangiopathy. Stroke felt to be due to small vessel disease in PCA and Dr.Sethi recommended DAPT X 3 weeks followed by ASA alone. He has had issues with elevated BP as well as poorly controlled BS. D3, thins recommended due to slow mastication with mild residue.     While awaiting rehab bed, patient developed perseveration with apraxia LUE, worsening of balance as well as visual deficits on 07/25. MRI brain repeated and showed few new interval/acute infarcts in right perirolandic frontoparietal region with mild increase in size of right medullary infract and no change in edema. Dr. Erlinda Hong felt that new R-MCA stroke concerning for cardioembolic pattern. TEE done to rule out embolic source and showed LVEF 60-65% with moderate  LVH, mild dilation of LA without thrombus, effusion or IA shunt. Zio placed today to monitor for arrhthymias.  ASA increased to 325 mg with plans for DAPT X 3 months followed by ASA alone. Avoid low BP given right M2 occlusion. Therapy has been working with patient who continues to be limited by weakness, ataxia, apraxia, decreased in safety awareness as well as balance deficits. CIR recommended due to functional decline.      Review of Systems  Constitutional:  Negative for chills and fever.  HENT:  Negative for hearing loss and tinnitus.   Eyes:  Positive for blurred vision (new  due stroke).  Respiratory:  Positive for cough. Negative for shortness of breath.   Cardiovascular:  Negative for chest pain and palpitations.  Gastrointestinal:  Negative for constipation, heartburn and nausea.  Genitourinary:  Negative for dysuria and urgency.  Musculoskeletal:  Positive for joint pain (right hip). Negative for myalgias.  Neurological:  Positive for speech change and weakness. Negative for dizziness and headaches.  Psychiatric/Behavioral:  The patient has insomnia.           Past Medical History:  Diagnosis Date   Anemia     Articular cartilage disorder of hip     Cerebral ischemia     Congenital deformity of right hip joint     Degeneration of cervical intervertebral disc     Diabetes mellitus without complication (HCC)      type 2   Fatigue     Hyperglycemia due to diabetes mellitus (HCC)     Hyperlipidemia     Hypertension     Hypocalcemia     Hyponatremia     Lower limb pain, inferior, right     Mild cognitive disorder     Pain in joint of left knee     Recurrent falls     Spondylolisthesis of lumbar region     Spondylolisthesis, lumbar region             Past Surgical History:  Procedure Laterality Date   APPENDECTOMY   1970  Family History  Problem Relation Age of Onset   Cancer Mother     Glaucoma Father        Social History:  Lives w/significant other and son. Used to work for [employer] laid off in May. He reports that he has never smoked. He does not have any smokeless tobacco history on file. He reports that he does drinks alcohol occasionally. He does not use drugs.     Allergies: No Known Allergies           Medications Prior to Admission  Medication Sig Dispense Refill   glipiZIDE (GLUCOTROL) 10 MG tablet Take 10 mg by mouth daily before breakfast. (Patient not taking: Reported on 09/01/2021)       metFORMIN (GLUCOPHAGE) 500 MG tablet Take 1 tablet (500 mg total) by mouth 2 (two) times daily with a meal.  (Patient not taking: Reported on 09/01/2021) 60 tablet 3   metoprolol succinate (TOPROL-XL) 50 MG 24 hr tablet Take 1 tablet (50 mg total) by mouth daily. Take with or immediately following a meal. (Patient not taking: Reported on 09/01/2021) 90 tablet 3          Home: Home Living Family/patient expects to be discharged to:: Private residence Living Arrangements: Spouse/significant other, Children Available Help at Discharge: Friend(s), Family, Available 24 hours/day Type of Home: House Home Access: Stairs to enter Secretary/administrator of Steps: 5 Entrance Stairs-Rails: Right Home Layout: Two level, 1/2 bath on main level, Bed/bath upstairs (could stay on 1st floor if needed) Alternate Level Stairs-Number of Steps: 20 Alternate Level Stairs-Rails: Right Bathroom Shower/Tub: Tub/shower unit, Health visitor: Administrator Accessibility: Yes Home Equipment: None  Lives With: Significant other, Other (Comment), Family (63 year old son, cousin at home also)   Functional History: Prior Function Prior Level of Function : Independent/Modified Independent, Driving Mobility Comments: reports he was not working since 20 22 ADLs Comments: reports independent with ADLs; takes son to school (junior high- 6th grade) and then just "relaxes"; questionable historion pt initally reports not driving and then reports driving son to school   Functional Status:  Mobility: Bed Mobility Overal bed mobility: Needs Assistance Bed Mobility: Rolling, Supine to Sit Rolling: Supervision Supine to sit: Supervision Sit to supine: Supervision General bed mobility comments: HOB elevated and use of bedrails. Pt with continued R lateral lean with one instance of tipping compleltly over to R with little awareness. Transfers Overall transfer level: Needs assistance Equipment used: None Transfers: Bed to chair/wheelchair/BSC Sit to Stand: Mod assist (RW) Bed to/from chair/wheelchair/BSC  transfer type:: Stand pivot Stand pivot transfers: Mod assist (heavy mod A) Squat pivot transfers: Mod assist General transfer comment: Pt required x 2 attempts and heavy mod A to transfer bed<>recliner due to poor sequencing/motor planning. Pt then worked on blocked practice sit<>stands x7 with RW and max A fading to mod A with cues for hand placement - emphasis on midline orientation, L visual scanning, and L lateral weight shifting (using mirror for visual feedback). Ambulation/Gait Ambulation/Gait assistance: Max assist Gait Distance (Feet): 2 Feet Assistive device: Rolling walker (2 wheels) Gait Pattern/deviations: Decreased step length - right, Step-to pattern, Decreased stride length, Decreased weight shift to left, Narrow base of support General Gait Details: need +2 for ambulation   ADL: ADL Overall ADL's : Needs assistance/impaired Eating/Feeding: Sitting, Minimal assistance Eating/Feeding Details (indicate cue type and reason): Pt eating on arrival and requiring min verbal cues to scan tray to locate items during eating. Pt observed to return  items to R side of tray, and with decreased orientation to midline during self feeding performance. Pt observed to compensate for scanning with head turns and use of proprioception. Pt required verbal cues as well as visual cues for occulomotor movement to locate items Grooming: Wash/dry face, Oral care, Moderate assistance, Cueing for sequencing, Sitting, Standing Grooming Details (indicate cue type and reason): Pt requring mod A and intermittent max A for standing balance at sink due to heavy R lateral lean. Pt observed with knee bucking in standing, and asking for rest break; unable to anticipate need for rest break ahead of time. Pt observed to requrie additional effort with uncoordinated movement this session as indicated by messy sink after washing face, and overshooting. Pt brushing teeth seated. Encouraging patient to visually track  toothbrush/toothpaste from therapist's hand to pt hand, and pt with skill to track R to L, however, max difficulty scanning to L to locate items without anchor to trach. Pt was observed with poor coordination as inidcated by overshooting during bimanual tasks. Pt requiring Mod A for toothpaste management and to use LUE. Upper Body Bathing: Minimal assistance, Sitting Lower Body Bathing: Maximal assistance, Sit to/from stand Upper Body Dressing : Minimal assistance, Sitting Upper Body Dressing Details (indicate cue type and reason): Min A to don new gown Lower Body Dressing: Maximal assistance, Sit to/from stand, Sitting/lateral leans Toilet Transfer: Moderate assistance, Squat-pivot, +2 for physical assistance, BSC/3in1 Toilet Transfer Details (indicate cue type and reason): Pt requring mod A for squat pivot transfer bed<>recliner and back. Pt with max difficulty following commands to transfer toward L due to inability to sustain attention to L side of environment, thus, defer transfer toward L side. Performing squat pivot toward R on both attempts for safety. Toileting- Clothing Manipulation and Hygiene: Total assistance, Sit to/from stand, +2 for physical assistance, +2 for safety/equipment Toileting - Clothing Manipulation Details (indicate cue type and reason): Pt requiring total A for posterior pericare due to requiring mod +2 support for standing balance. Pt unaware of bowel movement in bed. Functional mobility during ADLs: Moderate assistance (sit<> stand) General ADL Comments: limited by balance, coordination, vision and cognition. Pt with frequent bouts of dysmetria while reaching for items, and placing toothpaste on toothbrush. Pt conpensating for visual deficits with proprioception, however, not utilizing head turns as compensatory strategy this session.   Cognition: Cognition Overall Cognitive Status: Impaired/Different from baseline Arousal/Alertness: Awake/alert Orientation Level:  Oriented X4 Attention: Sustained Sustained Attention: Impaired Sustained Attention Impairment: Verbal basic Memory: Impaired Memory Impairment: Storage deficit, Decreased recall of new information Awareness: Impaired Awareness Impairment: Emergent impairment, Anticipatory impairment Problem Solving: Impaired Problem Solving Impairment: Functional basic Safety/Judgment: Impaired Cognition Arousal/Alertness: Awake/alert Behavior During Therapy: Impulsive, WFL for tasks assessed/performed Overall Cognitive Status: Impaired/Different from baseline Area of Impairment: Safety/judgement, Awareness, Problem solving, Following commands, Attention, Memory, Orientation Orientation Level: Time (Pt reporting he knows he had a stroke) Current Attention Level: Sustained Memory: Decreased short-term memory Following Commands: Follows one step commands inconsistently, Follows one step commands with increased time Safety/Judgement: Decreased awareness of safety, Decreased awareness of deficits Awareness: Intellectual Problem Solving: Slow processing, Difficulty sequencing, Requires verbal cues, Requires tactile cues General Comments: Pt with continued impulsiveness and poor safety awareness. MRI results from 7/25 indicate new acute strokes and as a result pt has required increased assist on 7/25 and 7/26 with mobility - noted worsening of R lateral lean particuarly in standing. Pt remains motivated to get stronger     Blood pressure (!) 133/92, pulse 81,  temperature 98.1 F (36.7 C), resp. rate 18, height 5\' 9"  (1.753 m), weight 83.9 kg, SpO2 99 %. Physical Exam Vitals and nursing note reviewed.  Constitutional:      General: He is not in acute distress.    Appearance: Normal appearance.  HENT:     Head: Normocephalic.     Right Ear: External ear normal.     Left Ear: External ear normal.     Mouth/Throat:     Pharynx: Oropharynx is clear.  Eyes:     Extraocular Movements: Extraocular movements  intact.     Pupils: Pupils are equal, round, and reactive to light.  Cardiovascular:     Rate and Rhythm: Normal rate and regular rhythm.     Heart sounds: No murmur heard.    No gallop.  Abdominal:     General: Bowel sounds are normal. There is no distension.     Palpations: Abdomen is soft.     Tenderness: There is no abdominal tenderness.  Musculoskeletal:        General: No swelling or tenderness. Normal range of motion.     Cervical back: Normal range of motion and neck supple.  Skin:    General: Skin is warm and dry.  Neurological:     Mental Status: He is alert.     Comments: Speech clear. Keeps eyes turned to the right and able to move to left with some effort. Left lateral rectus weakness. Left central 7. Demonstrates almost a left inattention. Able to follow simple motor commands without difficulty. Ataxia with right finger to nose and decreased fine motor movement LLE. Strength 5/5 RUE and RLE and grossly 4 to 4+/5 LUE and LLE. Senses pain in all 4'. No resting tone.   Psychiatric:     Comments: Irritable at times.         Lab Results Last 48 Hours        Results for orders placed or performed during the hospital encounter of 09/01/21 (from the past 48 hour(s))  Glucose, capillary     Status: None    Collection Time: 09/09/21  4:57 PM  Result Value Ref Range    Glucose-Capillary 80 70 - 99 mg/dL      Comment: Glucose reference range applies only to samples taken after fasting for at least 8 hours.    Comment 1 Notify RN      Comment 2 Document in Chart    Glucose, capillary     Status: Abnormal    Collection Time: 09/09/21 11:22 PM  Result Value Ref Range    Glucose-Capillary 134 (H) 70 - 99 mg/dL      Comment: Glucose reference range applies only to samples taken after fasting for at least 8 hours.    Comment 1 Notify RN      Comment 2 Document in Chart    Glucose, capillary     Status: Abnormal    Collection Time: 09/10/21  6:07 AM  Result Value Ref Range     Glucose-Capillary 128 (H) 70 - 99 mg/dL      Comment: Glucose reference range applies only to samples taken after fasting for at least 8 hours.    Comment 1 Notify RN      Comment 2 Document in Chart    CBC     Status: Abnormal    Collection Time: 09/10/21  9:57 AM  Result Value Ref Range    WBC 6.0 4.0 - 10.5 K/uL    RBC  4.85 4.22 - 5.81 MIL/uL    Hemoglobin 13.5 13.0 - 17.0 g/dL    HCT 16.6 06.3 - 01.6 %    MCV 80.8 80.0 - 100.0 fL    MCH 27.8 26.0 - 34.0 pg    MCHC 34.4 30.0 - 36.0 g/dL    RDW 01.0 93.2 - 35.5 %    Platelets 410 (H) 150 - 400 K/uL    nRBC 0.0 0.0 - 0.2 %      Comment: Performed at [facility], [address], [place], [place] [address]  Comprehensive metabolic panel     Status: Abnormal    Collection Time: 09/10/21  9:57 AM  Result Value Ref Range    Sodium 138 135 - 145 mmol/L    Potassium 3.7 3.5 - 5.1 mmol/L    Chloride 105 98 - 111 mmol/L    CO2 25 22 - 32 mmol/L    Glucose, Bld 106 (H) 70 - 99 mg/dL      Comment: Glucose reference range applies only to samples taken after fasting for at least 8 hours.    BUN 10 6 - 20 mg/dL    Creatinine, Ser 2.54 0.61 - 1.24 mg/dL    Calcium 8.9 8.9 - 27.0 mg/dL    Total Protein 6.5 6.5 - 8.1 g/dL    Albumin 3.3 (L) 3.5 - 5.0 g/dL    AST 24 15 - 41 U/L    ALT 23 0 - 44 U/L    Alkaline Phosphatase 93 38 - 126 U/L    Total Bilirubin 1.0 0.3 - 1.2 mg/dL    GFR, Estimated >62 >37 mL/min      Comment: (NOTE) Calculated using the CKD-EPI Creatinine Equation (2021)      Anion gap 8 5 - 15      Comment: Performed at [facility], [address] [place], [place] [address]  Glucose, capillary     Status: Abnormal    Collection Time: 09/10/21 11:58 AM  Result Value Ref Range    Glucose-Capillary 110 (H) 70 - 99 mg/dL      Comment: Glucose reference range applies only to samples taken after fasting for at least 8 hours.    Comment 1 Notify RN      Comment 2 Document in Chart    Glucose, capillary      Status: Abnormal    Collection Time: 09/10/21  4:38 PM  Result Value Ref Range    Glucose-Capillary 113 (H) 70 - 99 mg/dL      Comment: Glucose reference range applies only to samples taken after fasting for at least 8 hours.  Glucose, capillary     Status: Abnormal    Collection Time: 09/10/21  9:17 PM  Result Value Ref Range    Glucose-Capillary 136 (H) 70 - 99 mg/dL      Comment: Glucose reference range applies only to samples taken after fasting for at least 8 hours.    Comment 1 Notify RN      Comment 2 Document in Chart    Glucose, capillary     Status: Abnormal    Collection Time: 09/11/21  6:12 AM  Result Value Ref Range    Glucose-Capillary 139 (H) 70 - 99 mg/dL      Comment: Glucose reference range applies only to samples taken after fasting for at least 8 hours.    Comment 1 Notify RN      Comment 2 Document in Chart  Glucose, capillary     Status: Abnormal    Collection Time: 09/11/21 11:29 AM  Result Value Ref Range    Glucose-Capillary 171 (H) 70 - 99 mg/dL      Comment: Glucose reference range applies only to samples taken after fasting for at least 8 hours.       Imaging Results (Last 48 hours)  ECHO TEE   Result Date: 09/11/2021    TRANSESOPHOGEAL ECHO REPORT   Patient Name:   KELTIN TOBAR Date of Exam: 09/11/2021 Medical Rec #:  [id number]        Height:       69.0 in Accession #:    [id number]       Weight:       185.0 lb Date of Birth:  May 18, 1965        BSA:          1.999 m Patient Age:    55 years         BP:           122/80 mmHg Patient Gender: M                HR:           77 bpm. Exam Location:  Inpatient Procedure: Transesophageal Echo Indications:    Stroke  History:        Patient has prior history of Echocardiogram examinations.  Sonographer:    Bernadene Person RDCS Referring Phys: [place]: The transesophogeal probe was passed without difficulty through the esophogus of the patient. Sedation performed by different physician.  The patient's vital signs; including heart rate, blood pressure, and oxygen saturation; remained stable throughout the procedure. The patient developed no complications during the procedure. IMPRESSIONS  1. No source of embolus noted.  2. Left ventricular ejection fraction, by estimation, is 60 to 65%. The left ventricle has normal function. The left ventricle has no regional wall motion abnormalities. The left ventricular internal cavity size was mildly dilated. There is moderate left ventricular hypertrophy.  3. Right ventricular systolic function is normal. The right ventricular size is normal.  4. Left atrial size was mildly dilated. No left atrial/left atrial appendage thrombus was detected.  5. The mitral valve is normal in structure. No evidence of mitral valve regurgitation. No evidence of mitral stenosis.  6. The aortic valve is normal in structure. Aortic valve regurgitation is not visualized. No aortic stenosis is present.  7. The inferior vena cava is normal in size with greater than 50% respiratory variability, suggesting right atrial pressure of 3 mmHg.  8. Agitated saline contrast bubble study was negative, with no evidence of any interatrial shunt. Conclusion(s)/Recommendation(s): Normal biventricular function without evidence of hemodynamically significant valvular heart disease. FINDINGS  Left Ventricle: Left ventricular ejection fraction, by estimation, is 60 to 65%. The left ventricle has normal function. The left ventricle has no regional wall motion abnormalities. The left ventricular internal cavity size was mildly dilated. There is  moderate left ventricular hypertrophy. Right Ventricle: The right ventricular size is normal. No increase in right ventricular wall thickness. Right ventricular systolic function is normal. Left Atrium: Left atrial size was mildly dilated. No left atrial/left atrial appendage thrombus was detected. Right Atrium: Right atrial size was normal in size. Pericardium:  There is no evidence of pericardial effusion. Mitral Valve: The mitral valve is normal in structure. No evidence of mitral valve regurgitation. No evidence of mitral valve stenosis. Tricuspid Valve: The tricuspid valve is  normal in structure. Tricuspid valve regurgitation is not demonstrated. No evidence of tricuspid stenosis. Aortic Valve: The aortic valve is normal in structure. Aortic valve regurgitation is not visualized. No aortic stenosis is present. Pulmonic Valve: The pulmonic valve was normal in structure. Pulmonic valve regurgitation is not visualized. No evidence of pulmonic stenosis. Aorta: The aortic root is normal in size and structure. Venous: The inferior vena cava is normal in size with greater than 50% respiratory variability, suggesting right atrial pressure of 3 mmHg. IAS/Shunts: No atrial level shunt detected by color flow Doppler. Agitated saline contrast bubble study was negative, with no evidence of any interatrial shunt. Additional Comments: No source of embolus noted. Jenkins Rouge MD Electronically signed by Jenkins Rouge MD Signature Date/Time: 09/11/2021/8:33:06 AM    Final     MR BRAIN W WO CONTRAST   Result Date: 09/09/2021 CLINICAL DATA:  Stroke, follow up worsening of stroke symptoms EXAM: MRI HEAD WITHOUT AND WITH CONTRAST TECHNIQUE: Multiplanar, multiecho pulse sequences of the brain and surrounding structures were obtained without and with intravenous contrast. CONTRAST:  28mL GADAVIST GADOBUTROL 1 MMOL/ML IV SOLN COMPARISON:  MRI head 09/01/2021. FINDINGS: Brain: A few small interval/acute infarcts in the right perirolandic frontoparietal region. Mild increase in conspicuity/size of restricted diffusion in the dorsal lateral right medulla. Edema is similar. No significant mass effect. Many old remote lacunar infarcts involving the basal ganglia and thalami with additional scattered chronic microvascular ischemic change. No evidence of acute hemorrhage, mass lesion, midline shift,  or extra-axial fluid collection. Prior hemorrhage in the basal ganglia, similar. Vascular: Major arterial flow voids are maintained at the skull base. Skull and upper cervical spine: Normal marrow signal. Sinuses/Orbits: Scattered paranasal sinus mucosal thickening with polyp versus retention cyst in the left maxillary sinus. No acute orbital findings. Other: No sizable mastoid effusions. IMPRESSION: 1. A few small interval/acute infarcts in the right perirolandic frontoparietal region. 2. Mild increase in conspicuity/size of restricted diffusion in the dorsal lateral right medulla which could represent evolution of the previously seen infarct versus slight interval expansion of infarct. Edema is similar without mass effect. 3. The remote lacunar infarcts and chronic microvascular ischemic disease. Electronically Signed   By: Margaretha Sheffield M.D.   On: 09/09/2021 19:48           Blood pressure (!) 133/92, pulse 81, temperature 98.1 F (36.7 C), resp. rate 18, height 5\' 9"  (1.753 m), weight 83.9 kg, SpO2 99 %.   Medical Problem List and Plan: 1. Functional deficits secondary to right MCA distribution infarct, likely embolic             -patient may shower             -ELOS/Goals: 14 days, supervision goals 2.  Antithrombotics: -DVT/anticoagulation:  Pharmaceutical: Lovenox             -antiplatelet therapy: DAPT X 3 months followed by ASA 325 mg daily 3. Pain Management:  Tylenol prn.  4. Mood/Behavior/Sleep: LCSW to follow for evaluation and support.              --Trazodone prn for insomnia             -antipsychotic agents: N/A 5. Neuropsych/cognition: This patient is capable of making decisions on his own behalf. 6. Skin/Wound Care:  Routine pressure relief measures.  7. Fluids/Electrolytes/Nutrition: Monitor I/O. Check CMET in am. 8. HTN: Monitor BP TID--avoid hypotension with Right M2 occlusion.             --  continue Cozaar, Coreg and HCTZ 9. T2DM: Hgb A1c- 8.1 (down from 12). Will  continue to monitor BS ac/hs             -continue Glipizide. Continue to hold metformin       Bary Leriche, PA-C 09/11/2021   I have personally performed a face to face diagnostic evaluation of this patient and formulated the key components of the plan.  Additionally, I have personally reviewed laboratory data, imaging studies, as well as relevant notes and concur with the physician assistant's documentation above.  The patient's status has not changed from the original H&P.  Any changes in documentation from the acute care chart have been noted above.  Meredith Staggers, MD, [facility]

## 2021-09-11 NOTE — TOC Transition Note (Signed)
Transition of Care [facility]) - CM/SW Discharge Note   Patient Details  Name: Shawn Austin MRN: [id number] Date of Birth: 30-Aug-1965  Transition of Care [facility]) CM/SW Contact:  Kermit Balo, RN Phone Number: 09/11/2021, 10:44 AM   Clinical Narrative:    Pt discharging to CIR today. CM signing off.    Final next level of care: IP Rehab Facility Barriers to Discharge: Inadequate or no insurance, Barriers Unresolved (comment)   Patient Goals and CMS Choice   CMS Medicare.gov Compare Post Acute Care list provided to:: Patient Choice offered to / list presented to : Patient  Discharge Placement                       Discharge Plan and Services   Discharge Planning Services: CM Consult Post Acute Care Choice: IP Rehab                               Social Determinants of Health (SDOH) Interventions     Readmission Risk Interventions     No data to display

## 2021-09-11 NOTE — Progress Notes (Signed)
Patient arrived and oriented to unit, no questions or concerns. Resting with call bell in place.

## 2021-09-11 NOTE — Progress Notes (Addendum)
Discussed with neurology service, unable to place 30 day monitor in the hospital. Two options were to place Zio AT 14 day monitor in the hospital vs mail a 30 day monitor to the patient after he is discharged from Inpatient Rehab, Dr. Roda Shutters recommended Zio AT monitor. Order placed, EKG team to place monitor today. Dr. Duke Salvia agreed to be the final reader

## 2021-09-11 NOTE — Progress Notes (Signed)
STROKE TEAM PROGRESS NOTE   INTERVAL HISTORY No family at bedside.  Patient lying in bed, no acute event overnight.  Neuro stable.  Had a TEE this morning unremarkable, no PFO.  Put on Zio patch before CIR discharge.  Vitals:   09/11/21 0850 09/11/21 0937 09/11/21 1130 09/11/21 1200  BP: 116/81 133/86 (!) 133/92   Pulse: 82 82 81   Resp: (!) 9 16 18    Temp:  97.9 F (36.6 C)  98.1 F (36.7 C)  TempSrc:  Oral    SpO2: 94% 100% 99%   Weight:      Height:       CBC:  Recent Labs  Lab 09/10/21 0957  WBC 6.0  HGB 13.5  HCT 39.2  MCV 80.8  PLT 410*   Basic Metabolic Panel:  Recent Labs  Lab 09/10/21 0957  NA 138  K 3.7  CL 105  CO2 25  GLUCOSE 106*  BUN 10  CREATININE 1.12  CALCIUM 8.9   Lipid Panel:  No results for input(s): "CHOL", "TRIG", "HDL", "CHOLHDL", "VLDL", "LDLCALC" in the last 168 hours.  HgbA1c:  No results for input(s): "HGBA1C" in the last 168 hours.  Urine Drug Screen:  No results for input(s): "LABOPIA", "COCAINSCRNUR", "LABBENZ", "AMPHETMU", "THCU", "LABBARB" in the last 168 hours.   Alcohol Level  No results for input(s): "ETH" in the last 168 hours.   IMAGING past 24 hours ECHO TEE  Result Date: 09/11/2021    TRANSESOPHOGEAL ECHO REPORT   Patient Name:   CAMARI WISHAM Date of Exam: 09/11/2021 Medical Rec #:  09/13/2021        Height:       69.0 in Accession #:    [id number]       Weight:       185.0 lb Date of Birth:  Jul 20, 1965        BSA:          1.999 m Patient Age:    56 years         BP:           122/80 mmHg Patient Gender: M                HR:           77 bpm. Exam Location:  Inpatient Procedure: Transesophageal Echo Indications:    Stroke  History:        Patient has prior history of Echocardiogram examinations.  Sonographer:    05/08/1965 RDCS Referring Phys: [phone] LINDSAY B ROBERTS PROCEDURE: The transesophogeal probe was passed without difficulty through the esophogus of the patient. Sedation performed by different physician.  The patient's vital signs; including heart rate, blood pressure, and oxygen saturation; remained stable throughout the procedure. The patient developed no complications during the procedure. IMPRESSIONS  1. No source of embolus noted.  2. Left ventricular ejection fraction, by estimation, is 60 to 65%. The left ventricle has normal function. The left ventricle has no regional wall motion abnormalities. The left ventricular internal cavity size was mildly dilated. There is moderate left ventricular hypertrophy.  3. Right ventricular systolic function is normal. The right ventricular size is normal.  4. Left atrial size was mildly dilated. No left atrial/left atrial appendage thrombus was detected.  5. The mitral valve is normal in structure. No evidence of mitral valve regurgitation. No evidence of mitral stenosis.  6. The aortic valve is normal in structure. Aortic valve regurgitation is not visualized. No aortic stenosis is  present.  7. The inferior vena cava is normal in size with greater than 50% respiratory variability, suggesting right atrial pressure of 3 mmHg.  8. Agitated saline contrast bubble study was negative, with no evidence of any interatrial shunt. Conclusion(s)/Recommendation(s): Normal biventricular function without evidence of hemodynamically significant valvular heart disease. FINDINGS  Left Ventricle: Left ventricular ejection fraction, by estimation, is 60 to 65%. The left ventricle has normal function. The left ventricle has no regional wall motion abnormalities. The left ventricular internal cavity size was mildly dilated. There is  moderate left ventricular hypertrophy. Right Ventricle: The right ventricular size is normal. No increase in right ventricular wall thickness. Right ventricular systolic function is normal. Left Atrium: Left atrial size was mildly dilated. No left atrial/left atrial appendage thrombus was detected. Right Atrium: Right atrial size was normal in size. Pericardium:  There is no evidence of pericardial effusion. Mitral Valve: The mitral valve is normal in structure. No evidence of mitral valve regurgitation. No evidence of mitral valve stenosis. Tricuspid Valve: The tricuspid valve is normal in structure. Tricuspid valve regurgitation is not demonstrated. No evidence of tricuspid stenosis. Aortic Valve: The aortic valve is normal in structure. Aortic valve regurgitation is not visualized. No aortic stenosis is present. Pulmonic Valve: The pulmonic valve was normal in structure. Pulmonic valve regurgitation is not visualized. No evidence of pulmonic stenosis. Aorta: The aortic root is normal in size and structure. Venous: The inferior vena cava is normal in size with greater than 50% respiratory variability, suggesting right atrial pressure of 3 mmHg. IAS/Shunts: No atrial level shunt detected by color flow Doppler. Agitated saline contrast bubble study was negative, with no evidence of any interatrial shunt. Additional Comments: No source of embolus noted. Jenkins Rouge MD Electronically signed by Jenkins Rouge MD Signature Date/Time: 09/11/2021/8:33:06 AM    Final     PHYSICAL EXAM  Temp:  [97 F (36.1 C)-98.5 F (36.9 C)] 98.1 F (36.7 C) (07/27 1200) Pulse Rate:  [81-99] 81 (07/27 1130) Resp:  [9-18] 18 (07/27 1130) BP: (100-153)/(68-100) 133/92 (07/27 1130) SpO2:  [94 %-100 %] 99 % (07/27 1130) Weight:  [83.5 kg-83.9 kg] 83.9 kg (07/27 0728)  General - Well nourished, well developed, in no apparent distress.  Ophthalmologic - fundi not visualized due to noncooperation.  Cardiovascular - Regular rhythm and rate.  Mental Status -  Level of arousal and orientation to time, place, and person were intact. Language including expression, naming, repetition, comprehension was assessed and found intact.  Mild dysarthria Fund of Knowledge was assessed and was intact.  Cranial Nerves II - XII - II - Visual field intact OU. III, IV, VI - Extraocular movements  showed left eye incomplete abduction. V - Facial sensation intact bilaterally. VII - left mild facial droop and mildly increase palpebral fissure VIII - Hearing & vestibular intact bilaterally. X - Palate elevates symmetrically. XI - Chin turning & shoulder shrug intact bilaterally. XII - Tongue protrusion intact.  Motor Strength - The patient's strength was symmetrical in all extremities and pronator drift was absent.  Bulk was normal and fasciculations were absent.   Motor Tone - Muscle tone was assessed at the neck and appendages and was normal.  Reflexes - The patient's reflexes were symmetrical in all extremities and he had no pathological reflexes.  Sensory - Light touch, temperature/pinprick were assessed and were symmetrical.    Coordination - The patient had ataxia on the right FTN and HTS.  Tremor was absent.  Gait and Station - deferred.  ASSESSMENT/PLAN NICOLUS OSE is a 56 y.o. male with PMH significant for DM2, HTN, HLD, degenerative spinal disc disease, who presents with leaning to the left and R gaze preference and feeling off balance with falls over the last few days. Neuro exam with ataxia, left worse than right along with slurred speech. Highest suspicion for a small vessel stroke in the posterior circulation.  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  Also ? L ACA A1 segment occlusion  CT Perfusion: Negative MRI: Small focus of acute ischemia at the dorsal lateral right medulla oblongata.  Old left BG, right CR infarcts 2D Echo EF 60 to 65% LDL 169 HgbA1c 8.1 VTE prophylaxis -SCDs No AC/AP prior to admission, now on ASA 325mg  and Plavix 75 mg x 3 months then ASA alone given intracranial stenosis Therapy recommendations: CIR Disposition: Pending  Hypertension Stable, elevated  Avoid low BP On HCTZ 25, Coreg 3.125, Cozaar 50 Long-term BP goal normotensive  Hyperlipidemia Home meds:  none LDL 169, not at goal < 70  On atorvastatin 80mg   Continue statin at discharge  Diabetes type II Uncontrolled HgbA1c 8.1, goal < 7.0 CBGs SSI On metformin Close PCP follow up for risk factor control   Other Stroke Risk Factors   Other Active Problems DJD  Hospital day # 9  Neurology will sign off. Please call with questions. Pt will follow up with stroke clinic NP at [facility] in about 4 weeks. Thanks for the consult.   , MD PhD Stroke Neurology 09/11/2021 6:06 PM   To contact Stroke Continuity provider, please refer to [facility] Plan. After hours, contact General Neurology

## 2021-09-12 ENCOUNTER — Other Ambulatory Visit: Payer: Self-pay

## 2021-09-12 ENCOUNTER — Encounter (HOSPITAL_COMMUNITY): Payer: Self-pay | Admitting: Cardiovascular Disease

## 2021-09-12 DIAGNOSIS — N179 Acute kidney failure, unspecified: Secondary | ICD-10-CM

## 2021-09-12 LAB — GLUCOSE, CAPILLARY
Glucose-Capillary: 124 mg/dL — ABNORMAL HIGH (ref 70–99)
Glucose-Capillary: 128 mg/dL — ABNORMAL HIGH (ref 70–99)
Glucose-Capillary: 236 mg/dL — ABNORMAL HIGH (ref 70–99)
Glucose-Capillary: 140 mg/dL — ABNORMAL HIGH (ref 70–99)
Glucose-Capillary: 86 mg/dL (ref 70–99)

## 2021-09-12 MED ORDER — HYDROCHLOROTHIAZIDE 12.5 MG PO TABS
12.5000 mg | ORAL_TABLET | Freq: Every day | ORAL | Status: DC
  Administered 2021-09-13 – 2021-09-15 (×3): 12.5 mg via ORAL
  Filled 2021-09-12 (×3): qty 1

## 2021-09-12 MED ORDER — SODIUM CHLORIDE 0.9 % IV SOLN: INTRAVENOUS | Status: DC

## 2021-09-12 NOTE — Progress Notes (Signed)
Pt up in the chair and requesting to go to the [facility] aware and will place PIV consult when pt is in the bed.

## 2021-09-12 NOTE — Progress Notes (Addendum)
Inpatient Rehabilitation Center Individual Statement of Services  Patient Name:  Shawn Austin  Date:  09/12/2021  Welcome to the Inpatient Rehabilitation Center.  Our goal is to provide you with an individualized program based on your diagnosis and situation, designed to meet your specific needs.  With this comprehensive rehabilitation program, you will be expected to participate in at least 3 hours of rehabilitation therapies Monday-Friday, with modified therapy programming on the weekends.  Your rehabilitation program will include the following services:  Physical Therapy (PT), Occupational Therapy (OT), Speech Therapy (ST), 24 hour per day rehabilitation nursing, Therapeutic Recreaction (TR), Neuropsychology, Care Coordinator, Rehabilitation Medicine, Nutrition Services, Pharmacy Services, and Other  Weekly team conferences will be held on Wednesdays to discuss your progress.  Your Inpatient Rehabilitation Care Coordinator will talk with you frequently to get your input and to update you on team discussions.  Team conferences with you and your family in attendance may also be held.  Expected length of stay: 14 Days  Overall anticipated outcome:  Supervision   Depending on your progress and recovery, your program may change. Your Inpatient Rehabilitation Care Coordinator will coordinate services and will keep you informed of any changes. Your Inpatient Rehabilitation Care Coordinator's name and contact numbers are listed  below.  The following services may also be recommended but are not provided by the Inpatient Rehabilitation Center:   Home Health Rehabiltiation Services Outpatient Rehabilitation Services  Arrangements will be made to provide these services after discharge if needed.  Arrangements include referral to agencies that provide these services.  Your insurance has been verified to be:  uninsured  Your primary doctor is:  Tiana Loft, FNP  Pertinent information will be  shared with your doctor and your insurance company.  Inpatient Rehabilitation Care Coordinator:  Dossie Der, [place] [phone] or Luna [place]  Information discussed with and copy given to patient by: Andria Rhein, 09/12/2021, 8:57 AM

## 2021-09-12 NOTE — Progress Notes (Signed)
Patient ID: Shawn Austin, male   DOB: 1965/05/17, 56 y.o.   MRN: [id number] Met with the patient to review situation, rehab process, team conference and plan of care. Patient with dual CVAs; unaware of this status with right gaze preference and weakness left side. Reviewed support at discharge and safety measures. Will need additional education on medications, prevention and dietary modification recommendations for secondary risks including  DM (A1C 8.1), HTN, HLD. On DAPT x 3 months after second infarct then ASA solo per neurology. Continue to follow along to discharge to address educational needs to facilitate preparation for discharge. Margarito Liner

## 2021-09-12 NOTE — Evaluation (Signed)
Physical Therapy Assessment and Plan  Patient Details  Name: Shawn Austin MRN: [id number] Date of Birth: 03/03/1965  PT Diagnosis: Abnormal posture, Abnormality of gait, Ataxia, Ataxic gait, Coordination disorder, Difficulty walking, and Muscle weakness Rehab Potential: Good ELOS: 21 days   Today's Date: 09/12/2021 PT Individual Time: 0240-9735 PT Individual Time Calculation (min): 68 min    Hospital Problem: Principal Problem:   Acute stroke of medulla oblongata ([place])   Past Medical History:  Past Medical History:  Diagnosis Date   Anemia    Articular cartilage disorder of hip    Cerebral ischemia    Congenital deformity of right hip joint    Degeneration of cervical intervertebral disc    Diabetes mellitus without complication ([place])    type 2   Fatigue    Hyperglycemia due to diabetes mellitus (HCC)    Hyperlipidemia    Hypertension    Hypocalcemia    Hyponatremia    Lower limb pain, inferior, right    Mild cognitive disorder    Pain in joint of left knee    Recurrent falls    Spondylolisthesis of lumbar region    Spondylolisthesis, lumbar region    Past Surgical History:  Past Surgical History:  Procedure Laterality Date   APPENDECTOMY  1970    Assessment & Plan Clinical Impression: Patient is a 56 y.o. year old male with history of HTN, T2DM, DDD who was admitted on 09/01/21 with reports of leaning to left with falls, slurring of speech as well as eye deviation to right for a few days. CTA head/neck showed diminutive L-ACA with concerns of occlusion, moderate stenosis of distal left M1 and proximal right M2 segment . MRI brain done showing small acute ischemia in dorsal lateral right medulla oblongata with chronic ischemic microangiopathy. Stroke felt to be due to small vessel disease in PCA and Dr.Sethi recommended DAPT X 3 weeks followed by ASA alone. He has had issues with elevated BP as well as poorly controlled BS. D3, thins recommended due to slow  mastication with mild residue.     While awaiting rehab bed, patient developed perseveration with apraxia LUE, worsening of balance as well as visual deficits on 07/25. MRI brain repeated and showed few new interval/acute infarcts in right perirolandic frontoparietal region with mild increase in size of right medullary infract and no change in edema. Dr. Erlinda Hong felt that new R-MCA stroke concerning for cardioembolic pattern. TEE done to rule out embolic source and showed LVEF 60-65% with moderate  LVH, mild dilation of LA without thrombus, effusion or IA shunt. Zio placed today to monitor for arrhthymias.  ASA increased to 325 mg with plans for DAPT X 3 months followed by ASA alone. Avoid low BP given right M2 occlusion. Therapy has been working with patient who continues to be limited by weakness, ataxia, apraxia, decreased in safety awareness as well as balance deficits. CIR recommended due to functional decline.  Patient transferred to CIR on 09/11/2021 .   Patient currently requires max with mobility secondary to muscle weakness, impaired timing and sequencing, unbalanced muscle activation, ataxia, decreased coordination, and decreased motor planning, decreased visual perceptual skills, decreased midline orientation, decreased attention to left, and decreased motor planning, and decreased sitting balance, decreased standing balance, decreased postural control, and decreased balance strategies.  Prior to hospitalization, patient was independent  with mobility and lived with Significant other in a House home.  Home access is 7Stairs to enter.  Patient will benefit from skilled PT intervention to  maximize safe functional mobility, minimize fall risk, and decrease caregiver burden for planned discharge home with intermittent assist.  Anticipate patient will benefit from follow up [facility] at discharge.  PT - End of Session Activity Tolerance: Tolerates 30+ min activity with multiple rests Endurance Deficit:  Yes Endurance Deficit Description: requires seated rest break following functional mobility PT Assessment Rehab Potential (ACUTE/IP ONLY): Good PT Barriers to Discharge: [place] home environment;Decreased caregiver support;Home environment access/layout;Lack of/limited family support PT Barriers to Discharge Comments: decreased caregiver support, 2 story house with 7 steps to enter with 2 HR's and 20 steps with R rail to access bed/bathroom upstairs PT Patient demonstrates impairments in the following area(s): Balance;Perception;Safety;Endurance;Sensory;Motor;Skin Integrity PT Transfers Functional Problem(s): Bed Mobility;Bed to Chair;Car PT Locomotion Functional Problem(s): Ambulation;Wheelchair Mobility;Stairs PT Plan PT Intensity: Minimum of 1-2 x/day ,45 to 90 minutes PT Frequency: 5 out of 7 days PT Duration Estimated Length of Stay: 21 days PT Treatment/Interventions: Ambulation/gait training;Discharge planning;DME/adaptive equipment instruction;Functional mobility training;Psychosocial support;Therapeutic Activities;UE/LE Strength taining/ROM;Visual/perceptual remediation/compensation;Balance/vestibular training;Community reintegration;Disease management/prevention;Neuromuscular re-education;Patient/family education;Stair training;Therapeutic Exercise;UE/LE Coordination activities;Wheelchair propulsion/positioning PT Transfers Anticipated Outcome(s): Supervision PT Locomotion Anticipated Outcome(s): Supervision PT Recommendation Recommendations for Other Services:  (TBD) Follow Up Recommendations: Home health PT Patient destination: Home Equipment Recommended: To be determined Equipment Details: no AD   PT Evaluation Precautions/Restrictions Precautions Precautions: Fall Precaution Comments: R gaze preference, R lateral lean in sitting/standing Restrictions Weight Bearing Restrictions: No Pain Interference Pain Interference Pain Effect on Sleep: 0. Does not apply - I have  not had any pain or hurting in the past 5 days Pain Interference with Therapy Activities: 0. Does not apply - I have not received rehabilitationtherapy in the past 5 days Pain Interference with Day-to-Day Activities: 1. Rarely or not at all Home Living/Prior [place]: Spouse/significant other;Children Available Help at Discharge: Family;Available PRN/intermittently (Significant other) Type of Home: House Home Access: Stairs to enter [employer] of Steps: 7 Entrance Stairs-Rails: Can reach both Home Layout: Two level;Bed/bath upstairs Alternate Level Stairs-Number of Steps: 20 Alternate Level Stairs-Rails: Right Bathroom Shower/Tub: Tub/shower unit;Walk-in shower (walk in shower and walk in tub) Bathroom Toilet: Standard Bathroom Accessibility: Yes  Lives With: Significant other Prior Function Level of Independence: Independent with basic ADLs;Independent with transfers;Independent with gait  Able to Take Stairs?: Yes Driving: Yes Vocation: Unemployed Leisure: Hobbies-yes (Comment) (golf) Vision/Perception  Vision - History Ability to See in Adequate Light: 2 Moderately impaired Vision - Assessment Eye Alignment: Impaired (comment) (eye gaze preference but his eyes can shift to L of midline) Ocular Range of Motion: Restricted on the left Alignment/Gaze Preference: Gaze right;Head tilt Tracking/Visual Pursuits: Decreased smoothness of vertical tracking;Decreased smoothness of horizontal tracking;Left eye does not track laterally;Decreased smoothness of eye movement to LEFT superior field;Decreased smoothness of eye movement to LEFT inferior field;Unable to hold eye position out of midline Convergence: Impaired (comment) Perception Perception: Impaired Inattention/Neglect: Does not attend to left visual field Praxis Praxis: Impaired Praxis Impairment Details: Motor planning  Cognition Overall Cognitive Status: Within Functional Limits  for tasks assessed Arousal/Alertness: Awake/alert Orientation Level: Oriented X4 Memory: Appears intact Awareness: Impaired Problem Solving: Impaired Safety/Judgment: Impaired Sensation Sensation Light Touch: Appears Intact Proprioception: Impaired by gross assessment (right lateral lean) Coordination Gross Motor Movements are Fluid and Coordinated: No Fine Motor Movements are Fluid and Coordinated: No Coordination and Movement Description: grossly impaired due to strength, coordination, balance deficits Finger Nose Finger Test: grossly impaired with R UE more impaired than L UE Heel Shin Test: grossly impaired with L  LE more impaired than R LE Motor  Motor Motor: Other (comment) Motor - Skilled Clinical Observations: grossly impaired due to strength, coordination, balance and motor control deficits   Trunk/Postural Assessment  Cervical Assessment Cervical Assessment: Exceptions to [facility] (forward head and rounded shoulders) Thoracic Assessment Thoracic Assessment: Exceptions to [facility] (trunk flexion and thoracic kyphosis) Lumbar Assessment Lumbar Assessment: Exceptions to [facility] (posterior pelvic tilt) Postural Control Postural Control: Deficits on evaluation (right lateral lean with posterior pelvic tilt)  Balance Balance Balance Assessed: Yes Static Sitting Balance Static Sitting - Balance Support: Bilateral upper extremity supported;Feet supported Static Sitting - Level of Assistance: 5: Stand by assistance (CGA) Dynamic Sitting Balance Dynamic Sitting - Balance Support: Feet supported;Bilateral upper extremity supported Dynamic Sitting - Level of Assistance: 5: Stand by assistance (CGA) Dynamic Sitting - Balance Activities: Lateral lean/weight shifting;Head control activities;Forward lean/weight shifting;Trunk control activities Static Standing Balance Static Standing - Balance Support: During functional activity;Bilateral upper extremity supported Static Standing - Level of  Assistance: 2: Max assist;Other (comment) (2 helpers) Dynamic Standing Balance Dynamic Standing - Balance Support: During functional activity;Bilateral upper extremity supported Dynamic Standing - Level of Assistance: 2: Max assist;Other (comment) (2 helpers) Dynamic Standing - Balance Activities: Lateral lean/weight shifting Extremity Assessment      RLE Assessment RLE Assessment: Exceptions to [facility] General Strength Comments: grossly 3/5 MMT LLE Assessment LLE Assessment: Exceptions to [facility] General Strength Comments: grossly 4/5 MMT  Care Tool Care Tool Bed Mobility Roll left and right activity   Roll left and right assist level: Supervision/Verbal cueing    Sit to lying activity   Sit to lying assist level: Contact Guard/Touching assist    Lying to sitting on side of bed activity   Lying to sitting on side of bed assist level: the ability to move from lying on the back to sitting on the side of the bed with no back support.: Contact Guard/Touching assist     Care Tool Transfers Sit to stand transfer   Sit to stand assist level: Moderate Assistance - Patient 50 - 74%    Chair/bed transfer   Chair/bed transfer assist level: Maximal Assistance - Patient 25 - 49%     Physiological scientist transfer assist level: Maximal Assistance - Patient 25 - 49%      Care Tool Locomotion Ambulation   Assist level: 2 helpers Assistive device: Hand held assist Max distance: 50 feet  Walk 10 feet activity   Assist level: 2 helpers Assistive device: Hand held assist   Walk 50 feet with 2 turns activity   Assist level: 2 helpers Assistive device: Hand held assist  Walk 150 feet activity Walk 150 feet activity did not occur: Safety/medical concerns Assist level:  (unable to perform due to strength, balance and coordiantion deficits)    Walk 10 feet on uneven surfaces activity   Assist level: Maximal Assistance - Patient 25 - 49% Assistive device: Hand held  assist;Other (comment) (handrail)  Stairs   Assist level: Maximal Assistance - Patient 25 - 49% Stairs assistive device: 2 hand rails Max number of stairs: 4 (6 inches)  Walk up/down 1 step activity   Walk up/down 1 step (curb) assist level: Maximal Assistance - Patient 25 - 49% Walk up/down 1 step or curb assistive device: 2 hand rails  Walk up/down 4 steps activity   Walk up/down 4 steps assist level: Maximal Assistance - Patient 25 - 49% Walk up/down 4 steps assistive  device: 2 hand rails  Walk up/down 12 steps activity Walk up/down 12 steps activity did not occur: Safety/medical concerns (unable to perform due to strength, balance and coordiantion deficits)      Pick up small objects from floor Pick up small object from the floor (from standing position) activity did not occur: Safety/medical concerns (unable to perform due to strength, balance and coordiantion deficits)      Wheelchair Is the patient using a wheelchair?: Yes Type of Wheelchair: Manual   Wheelchair assist level: Total Assistance - Patient < 25% Max wheelchair distance: 300 feet  Wheel 50 feet with 2 turns activity   Assist Level: Total Assistance - Patient < 25%  Wheel 150 feet activity   Assist Level: Total Assistance - Patient < 25%    Refer to Care Plan for Long Term Goals  SHORT TERM GOAL WEEK 1 PT Short Term Goal 1 (Week 1): Pt will perform sit to stand with LRAD and min A PT Short Term Goal 2 (Week 1): Pt will perform stand pivot transfer with LRAD and mod A PT Short Term Goal 3 (Week 1): Pt will ambulate 50 ft with LRAD and mod A  Recommendations for other services: None   Skilled Therapeutic Intervention  Pt received seated in w/c at bedside and declines pain. Pt provided social history and prior level of function. PT assessed strength, sensation, coordination and balance. Pt requires mod A with sit to stand transfers and max A with stand pivot transfers to and from recliner. Pt dependently  transferred via w/c to dayroom for time management and energy conservation. Pt requires max A x 2 with gait of 50 ft and pt presents with right lateral lean and left side inattention along with scissoring and narrow base of support. PT provided verbal cueing to address impairments. Pt transported to ortho gym via w/c for time management and energy conservation and performed car transfer with max A and ramp negotiation with unilateral railing with max A. Pt transported to main gym and pt requires max A with stair negotiation x 4 (6 inches) with bilateral railing and pt requires verbal cues for sequencing and motor planning. Transitioned to balance and coordination training seated at mat table. Pt able to identify right lateral lean and correct with verbal and visual (mirror) cues in sitting and requires CGA for static and dynamic seated balance. Pt instructed on RW management with transfers and standing and progressed to sit to stand of min A with verbal and visual cueing with center target on mirror. Pt able to demonstrate weight shift bidirectional laterally and requires mod A for balance. Pt transported via w/c to hospital room from main gym and left seated in w/c at bedside with chair alarm on and all needs in reach. Pt informed of CIR policies and procedures and safety plan updated.     Mobility Bed Mobility Bed Mobility: Not assessed Transfers Transfers: Sit to Stand;Stand to Sit;Stand Pivot Transfers Sit to Stand: Moderate Assistance - Patient 50-74% Stand to Sit: Moderate Assistance - Patient 50-74% Stand Pivot Transfers: Maximal Assistance - Patient 25 - 49% Stand Pivot Transfer Details: Verbal cues for sequencing;Verbal cues for precautions/safety;Verbal cues for safe use of DME/AE;Visual cues/gestures for precautions/safety;Visual cues for safe use of DME/AE;Visual cues/gestures for sequencing;Verbal cues for technique;Verbal cues for gait pattern;Manual facilitation for weight shifting;Manual  facilitation for weight bearing Stand Pivot Transfer Details (indicate cue type and reason): visual and verbal tactile cues for sequencing and motor planning Transfer (Assistive device): Other (  Comment) (none progressed to use of RW) Locomotion  Gait Ambulation: Yes Gait Assistance: 2 Helpers;Maximal Assistance - Patient 25-49% Gait Distance (Feet): 50 Feet (feet) Assistive device: Other (Comment) (2 person hand held assist) Gait Assistance Details: Verbal cues for sequencing;Verbal cues for precautions/safety;Tactile cues for weight beaing;Visual cues/gestures for sequencing;Verbal cues for technique;Verbal cues for gait pattern;Tactile cues for weight shifting;Tactile cues for placement Gait Assistance Details: right lateral lean with gait and requires tactile cueing Gait Gait: Yes Gait Pattern: Impaired Gait Pattern: Decreased step length - right;Decreased stance time - right;Decreased step length - left;Decreased hip/knee flexion - right;Decreased dorsiflexion - right;Lateral trunk lean to right;Decreased weight shift to left;Trunk flexed;Decreased trunk rotation;Shuffle;Poor foot clearance - right;Poor foot clearance - left;Narrow base of support;Scissoring Gait velocity: decreased Stairs / Additional Locomotion Stairs: Yes Stairs Assistance: Maximal Assistance - Patient 25 - 49% Stair Management Technique: Two rails Number of Stairs: 4 Height of Stairs: 6 (inches) Ramp: Maximal Assistance - Patient 25 - 49% Curb: Maximal Assistance - Patient 25 - 49% Wheelchair Mobility Wheelchair Mobility: Yes Wheelchair Assistance: Dependent - Patient 0% Wheelchair Propulsion: Other (comment) (dependent) Wheelchair Parts Management: Needs assistance Distance: ~300 feet   Discharge Criteria: Patient will be discharged from PT if patient refuses treatment 3 consecutive times without medical reason, if treatment goals not met, if there is a change in medical status, if patient makes no progress  towards goals or if patient is discharged from hospital.  The above assessment, treatment plan, treatment alternatives and goals were discussed and mutually agreed upon: by patient  Verl Dicker 09/12/2021, 11:56 AM

## 2021-09-12 NOTE — Progress Notes (Signed)
Inpatient Rehabilitation  Patient information reviewed and entered into eRehab system by Kholton Coate M. Kodi Guerrera, M.A., CCC/SLP, PPS Coordinator.  Information including medical coding, functional ability and quality indicators will be reviewed and updated through discharge.    

## 2021-09-12 NOTE — Evaluation (Signed)
Occupational Therapy Assessment and Plan  Patient Details  Name: Shawn Austin MRN: [id number] Date of Birth: [birth year]  OT Diagnosis: abnormal posture, apraxia, ataxia, cognitive deficits, disturbance of vision, and hemiplegia affecting dominant side Rehab Potential:   ELOS: 20-21 days   Today's Date: 09/12/2021 OT Individual Time: 0093-8182 OT Individual Time Calculation (min): 75 min     Hospital Problem: Principal Problem:   Acute stroke of medulla oblongata ([place])   Past Medical History:  Past Medical History:  Diagnosis Date   Anemia    Articular cartilage disorder of hip    Cerebral ischemia    Congenital deformity of right hip joint    Degeneration of cervical intervertebral disc    Diabetes mellitus without complication (HCC)    type 2   Fatigue    Hyperglycemia due to diabetes mellitus (HCC)    Hyperlipidemia    Hypertension    Hypocalcemia    Hyponatremia    Lower limb pain, inferior, right    Mild cognitive disorder    Pain in joint of left knee    Recurrent falls    Spondylolisthesis of lumbar region    Spondylolisthesis, lumbar region    Past Surgical History:  Past Surgical History:  Procedure Laterality Date   [place] STUDY  09/11/2021   Procedure: BUBBLE STUDY;  Surgeon: Shawn Hector, MD;  Location: [facility];  Service: Cardiovascular;;   TEE WITHOUT CARDIOVERSION N/A 09/11/2021   Procedure: TRANSESOPHAGEAL ECHOCARDIOGRAM (TEE);  Surgeon: Shawn Hector, MD;  Location: [facility] ENDOSCOPY;  Service: Cardiovascular;  Laterality: N/A;    Assessment & Plan Clinical Impression: .Shawn Austin is a 55 year old RH-male with history of HTN, T2DM, DDD who was admitted on 09/01/21 with reports of leaning to left with falls, slurring of speech as well as eye deviation to right for a few days. CTA head/neck showed diminutive L-ACA with concerns of occlusion, moderate stenosis of distal left M1 and proximal right M2 segment . MRI brain  done showing small acute ischemia in dorsal lateral right medulla oblongata with chronic ischemic microangiopathy. Stroke felt to be due to small vessel disease in PCA and Shawn Austin recommended DAPT X 3 weeks followed by ASA alone. He has had issues with elevated BP as well as poorly controlled BS. D3, thins recommended due to slow mastication with mild residue.     While awaiting rehab bed, patient developed perseveration with apraxia LUE, worsening of balance as well as visual deficits on 07/25. MRI brain repeated and showed few new interval/acute infarcts in right perirolandic frontoparietal region with mild increase in size of right medullary infract and no change in edema. Dr. Erlinda Austin felt that new R-MCA stroke concerning for cardioembolic pattern. TEE done to rule out embolic source and showed LVEF 60-65% with moderate  LVH, mild dilation of LA without thrombus, effusion or IA shunt. Zio placed today to monitor for arrhthymias.  ASA increased to 325 mg with plans for DAPT X 3 months followed by ASA alone. Avoid low BP given right M2 occlusion. Therapy has been working with patient who continues to be limited by weakness, ataxia, apraxia, decreased in safety awareness as well as balance deficits. CIR recommended due to functional decline.     Patient transferred to CIR on 09/11/2021 .    Patient currently requires mod with basic self-care skills secondary to motor apraxia and decreased coordination, decreased visual perceptual skills and decreased visual motor skills, decreased attention to left, decreased  visual acuity, decreased attention, decreased awareness, decreased problem solving, decreased safety awareness, and decreased memory, and decreased sitting balance, decreased standing balance, decreased postural control, and decreased balance strategies.  Prior to hospitalization, patient was fully independent.   Patient will benefit from skilled intervention to increase independence with basic self-care  skills prior to discharge home with care partner.  Anticipate patient will require 24 hour supervision and follow up outpatient.  OT - End of Session Activity Tolerance: Tolerates 10 - 20 min activity with multiple rests Endurance Deficit: Yes Endurance Deficit Description: requires seated rest break following functional mobility OT Assessment OT Barriers to Discharge: Decreased caregiver support OT Barriers to Discharge Comments: pt reports his wife works during the day OT Patient demonstrates impairments in the following area(s): Balance;Cognition;Endurance;Motor;Perception;Safety;Vision OT Basic ADL's Functional Problem(s): Grooming;Bathing;Dressing;Toileting OT Transfers Functional Problem(s): Toilet;Tub/Shower OT Additional Impairment(s): None OT Plan OT Intensity: Minimum of 1-2 x/day, 45 to 90 minutes OT Frequency: 5 out of 7 days OT Duration/Estimated Length of Stay: 20-21 days OT Treatment/Interventions: Balance/vestibular training;Cognitive remediation/compensation;Discharge planning;DME/adaptive equipment instruction;Neuromuscular re-education;Functional mobility training;Patient/family education;Psychosocial support;Self Care/advanced ADL retraining;Therapeutic Activities;Therapeutic Exercise;UE/LE Strength taining/ROM;UE/LE Coordination activities;Visual/perceptual remediation/compensation OT Self Feeding Anticipated Outcome(s): independent OT Basic Self-Care Anticipated Outcome(s): supervision OT Toileting Anticipated Outcome(s): supervision OT Bathroom Transfers Anticipated Outcome(s): supervision OT Recommendation Patient destination: Home Follow Up Recommendations: Outpatient OT Equipment Recommended: Tub/shower bench;3 in 1 bedside comode   OT Evaluation Precautions/Restrictions  Precautions Precautions: Fall Precaution Comments: R gaze preference, R lateral lean in sitting/standing Restrictions Weight Bearing Restrictions: No General   Vital Signs  Pain Pain  Assessment Pain Score: 0-No pain Home Living/Prior Functioning Home Living Family/patient expects to be discharged to:: Private residence Living Arrangements: Spouse/significant other, Children Available Help at Discharge: Family, Available PRN/intermittently (Significant other) Type of Home: House Home Access: Stairs to enter [employer] of Steps: 7 Entrance Stairs-Rails: Can reach both Home Layout: Two level, Bed/bath upstairs Alternate Level Stairs-Number of Steps: 20 Alternate Level Stairs-Rails: Right Bathroom Shower/Tub: Tub/shower unit, Walk-in shower (walk in shower and walk in tub) Bathroom Toilet: Standard Bathroom Accessibility: Yes  Lives With: Significant other Prior Function Level of Independence: Independent with basic ADLs, Independent with transfers, Independent with gait  Able to Take Stairs?: Yes Driving: Yes Vocation: Unemployed Vocation Requirements: pt told me he worked as a [employer], unclear if this is accurate Leisure: Hobbies-yes (Comment) (golf) Vision Baseline Vision/History: 0 No visual deficits (pt reports that he has never worn glasses) Ability to See in Adequate Light: 2 Moderately impaired Patient Visual Report: Blurring of vision (L eye more blurry than R, but has blurriness in both eyes) Vision Assessment?: Yes Eye Alignment: Impaired (comment) (eye gaze preference but his eyes can shift to L of midline) Ocular Range of Motion: Restricted on the left Alignment/Gaze Preference: Gaze right;Head tilt Tracking/Visual Pursuits: Decreased smoothness of vertical tracking;Decreased smoothness of horizontal tracking;Left eye does not track laterally;Decreased smoothness of eye movement to LEFT superior field;Decreased smoothness of eye movement to LEFT inferior field;Unable to hold eye position out of midline Convergence: Impaired (comment) Visual Fields: Other (comment);Left visual field deficit (to be assessed  further,) Perception  Perception: Impaired Inattention/Neglect: Does not attend to left visual field Praxis Praxis: Impaired Praxis Impairment Details: Motor planning Cognition Cognition Overall Cognitive Status: Impaired/Different from baseline Arousal/Alertness: Awake/alert Orientation Level: Person;Place;Situation Person: Oriented Place: Oriented Situation: Oriented Memory: Appears intact Memory Impairment: Storage deficit;Decreased recall of new information Attention: Sustained Sustained Attention: Appears intact Awareness: Impaired Awareness Impairment: Emergent impairment;Anticipatory  impairment Problem Solving: Impaired Problem Solving Impairment: Functional basic Behaviors: Perseveration (slight perseveration on tasks noted) Safety/Judgment: Impaired Brief Interview for Mental Status (BIMS) Repetition of Three Words (First Attempt): 3 Temporal Orientation: Year: Correct Temporal Orientation: Month: Accurate within 5 days Temporal Orientation: Day: Incorrect Recall: "Sock": Yes, after cueing ("something to wear") Recall: "Blue": Yes, after cueing ("a color") Recall: "Bed": No, could not recall BIMS Summary Score: 10 Sensation Sensation Light Touch: Appears Intact Hot/Cold: Appears Intact Proprioception: Impaired by gross assessment (right lateral lean) Stereognosis: Not tested Coordination Gross Motor Movements are Fluid and Coordinated: No Fine Motor Movements are Fluid and Coordinated: No Coordination and Movement Description: grossly impaired due to strength, coordination, balance deficits Finger Nose Finger Test: grossly impaired with R UE more impaired than L UE Heel Shin Test: grossly impaired with L LE more impaired than R LE Motor  Motor Motor: Other (comment) Motor - Skilled Clinical Observations: grossly impaired due to strength, coordination, balance and motor control deficits  Trunk/Postural Assessment  Cervical Assessment Cervical Assessment:  Exceptions to [facility] (forward head and rounded shoulders) Thoracic Assessment Thoracic Assessment: Exceptions to [facility] (trunk flexion and thoracic kyphosis) Lumbar Assessment Lumbar Assessment: Exceptions to [facility] (posterior pelvic tilt) Postural Control Postural Control: Deficits on evaluation (right lateral lean with posterior pelvic tilt)  Balance Balance Balance Assessed: Yes Static Sitting Balance Static Sitting - Balance Support: Bilateral upper extremity supported;Feet supported Static Sitting - Level of Assistance: 5: Stand by assistance (CGA) Dynamic Sitting Balance Dynamic Sitting - Balance Support: Feet supported;Bilateral upper extremity supported Dynamic Sitting - Level of Assistance: 5: Stand by assistance (CGA) Dynamic Sitting - Balance Activities: Lateral lean/weight shifting;Head control activities;Forward lean/weight shifting;Trunk control activities Static Standing Balance Static Standing - Balance Support: During functional activity;Bilateral upper extremity supported Static Standing - Level of Assistance: 2: Max assist;Other (comment) (2 helpers) Dynamic Standing Balance Dynamic Standing - Balance Support: During functional activity;Bilateral upper extremity supported Dynamic Standing - Level of Assistance: 2: Max assist;Other (comment) (2 helpers) Dynamic Standing - Balance Activities: Lateral lean/weight shifting Extremity/Trunk Assessment RUE Assessment Active Range of Motion (AROM) Comments: WFL General Strength Comments: 4-/5 LUE Assessment Active Range of Motion (AROM) Comments: WFL General Strength Comments: 4/5  Care Tool Care Tool Self Care Eating   Eating Assist Level: Set up assist    Oral Care    Oral Care Assist Level: Set up assist    Bathing   Body parts bathed by patient: Right arm;Chest;Abdomen;Left arm;Front perineal area;Right upper leg;Left upper leg;Face Body parts bathed by helper: Buttocks;Right lower leg;Left lower leg   Assist Level:  Moderate Assistance - Patient 50 - 74%    Upper Body Dressing(including orthotics)   What is the patient wearing?: Pull over shirt   Assist Level: Minimal Assistance - Patient > 75%    Lower Body Dressing (excluding footwear)   What is the patient wearing?: Pants;Incontinence brief Assist for lower body dressing: Moderate Assistance - Patient 50 - 74%    Putting on/Taking off footwear   What is the patient wearing?: Non-skid slipper socks Assist for footwear: Moderate Assistance - Patient 50 - 74%       Care Tool Toileting Toileting activity   Assist for toileting: Maximal Assistance - Patient 25 - 49%     Care Tool Bed Mobility Roll left and right activity   Roll left and right assist level: Supervision/Verbal cueing    Sit to lying activity   Sit to lying assist level: Contact Guard/Touching assist  Lying to sitting on side of bed activity   Lying to sitting on side of bed assist level: the ability to move from lying on the back to sitting on the side of the bed with no back support.: Contact Guard/Touching assist     Care Tool Transfers Sit to stand transfer   Sit to stand assist level: Moderate Assistance - Patient 50 - 74%    Chair/bed transfer   Chair/bed transfer assist level: Moderate Assistance - Patient 50 - 74%     Toilet transfer   Assist Level: Moderate Assistance - Patient 50 - 74%     Care Tool Cognition  Expression of Ideas and Wants Expression of Ideas and Wants: 4. Without difficulty (complex and basic) - expresses complex messages without difficulty and with speech that is clear and easy to understand  Understanding Verbal and Non-Verbal Content Understanding Verbal and Non-Verbal Content: 3. Usually understands - understands most conversations, but misses some part/intent of message. Requires cues at times to understand   Memory/Recall Ability Memory/Recall Ability : Current season;That he or she is in a hospital/hospital unit   Refer to Care  Plan for [facility] 1 OT Short Term Goal 1 (Week 1): Pt will be able to complete squat pivot transfers to toilet with min A. OT Short Term Goal 2 (Week 1): Pt will be able to stand from toilet and hold balance with min A while he pulls pants over hips. OT Short Term Goal 3 (Week 1): Pt will don a shirt with supervision demonstrating improved coordination. OT Short Term Goal 4 (Week 1): Pt will scan to L environment with min cues or less.  Recommendations for other services: None    Skilled Therapeutic Intervention ADL ADL Eating: Set up Grooming: Setup Where Assessed-Grooming: Sitting at sink Upper Body Bathing: Supervision/safety;Minimal cueing Where Assessed-Upper Body Bathing: Chair Lower Body Bathing: Moderate cueing;Moderate assistance Where Assessed-Lower Body Bathing: Chair Upper Body Dressing: Minimal assistance Where Assessed-Upper Body Dressing: Chair Lower Body Dressing: Moderate assistance Where Assessed-Lower Body Dressing: Chair Toileting: Maximal assistance Where Assessed-Toileting: Glass blower/designer: Moderate assistance Toilet Transfer Method: Squat pivot Toilet Transfer Equipment: Bedside commode (BSC over toilet) Mobility  Bed Mobility Bed Mobility: Not assessed Transfers Sit to Stand: Moderate Assistance - Patient 50-74% Stand to Sit: Moderate Assistance - Patient 50-74%  Pt seen for initial evaluation and ADL training with a focus on balance, postural control and safety awareness. Explained role of OT, discussed POC.  Also discussed with pt deficits noted by his acute care team and how we will be working on those immediately (impulsivity, attention and L side awareness). With this education, pt did very well responding to cues and directions to keep his mobility safe. Discussed with pt his goals.  Pt completed self care this am and wc provided to pt along with necessary BR equipment of BSC to go over toilet.  Pt in wc with  nurse tech assisting to place his alarm belt.    Discharge Criteria: Patient will be discharged from OT if patient refuses treatment 3 consecutive times without medical reason, if treatment goals not met, if there is a change in medical status, if patient makes no progress towards goals or if patient is discharged from hospital.  The above assessment, treatment plan, treatment alternatives and goals were discussed and mutually agreed upon: by patient  [facility] 09/12/2021, 1:34 PM

## 2021-09-12 NOTE — Evaluation (Signed)
Speech Language Pathology Assessment and Plan  Patient Details  Name: Shawn Austin MRN: [id number] Date of Birth: 06-06-1965  SLP Diagnosis: Cognitive Impairments  Rehab Potential: Good ELOS: 3-3.5 weeks   Today's Date: 09/12/2021 SLP Individual Time: 7253-6644 SLP Individual Time Calculation (min): 39 min  Hospital Problem: Principal Problem:   Acute stroke of medulla oblongata ([place])  Past Medical History:  Past Medical History:  Diagnosis Date   Anemia    Articular cartilage disorder of hip    Cerebral ischemia    Congenital deformity of right hip joint    Degeneration of cervical intervertebral disc    Diabetes mellitus without complication (HCC)    type 2   Fatigue    Hyperglycemia due to diabetes mellitus (HCC)    Hyperlipidemia    Hypertension    Hypocalcemia    Hyponatremia    Lower limb pain, inferior, right    Mild cognitive disorder    Pain in joint of left knee    Recurrent falls    Spondylolisthesis of lumbar region    Spondylolisthesis, lumbar region    Past Surgical History:  Past Surgical History:  Procedure Laterality Date   [place] STUDY  09/11/2021   Procedure: BUBBLE STUDY;  Surgeon: Josue Hector, MD;  Location: [place];  Service: Cardiovascular;;   TEE WITHOUT CARDIOVERSION N/A 09/11/2021   Procedure: TRANSESOPHAGEAL ECHOCARDIOGRAM (TEE);  Surgeon: Josue Hector, MD;  Location: [facility] ENDOSCOPY;  Service: Cardiovascular;  Laterality: N/A;    Assessment / Plan / Recommendation Clinical Impression  HPI:  Shawn Austin is a 56 year old RH-male with history of HTN, T2DM, DDD who was admitted on 09/01/21 with reports of leaning to left with falls, slurring of speech as well as eye deviation to right for a few days. CTA head/neck showed diminutive L-ACA with concerns of occlusion, moderate stenosis of distal left M1 and proximal right M2 segment . MRI brain done showing small acute ischemia in dorsal lateral right medulla  oblongata with chronic ischemic microangiopathy. While awaiting rehab bed, patient developed perseveration with apraxia LUE, worsening of balance as well as visual deficits on 07/25. MRI brain repeated and showed few new interval/acute infarcts in right perirolandic frontoparietal region with mild increase in size of right medullary infract and no change in edema. Therapy has been working with patient who continues to be limited by weakness, ataxia, apraxia, decreased in safety awareness as well as balance deficits. CIR recommended due to functional decline.   Pt presents with cognitive-linguistic deficits in the areas of sustained attention, basic problem solving, recall, and awareness of deficits. Pt was aware he had a stroke however appeared to have limited insight into visual deficits, and denied any cognitive-linguistic changes. Pt appeared to have low health literacy and suspect poor historian due to providing conflicting information. Pt exhibited significant right gaze preference and made minimal eye contact with therapist, often laying head down on table. At one point during assessment, pt abruptly became verbally agitated and began shouting responses without any evidence of frustration leading to outburst. SLP gave pt additional time and there were no further signs of agitation or raised voice during session.  The [facility] Mental Status Examination was completed to evaluate the pt's cognitive-linguistic skills. Pt achieved a score of 13/30 which is below the normal limits of 27 or more out of 30 and is suggestive of a severe impairment. Pt rated assessment as "easy". Reviewed results with pt and  recommendation for further intervention to address areas of difficulty. Pt verbalized agreement.  Speech was clear without evidence of dysarthria and expressive/receptive language skills appeared grossly intact for tasks assessed. Pt provided short responses however this was interpreted to be  attributed to personality/interest vs. language impairment. Dysarthria noted in acute care - this appears to have significantly improved/resolved.   Recommend skilled ST intervention to address cognitive-linguistic deficits to maximize functional independence prior to discharge. Anticipate pt will require 24 hour supervision due to decreased insight into deficits and safety awareness concerns.   Skilled Therapeutic Interventions          Pt participated in [place] Status Examination (SLUMS) as well as further non-standardized assessments of cognitive-linguistic, speech, and language function. Please see above.    SLP Assessment  Patient will need skilled [facility] during CIR admission    Recommendations  Recommendations for Other Services: Neuropsych consult Patient destination: Home Follow up Recommendations: 24 hour supervision/assistance;Home Health SLP Equipment Recommended: None recommended by SLP    SLP Frequency 3 to 5 out of 7 days   SLP Duration  SLP Intensity  SLP Treatment/Interventions 3-3.5 weeks  Minumum of 1-2 x/day, 30 to 90 minutes  Cognitive remediation/compensation;Internal/external aids;Functional tasks;Patient/family education;Therapeutic Activities    Pain Pain Assessment Pain Score: 0-No pain  Prior Functioning Cognitive/Linguistic Baseline: Baseline deficits Baseline deficit details: documented as having mild cognitive impairment at prior level but no further information Type of Home: House  Lives With: Significant other Available Help at Discharge: Family;Available PRN/intermittently Education: College Vocation: Full time employment (pt reports full time employment at the [employer] - will need to confirm details d/t questionable historian)  SLP Evaluation Cognition Overall Cognitive Status: Impaired/Different from baseline Arousal/Alertness: Awake/alert Orientation Level: Oriented X4 Year: 2023 Month:  July Day of Week: Correct Attention: Sustained Sustained Attention: Impaired Sustained Attention Impairment: Verbal basic Memory: Impaired Memory Impairment: Retrieval deficit;Storage deficit;Decreased recall of new information Awareness: Impaired Awareness Impairment: Intellectual impairment Problem Solving: Impaired Problem Solving Impairment: Functional basic Executive Function:  (lower level deficits impact all areas) Behaviors: Verbal agitation;Poor frustration tolerance Safety/Judgment: Impaired Comments: Pt exhibited abrupt verbal agitation and poor frustration tolerance  Comprehension Auditory Comprehension Overall Auditory Comprehension: Appears within functional limits for tasks assessed Expression Expression Primary Mode of Expression: Verbal Verbal Expression Overall Verbal Expression: Appears within functional limits for tasks assessed Written Expression Dominant Hand: Right Oral Motor Oral Motor/Sensory Function Overall Oral Motor/Sensory Function: Within functional limits Motor Speech Overall Motor Speech: Appears within functional limits for tasks assessed Respiration: Within functional limits Phonation: Hoarse (mild) Resonance: Within functional limits Articulation: Within functional limitis Intelligibility: Intelligible  Care Tool Care Tool Cognition Ability to hear (with hearing aid or hearing appliances if normally used Ability to hear (with hearing aid or hearing appliances if normally used): 0. Adequate - no difficulty in normal conservation, social interaction, listening to TV   Expression of Ideas and Wants Expression of Ideas and Wants: 3. Some difficulty - exhibits some difficulty with expressing needs and ideas (e.g, some words or finishing thoughts) or speech is not clear   Understanding Verbal and Non-Verbal Content Understanding Verbal and Non-Verbal Content: 3. Usually understands - understands most conversations, but misses some part/intent of  message. Requires cues at times to understand  Memory/Recall Ability Memory/Recall Ability : Current season;That he or she is in a hospital/hospital unit   PMSV Assessment  PMSV Trial Intelligibility: Intelligible  Short Term Goals: Week 1: SLP Short Term Goal 1 (Week  1): Patient will sustain attention to functional tasks for 5 minute intervals with mod A verbal redirection SLP Short Term Goal 2 (Week 1): Patient will complete basic problem solving tasks with max A verbal/visual cues to achieve 75% accuracy SLP Short Term Goal 3 (Week 1): Patient will utilize external memory aids to recall functional information with mod A verbal cues for effectiveness SLP Short Term Goal 4 (Week 1): Patient will demonstrate awareness to errors and repair with max A verbal cues SLP Short Term Goal 5 (Week 1): Pt will visually scan left during functional tasks with max A verbal cues during 75% of opportunities  Refer to Care Plan for Long Term Goals  Recommendations for other services: Neuropsych  Discharge Criteria: Patient will be discharged from SLP if patient refuses treatment 3 consecutive times without medical reason, if treatment goals not met, if there is a change in medical status, if patient makes no progress towards goals or if patient is discharged from hospital.  The above assessment, treatment plan, treatment alternatives and goals were discussed and mutually agreed upon: by patient  Patty Sermons 09/12/2021, 3:51 PM

## 2021-09-12 NOTE — Plan of Care (Signed)
  Problem: RH Problem Solving Goal: LTG Patient will demonstrate problem solving for (SLP) Description: LTG:  Patient will demonstrate problem solving for basic/complex daily situations with cues  (SLP) Flowsheets (Taken 09/12/2021 1552) LTG: Patient will demonstrate problem solving for (SLP): Basic daily situations LTG Patient will demonstrate problem solving for: Minimal Assistance - Patient > 75%   Problem: RH Memory Goal: LTG Patient will demonstrate ability for day to day (SLP) Description: LTG:   Patient will demonstrate ability for day to day recall/carryover during cognitive/linguistic activities with assist  (SLP) Flowsheets (Taken 09/12/2021 1552) LTG: Patient will demonstrate ability for day to day recall: New information LTG: Patient will demonstrate ability for day to day recall/carryover during cognitive/linguistic activities with assist (SLP): Minimal Assistance - Patient > 75% Goal: LTG Patient will use memory compensatory aids to (SLP) Description: LTG:  Patient will use memory compensatory aids to recall biographical/new, daily complex information with cues (SLP) Flowsheets (Taken 09/12/2021 1552) LTG: Patient will use memory compensatory aids to (SLP): Minimal Assistance - Patient > 75%   Problem: RH Attention Goal: LTG Patient will demonstrate this level of attention during functional activites (SLP) Description: LTG:  Patient will will demonstrate this level of attention during functional activites (SLP) Flowsheets (Taken 09/12/2021 1552) Patient will demonstrate during cognitive/linguistic activities the attention type of: Sustained LTG: Patient will demonstrate this level of attention during cognitive/linguistic activities with assistance of (SLP): Minimal Assistance - Patient > 75% Number of minutes patient will demonstrate attention during cognitive/linguistic activities: 15   Problem: RH Awareness Goal: LTG: Patient will demonstrate awareness during functional  activites type of (SLP) Description: LTG: Patient will demonstrate awareness during functional activites type of (SLP) Flowsheets (Taken 09/12/2021 1552) Patient will demonstrate during cognitive/linguistic activities awareness type of:  Intellectual  Emergent LTG: Patient will demonstrate awareness during cognitive/linguistic activities with assistance of (SLP): Minimal Assistance - Patient > 75%

## 2021-09-12 NOTE — Progress Notes (Addendum)
PROGRESS NOTE   Subjective/Complaints: Pt in chair this afternoon. No new complaints elicited. Scr elevated this AM.   Review of Systems  Constitutional:  Negative for chills and fever.  Eyes:  Positive for blurred vision.  Respiratory:  Negative for shortness of breath.   Cardiovascular:  Negative for chest pain.  Gastrointestinal:  Negative for abdominal pain, constipation, nausea and vomiting.  Musculoskeletal:  Positive for joint pain.  Neurological:  Positive for speech change and weakness.    Objective:   ECHO TEE  Result Date: 09/11/2021    TRANSESOPHOGEAL ECHO REPORT   Patient Name:   Shawn Austin Date of Exam: 09/11/2021 Medical Rec #:  [id number]        Height:       69.0 in Accession #:    [id number]       Weight:       185.0 lb Date of Birth:  1965/12/19        BSA:          1.999 m Patient Age:    56 years         BP:           122/80 mmHg Patient Gender: M                HR:           77 bpm. Exam Location:  Inpatient Procedure: Transesophageal Echo Indications:    Stroke  History:        Patient has prior history of Echocardiogram examinations.  Sonographer:    Eulah Pont RDCS Referring Phys: [id number] LINDSAY B ROBERTS PROCEDURE: The transesophogeal probe was passed without difficulty through the esophogus of the patient. Sedation performed by different physician. The patient's vital signs; including heart rate, blood pressure, and oxygen saturation; remained stable throughout the procedure. The patient developed no complications during the procedure. IMPRESSIONS  1. No source of embolus noted.  2. Left ventricular ejection fraction, by estimation, is 60 to 65%. The left ventricle has normal function. The left ventricle has no regional wall motion abnormalities. The left ventricular internal cavity size was mildly dilated. There is moderate left ventricular hypertrophy.  3. Right ventricular systolic function is normal. The  right ventricular size is normal.  4. Left atrial size was mildly dilated. No left atrial/left atrial appendage thrombus was detected.  5. The mitral valve is normal in structure. No evidence of mitral valve regurgitation. No evidence of mitral stenosis.  6. The aortic valve is normal in structure. Aortic valve regurgitation is not visualized. No aortic stenosis is present.  7. The inferior vena cava is normal in size with greater than 50% respiratory variability, suggesting right atrial pressure of 3 mmHg.  8. Agitated saline contrast bubble study was negative, with no evidence of any interatrial shunt. Conclusion(s)/Recommendation(s): Normal biventricular function without evidence of hemodynamically significant valvular heart disease. FINDINGS  Left Ventricle: Left ventricular ejection fraction, by estimation, is 60 to 65%. The left ventricle has normal function. The left ventricle has no regional wall motion abnormalities. The left ventricular internal cavity size was mildly dilated. There is  moderate left ventricular hypertrophy. Right Ventricle: The right ventricular  size is normal. No increase in right ventricular wall thickness. Right ventricular systolic function is normal. Left Atrium: Left atrial size was mildly dilated. No left atrial/left atrial appendage thrombus was detected. Right Atrium: Right atrial size was normal in size. Pericardium: There is no evidence of pericardial effusion. Mitral Valve: The mitral valve is normal in structure. No evidence of mitral valve regurgitation. No evidence of mitral valve stenosis. Tricuspid Valve: The tricuspid valve is normal in structure. Tricuspid valve regurgitation is not demonstrated. No evidence of tricuspid stenosis. Aortic Valve: The aortic valve is normal in structure. Aortic valve regurgitation is not visualized. No aortic stenosis is present. Pulmonic Valve: The pulmonic valve was normal in structure. Pulmonic valve regurgitation is not visualized. No  evidence of pulmonic stenosis. Aorta: The aortic root is normal in size and structure. Venous: The inferior vena cava is normal in size with greater than 50% respiratory variability, suggesting right atrial pressure of 3 mmHg. IAS/Shunts: No atrial level shunt detected by color flow Doppler. Agitated saline contrast bubble study was negative, with no evidence of any interatrial shunt. Additional Comments: No source of embolus noted. Jenkins Rouge MD Electronically signed by Jenkins Rouge MD Signature Date/Time: 09/11/2021/8:33:06 AM    Final    Recent Labs    09/10/21 0957 09/11/21 1846  WBC 6.0 5.9  HGB 13.5 12.9*  HCT 39.2 38.2*  PLT 410* 420*   Recent Labs    09/10/21 0957 09/11/21 1846  NA 138 136  K 3.7 3.8  CL 105 102  CO2 25 25  GLUCOSE 106* 130*  BUN 10 17  CREATININE 1.12 1.29*  CALCIUM 8.9 8.7*    Intake/Output Summary (Last 24 hours) at 09/12/2021 1003 Last data filed at 09/12/2021 0845 Gross per 24 hour  Intake 472 ml  Output 300 ml  Net 172 ml        Physical Exam: Vital Signs Blood pressure (!) 152/86, pulse 85, temperature 97.7 F (36.5 C), resp. rate 16, weight 82.5 kg, SpO2 99 %.   General: Alert and oriented x 3, No apparent distress HEENT: Head is normocephalic, atraumatic, PERRLA, EOMI, sclera anicteric, oral mucosa pink and moist, dentition intact, ext ear canals clear,  Neck: Supple without JVD or lymphadenopathy Heart: Reg rate and rhythm. No murmurs rubs or gallops Chest: CTA bilaterally without wheezes, rales, or rhonchi; no distress Abdomen: Soft, non-tender, non-distended, bowel sounds positive. Extremities: No clubbing, cyanosis, or edema. Pulses are 2+ Psych: Pt's affect is appropriate. Pt is cooperative Skin: Clean and intact without signs of breakdown Neuro:Speech clear. Keeps eyes turned to the right and able to move to left with some effort. Left lateral rectus weakness. Left central 7. Demonstrates almost a left inattention. Follows  commands. Altered Left finger to nose , decreased fine motor movement LLE. Strength 5/5 RUE and RLE and grossly 4 to 4+/5 LUE and LLE. Senses pain in all 4'. No resting tone. Musculoskeletal: No joint swelling or tenderness noted, ROM normal  Assessment/Plan: 1. Functional deficits which require 3+ hours per day of interdisciplinary therapy in a comprehensive inpatient rehab setting. Physiatrist is providing close team supervision and 24 hour management of active medical problems listed below. Physiatrist and rehab team continue to assess barriers to discharge/monitor patient progress toward functional and medical goals  Care Tool:  Bathing              Bathing assist       Upper Body Dressing/Undressing Upper body dressing  Upper body assist      Lower Body Dressing/Undressing Lower body dressing            Lower body assist       Toileting Toileting    Toileting assist       Transfers Chair/bed transfer  Transfers assist           Locomotion Ambulation   Ambulation assist              Walk 10 feet activity   Assist           Walk 50 feet activity   Assist           Walk 150 feet activity   Assist           Walk 10 feet on uneven surface  activity   Assist           Wheelchair     Assist               Wheelchair 50 feet with 2 turns activity    Assist            Wheelchair 150 feet activity     Assist          Blood pressure (!) 152/86, pulse 85, temperature 97.7 F (36.5 C), resp. rate 16, weight 82.5 kg, SpO2 99 %.  Medical Problem List and Plan: 1. Functional deficits secondary to right MCA distribution infarct, likely embolic             -patient may shower             -ELOS/Goals: 14 days, supervision goals  -Continue CIR PT/OT/SLP 2.  Antithrombotics: -DVT/anticoagulation:  Pharmaceutical: Lovenox             -antiplatelet therapy: DAPT X 3 months followed by  ASA 325 mg daily 3. Pain Management:  Tylenol prn.  4. Mood/Behavior/Sleep: LCSW to follow for evaluation and support.              --Trazodone prn for insomnia             -antipsychotic agents: N/A 5. Neuropsych/cognition: This patient is capable of making decisions on his own behalf. 6. Skin/Wound Care:  Routine pressure relief measures.  7. Fluids/Electrolytes/Nutrition: Monitor I/O. Check CMET in am. 8. HTN: Monitor BP TID--avoid hypotension with Right M2 occlusion.             --continue Cozaar, Coreg and HCTZ  --Will decrease HCTZ to 12.5mg  due to AKI 9. T2DM: Hgb A1c- 8.1 (down from 12). Will continue to monitor BS ac/hs             -continue Glipizide and metformin  -Continue to monitor trend  10. AKI  -Will decrease HCTZ to 12.5mg   -NS 50 ml/hr startedd 7/28, recheck BMET tomorrow 11. HLD  -Continue statin  LOS: 1 days A FACE TO FACE EVALUATION WAS PERFORMED  Fanny Dance 09/12/2021, 10:03 AM

## 2021-09-12 NOTE — Progress Notes (Signed)
Inpatient Rehabilitation Care Coordinator Assessment and Plan Patient Details  Name: BREYSON KELM MRN: [id number] Date of Birth: July 23, 1965  Today's Date: 09/12/2021  Hospital Problems: Principal Problem:   Acute stroke of medulla oblongata [facility])  Past Medical History:  Past Medical History:  Diagnosis Date   Anemia    Articular cartilage disorder of hip    Cerebral ischemia    Congenital deformity of right hip joint    Degeneration of cervical intervertebral disc    Diabetes mellitus without complication (HCC)    type 2   Fatigue    Hyperglycemia due to diabetes mellitus (HCC)    Hyperlipidemia    Hypertension    Hypocalcemia    Hyponatremia    Lower limb pain, inferior, right    Mild cognitive disorder    Pain in joint of left knee    Recurrent falls    Spondylolisthesis of lumbar region    Spondylolisthesis, lumbar region    Past Surgical History:  Past Surgical History:  Procedure Laterality Date   APPENDECTOMY  1970   Social History:  reports that he has never smoked. He does not have any smokeless tobacco history on file. He reports that he does not drink alcohol and does not use drugs.  Family / Support Systems Children: 56 Y.O son in the home Other Supports: Father Thoma), cousin in  home Anticipated Caregiver: [name] (S.O). Ability/Limitations of Caregiver: Supervision to [employer] A Caregiver Availability: 24/7 Family Dynamics: support from S.o, cousin and son  Social History Preferred language: English Religion:  Health Literacy - How often do you need to have someone help you when you read instructions, pamphlets, or other written material from your doctor or pharmacy?: Never Writes: Yes   Abuse/Neglect Abuse/Neglect Assessment Can Be Completed: Yes Physical Abuse: Denies Verbal Abuse: Denies Sexual Abuse: Denies Exploitation of patient/patient's resources: Denies Self-Neglect: Denies  Patient response to: Social Isolation - How often do you  feel lonely or isolated from those around you?: Never  Emotional Status Recent Psychosocial Issues: coping Psychiatric History: n/a Substance Abuse History: n/a  Patient / Family Perceptions, Expectations & Goals Pt/Family understanding of illness & functional limitations: yes Premorbid pt/family roles/activities: Independent and driving occasionally Anticipated changes in roles/activities/participation: S.O able to provide care up to [place], anticipating supervision. Able to provide 24/7 supervision Pt/family expectations/goals: [place]: None Premorbid Home Care/DME Agencies: Other (Comment) Financial trader) Transportation available at discharge: S.O able to transport Is the patient able to respond to transportation needs?: Yes In the past 12 months, has lack of transportation kept you from medical appointments or from getting medications?: No In the past 12 months, has lack of transportation kept you from meetings, work, or from getting things needed for daily living?: No  Discharge Planning Living Arrangements: Spouse/significant other, Children Support Systems: Spouse/significant other Type of Residence: Private residence (Patient lives in a 2 level home, able to stay on main level) Insurance Resources: Teacher, adult education Resources: Employment Museum/gallery curator Screen Referred: Yes Living Expenses: Education officer, community Management: Patient, Significant Other Does the patient have any problems obtaining your medications?: Yes (Describe) ([place] and S.O in process of reinstating insurance) Home Management: independent Patient/Family Preliminary Plans: Patient S.O able to assist with tasks if needed Care Coordinator Barriers to Discharge: Insurance for SNF coverage Expected length of stay: 14 Days  Clinical Impression Covering for primary SW, [place].  Sw met with patient and called S.O, Katina by phone introduced self and explained role. Patient anticipates  discharging home with S.O [name] as primary caregiver able to provide up to [place].Family able to provide 24/7 supervision from S.O, son and cousin in the home. Patient uninsured, S.O reports she has been working with a financial counseling and in the process of reinstating patient [employer].   Dyanne Iha 09/12/2021, 10:16 AM

## 2021-09-13 LAB — BASIC METABOLIC PANEL
Anion gap: 7 (ref 5–15)
BUN: 14 mg/dL (ref 6–20)
CO2: 28 mmol/L (ref 22–32)
Calcium: 8.8 mg/dL — ABNORMAL LOW (ref 8.9–10.3)
Chloride: 103 mmol/L (ref 98–111)
Creatinine, Ser: 1.02 mg/dL (ref 0.61–1.24)
GFR, Estimated: >60 mL/min (ref >60)
Glucose, Bld: 81 mg/dL (ref 70–99)
Potassium: 3.8 mmol/L (ref 3.5–5.1)
Sodium: 138 mmol/L (ref 135–145)

## 2021-09-13 LAB — GLUCOSE, CAPILLARY
Glucose-Capillary: 92 mg/dL (ref 70–99)
Glucose-Capillary: 101 mg/dL — ABNORMAL HIGH (ref 70–99)
Glucose-Capillary: 95 mg/dL (ref 70–99)
Glucose-Capillary: 148 mg/dL — ABNORMAL HIGH (ref 70–99)

## 2021-09-13 NOTE — Progress Notes (Signed)
PROGRESS NOTE   Subjective/Complaints: Doing ok  No new complaints this morning Labs stable this morning Cr normalized  Review of Systems  Constitutional:  Negative for chills and fever.  Eyes:  Positive for blurred vision.  Respiratory:  Negative for shortness of breath.   Cardiovascular:  Negative for chest pain.  Gastrointestinal:  Negative for abdominal pain, constipation, nausea and vomiting.  Musculoskeletal:  Positive for joint pain.  Neurological:  Positive for speech change and weakness.    Objective:   No results found. Recent Labs    09/11/21 1846  WBC 5.9  HGB 12.9*  HCT 38.2*  PLT 420*    Recent Labs    09/11/21 1846 09/13/21 0511  NA 136 138  K 3.8 3.8  CL 102 103  CO2 25 28  GLUCOSE 130* 81  BUN 17 14  CREATININE 1.29* 1.02  CALCIUM 8.7* 8.8*     Intake/Output Summary (Last 24 hours) at 09/13/2021 1350 Last data filed at 09/13/2021 1040 Gross per 24 hour  Intake 478 ml  Output 745 ml  Net -267 ml         Physical Exam: Vital Signs Blood pressure (!) 160/93, pulse 83, temperature 97.9 F (36.6 C), resp. rate 15, height 5\' 9"  (1.753 m), weight 82.5 kg, SpO2 100 %.  Gen: no distress, normal appearing HEENT: oral mucosa pink and moist, NCAT Cardio: Reg rate Chest: normal effort, normal rate of breathing Abd: soft, non-distended Ext: no edema Psych: Pt's affect is appropriate. Pt is cooperative Skin: Clean and intact without signs of breakdown Neuro:Speech clear. Keeps eyes turned to the right and able to move to left with some effort. Left lateral rectus weakness. Left central 7. Demonstrates almost a left inattention. Follows commands. Altered Left finger to nose , decreased fine motor movement LLE. Strength 5/5 RUE and RLE and grossly 4 to 4+/5 LUE and LLE. Senses pain in all 4'. No resting tone. Musculoskeletal: No joint swelling or tenderness noted, ROM  normal  Assessment/Plan: 1. Functional deficits which require 3+ hours per day of interdisciplinary therapy in a comprehensive inpatient rehab setting. Physiatrist is providing close team supervision and 24 hour management of active medical problems listed below. Physiatrist and rehab team continue to assess barriers to discharge/monitor patient progress toward functional and medical goals  Care Tool:  Bathing    Body parts bathed by patient: Right arm, Chest, Abdomen, Left arm, Front perineal area, Right upper leg, Left upper leg, Face   Body parts bathed by helper: Buttocks, Right lower leg, Left lower leg     Bathing assist Assist Level: Moderate Assistance - Patient 50 - 74%     Upper Body Dressing/Undressing Upper body dressing   What is the patient wearing?: Pull over shirt    Upper body assist Assist Level: Minimal Assistance - Patient > 75%    Lower Body Dressing/Undressing Lower body dressing      What is the patient wearing?: Pants, Incontinence brief     Lower body assist Assist for lower body dressing: Moderate Assistance - Patient 50 - 74%     Toileting Toileting    Toileting assist Assist for toileting: Maximal Assistance -  Patient 25 - 49%     Transfers Chair/bed transfer  Transfers assist     Chair/bed transfer assist level: Moderate Assistance - Patient 50 - 74%     Locomotion Ambulation   Ambulation assist      Assist level: 2 helpers Assistive device: Hand held assist Max distance: 50 feet   Walk 10 feet activity   Assist     Assist level: 2 helpers Assistive device: Hand held assist   Walk 50 feet activity   Assist    Assist level: 2 helpers Assistive device: Hand held assist    Walk 150 feet activity   Assist Walk 150 feet activity did not occur: Safety/medical concerns  Assist level:  (unable to perform due to strength, balance and coordiantion deficits)      Walk 10 feet on uneven surface   activity   Assist     Assist level: Maximal Assistance - Patient 25 - 49% Assistive device: Hand held assist, Other (comment) (handrail)   Wheelchair     Assist Is the patient using a wheelchair?: Yes Type of Wheelchair: Manual    Wheelchair assist level: Total Assistance - Patient < 25% Max wheelchair distance: 300 feet    Wheelchair 50 feet with 2 turns activity    Assist        Assist Level: Total Assistance - Patient < 25%   Wheelchair 150 feet activity     Assist      Assist Level: Total Assistance - Patient < 25%   Blood pressure (!) 160/93, pulse 83, temperature 97.9 F (36.6 C), resp. rate 15, height 5\' 9"  (1.753 m), weight 82.5 kg, SpO2 100 %.  Medical Problem List and Plan: 1. Functional deficits secondary to right MCA distribution infarct, likely embolic             -patient may shower             -ELOS/Goals: 14 days, supervision goals  -Continue CIR PT/OT/SLP 2.  Antithrombotics: -DVT/anticoagulation:  Pharmaceutical: Lovenox             -antiplatelet therapy: DAPT X 3 months followed by ASA 325 mg daily 3. Pain Management:  Tylenol prn.  4. Mood/Behavior/Sleep: LCSW to follow for evaluation and support.              --Trazodone prn for insomnia             -antipsychotic agents: N/A 5. Neuropsych/cognition: This patient is capable of making decisions on his own behalf. 6. Skin/Wound Care:  Routine pressure relief measures.  7. Fluids/Electrolytes/Nutrition: Monitor I/O. Check CMET in am. 8. HTN: Monitor BP TID--avoid hypotension with Right M2 occlusion.             --continue Cozaar, Coreg and HCTZ  --Will decrease HCTZ to 12.5mg  due to AKI 9. T2DM: Hgb A1c- 8.1 (down from 12). Will continue to monitor BS ac/hs             -continue Glipizide and metformin  -Continue to monitor trend  10. AKI  -Will decrease HCTZ to 12.5mg   -NS 50 ml/hr startedd 7/28, Cr reviewed and has normalized 11. HLD  -Continue statin  LOS: 2 days A  FACE TO FACE EVALUATION WAS PERFORMED  Rikki Trosper P Brithany Whitworth 09/13/2021, 1:50 PM

## 2021-09-14 LAB — GLUCOSE, CAPILLARY
Glucose-Capillary: 113 mg/dL — ABNORMAL HIGH (ref 70–99)
Glucose-Capillary: 95 mg/dL (ref 70–99)
Glucose-Capillary: 113 mg/dL — ABNORMAL HIGH (ref 70–99)
Glucose-Capillary: 75 mg/dL (ref 70–99)

## 2021-09-14 NOTE — Progress Notes (Signed)
Physical Therapy Session Note  Patient Details  Name: Shawn Austin MRN: [id number] Date of Birth: Apr 13, 1965  Today's Date: 09/14/2021 PT Individual Time: 7371-0626 PT Individual Time Calculation (min): 57 min   Short Term Goals: Week 1:  PT Short Term Goal 1 (Week 1): Pt will perform sit to stand with LRAD and min A PT Short Term Goal 2 (Week 1): Pt will perform stand pivot transfer with LRAD and mod A PT Short Term Goal 3 (Week 1): Pt will ambulate 50 ft with LRAD and mod A  Skilled Therapeutic Interventions/Progress Updates:    Chart reviewed and pt agreeable to therapy. Pt received semi-reclined in bed with no c/o pain. Session focused on balance training and LLE strengthening to promote functional return and independent mobility. Pt initiated session with sit to stand in STEDY using ModA and dependent transfer to [facility] with CGA for balance. At bedside, pt completed 7x sit to stand using ModA  + bed rails and focus on practicing upright position and postural control. Pt noted to have R lat lean in standing. Pt educated on accessing posture and correcting through visual cueing. Pt able to maintain upright posture with strong VC and reminders. Pt then transported to therapy gym and completed 5 mins on Kinetron to promote WB on LLE. Pt presented with B abd during seated marching but was able to correct with pillow squeeze between knees during marching. Pt then returned to room and completed SST transfer using ModA + RW. At end of session, pt was left semi-reclined in bed with NT present, alarm engaged, nurse call bell and all needs in reach.     Therapy Documentation Precautions:  Precautions Precautions: Fall Precaution Comments: R gaze preference, R lateral lean in sitting/standing Restrictions Weight Bearing Restrictions: No   Therapy/Group: Individual Therapy  Dionne Milo, PT, DPT 09/14/2021, 2:44 PM

## 2021-09-14 NOTE — IPOC Note (Signed)
Overall Plan of Care [facility]) Patient Details Name: Shawn Austin MRN: [id number] DOB: March 11, 1965  Admitting Diagnosis: Acute stroke of medulla oblongata [facility])  Hospital Problems: Principal Problem:   Acute stroke of medulla oblongata (HCC)     Functional Problem List: Nursing Bladder, Bowel, Medication Management, Safety, Endurance  PT Balance, Perception, Safety, Endurance, Sensory, Motor, Skin Integrity  OT Balance, Cognition, Endurance, Motor, Perception, Safety, Vision  SLP Behavior, Cognition, Safety  TR         Basic ADL's: OT Grooming, Bathing, Dressing, Toileting     Advanced  ADL's: OT       Transfers: PT Bed Mobility, Bed to Chair, Customer service manager, Tub/Shower     Locomotion: PT Ambulation, Psychologist, prison and probation services, Stairs     Additional Impairments: OT None  SLP Communication, Social Cognition   Social Interaction, Problem Solving, Memory, Attention, Awareness  TR      Anticipated Outcomes Item Anticipated Outcome  Self Feeding independent  Swallowing      Basic self-care  supervision  Toileting  supervision   Bathroom Transfers supervision  Bowel/Bladder  manage bowel and bladder w mod I assist  Transfers  Supervision  Locomotion  Supervision  Communication     Cognition  min A  Pain  n/a  Safety/Judgment  manage safety w cues   Therapy Plan: PT Intensity: Minimum of 1-2 x/day ,45 to 90 minutes PT Frequency: 5 out of 7 days PT Duration Estimated Length of Stay: 21 days OT Intensity: Minimum of 1-2 x/day, 45 to 90 minutes OT Frequency: 5 out of 7 days OT Duration/Estimated Length of Stay: 20-21 days SLP Intensity: Minumum of 1-2 x/day, 30 to 90 minutes SLP Frequency: 3 to 5 out of 7 days SLP Duration/Estimated Length of Stay: 3-3.5 weeks   Team Interventions: Nursing Interventions Patient/Family Education, Bowel Management, Discharge Planning, Medication Management, Disease Management/Prevention, Bladder Management  PT interventions  Ambulation/gait training, Discharge planning, DME/adaptive equipment instruction, Functional mobility training, Psychosocial support, Therapeutic Activities, UE/LE Strength taining/ROM, Visual/perceptual remediation/compensation, Warden/ranger, Community reintegration, Disease management/prevention, Neuromuscular re-education, Equities trader education, Museum/gallery curator, Therapeutic Exercise, UE/LE Coordination activities, Wheelchair propulsion/positioning  OT Interventions Warden/ranger, Cognitive remediation/compensation, Discharge planning, DME/adaptive equipment instruction, Neuromuscular re-education, Functional mobility training, Patient/family education, Psychosocial support, Self Care/advanced ADL retraining, Therapeutic Activities, Therapeutic Exercise, UE/LE Strength taining/ROM, UE/LE Coordination activities, Visual/perceptual remediation/compensation  SLP Interventions Cognitive remediation/compensation, Internal/external aids, Functional tasks, Patient/family education, Therapeutic Activities  TR Interventions    SW/CM Interventions Discharge Planning, Psychosocial Support, Patient/Family Education, Disease Management/Prevention   Barriers to Discharge MD  Medical stability  Nursing Decreased caregiver support, Home environment access/layout 2 level 5 ste 1/2 ba on main; 20 steps upstairs, w s.o.  PT Inaccessible home environment, Decreased caregiver support, Home environment access/layout, Lack of/limited family support decreased caregiver support, 2 story house with 7 steps to enter with 2 HR's and 20 steps with R rail to access bed/bathroom upstairs  OT Decreased caregiver support pt reports his wife works during the day  SLP Behavior pt became verbally agitated during evaluation; poor frustration tolerance  SW Community education officer for SNF coverage     Team Discharge Planning: Destination: PT-Home ,OT- Home , SLP-Home Projected Follow-up: PT-Home health PT, OT-   Outpatient OT, SLP-24 hour supervision/assistance, Home Health SLP Projected Equipment Needs: PT-To be determined, OT- Tub/shower bench, 3 in 1 bedside comode, SLP-None recommended by SLP Equipment Details: PT-no AD, OT-  Patient/family involved in discharge planning: PT- Patient,  OT-Patient, SLP-Patient  MD ELOS: 14 days Medical  Rehab Prognosis:  Excellent Assessment: The patient has been admitted for CIR therapies with the diagnosis of right MCA embolic stroke. The team will be addressing functional mobility, strength, stamina, balance, safety, adaptive techniques and equipment, self-care, bowel and bladder mgt, patient and caregiver education. Goals have been set at supervision. Anticipated discharge destination is home.        See Team Conference Notes for weekly updates to the plan of care

## 2021-09-14 NOTE — Progress Notes (Signed)
Physical Therapy Session Note  Patient Details  Name: Shawn Austin MRN: [id number] Date of Birth: 18-Sep-1965  Today's Date: 09/14/2021 PT Individual Time: 6606-3016 PT Individual Time Calculation (min): 60 min   Short Term Goals: Week 1:  PT Short Term Goal 1 (Week 1): Pt will perform sit to stand with LRAD and min A PT Short Term Goal 2 (Week 1): Pt will perform stand pivot transfer with LRAD and mod A PT Short Term Goal 3 (Week 1): Pt will ambulate 50 ft with LRAD and mod A  Skilled Therapeutic Interventions/Progress Updates:     Patient in recliner in the room upon PT arrival. Patient alert and agreeable to PT session. Patient denied pain during session.  Scheduled for vestibular evaluation today. Patient denies symptoms of dizziness, nausea, motion sensitivity, light headedness, or headache. Does report L eye "fogginess" with blurred vision. Notable L esotropia with increased effort for visual scanning to the L and seccadic intrusions with visual scanning and saccades in all directions. Patient with difficulty sustaining gaze with VOR x1, however asymptomatic. OT provided patient with exercise for horizontal saccades this morning, patient able to teach back and demonstrate exercise without cues. Encouraged patient to perform exercises 10x per day for 4x2 min bouts for improved oculomotor control. Patient in agreement. No further vestibular assessment performed due to lack of symptom presence. Please request further consult if symptoms occur.   Therapeutic Activity: Transfers: Patient performed stand pivot recliner<>w/c with mod A and x2 trials to the L due to strong R lean impairing safety on first trial and min-mod A to the R. He performed sit to/from with R rail x1 with max A due to encouraged R lean without L upper extremity support and min A x1 holding back of recliner with B upper extremity support. Provided verbal cues for forward and L weight shift, initiation, and hand placement.  Donned new incontinence brief with total A in standing in the room at end of session due to incontinence brief falling off during NMR. Performed standing balance with B upper extremity support with CGA throughout.  Neuromuscular Re-ed: Patient performed the following lower extremity motor control activities with a mirror for visual feedback: -sit to/from stand x6 in // bars focused on forward and L weight shift and hip/knee/trunk extension -weight shift with visual and tactile target of L hip to L bar to improve weight shift to L x10 -forward/backward ambulation 7x10 feet with B upper extremity support in the // bars progressing from min A to CGA and heavy R lean with hip on bar to midline with patient self correcting R lean without cues using mirror feedback  Patient in recliner in the room at end of session with breaks locked, seat belt alarm set, and all needs within reach.   Therapy Documentation Precautions:  Precautions Precautions: Fall Precaution Comments: R gaze preference, R lateral lean in sitting/standing Restrictions Weight Bearing Restrictions: No    Therapy/Group: Individual Therapy  Godric Lavell L Hudson Lehmkuhl PT, DPT, NCS, CBIS  09/14/2021, 12:48 PM

## 2021-09-14 NOTE — Progress Notes (Signed)
PROGRESS NOTE   Subjective/Complaints: No new complaints this morning Working well with therapy  Review of Systems  Constitutional:  Negative for chills and fever.  Eyes:  Positive for blurred vision.  Respiratory:  Negative for shortness of breath.   Cardiovascular:  Negative for chest pain.  Gastrointestinal:  Negative for abdominal pain, constipation, nausea and vomiting.  Musculoskeletal:  Positive for joint pain.  Neurological:  Positive for speech change and weakness.    Objective:   No results found. Recent Labs    09/11/21 1846  WBC 5.9  HGB 12.9*  HCT 38.2*  PLT 420*    Recent Labs    09/11/21 1846 09/13/21 0511  NA 136 138  K 3.8 3.8  CL 102 103  CO2 25 28  GLUCOSE 130* 81  BUN 17 14  CREATININE 1.29* 1.02  CALCIUM 8.7* 8.8*     Intake/Output Summary (Last 24 hours) at 09/14/2021 1359 Last data filed at 09/14/2021 0700 Gross per 24 hour  Intake 590 ml  Output 250 ml  Net 340 ml         Physical Exam: Vital Signs Blood pressure (!) 167/94, pulse 97, temperature 98.2 F (36.8 C), temperature source Oral, resp. rate 18, height 5\' 9"  (1.753 m), weight 82.5 kg, SpO2 100 %.  Gen: no distress, normal appearing HEENT: oral mucosa pink and moist, NCAT Cardio: Reg rate Chest: normal effort, normal rate of breathing Abd: soft, non-distended Ext: no edema Psych: Pt's affect is appropriate. Pt is cooperative Skin: Clean and intact without signs of breakdown Neuro:Speech clear. Keeps eyes turned to the right and able to move to left with some effort. Left lateral rectus weakness. Left central 7. Demonstrates almost a left inattention. Follows commands. Altered Left finger to nose , decreased fine motor movement LLE. Strength 5/5 RUE and RLE and grossly 4 to 4+/5 LUE and LLE. Senses pain in all 4'. No resting tone. Musculoskeletal: No joint swelling or tenderness noted, ROM  normal  Assessment/Plan: 1. Functional deficits which require 3+ hours per day of interdisciplinary therapy in a comprehensive inpatient rehab setting. Physiatrist is providing close team supervision and 24 hour management of active medical problems listed below. Physiatrist and rehab team continue to assess barriers to discharge/monitor patient progress toward functional and medical goals  Care Tool:  Bathing    Body parts bathed by patient: Right arm, Chest, Abdomen, Left arm, Front perineal area, Right upper leg, Left upper leg, Face   Body parts bathed by helper: Buttocks, Right lower leg, Left lower leg     Bathing assist Assist Level: Moderate Assistance - Patient 50 - 74%     Upper Body Dressing/Undressing Upper body dressing   What is the patient wearing?: Pull over shirt    Upper body assist Assist Level: Minimal Assistance - Patient > 75%    Lower Body Dressing/Undressing Lower body dressing      What is the patient wearing?: Pants, Incontinence brief     Lower body assist Assist for lower body dressing: Moderate Assistance - Patient 50 - 74%     Toileting Toileting    Toileting assist Assist for toileting: Maximal Assistance - Patient 25 -  49%     Transfers Chair/bed transfer  Transfers assist     Chair/bed transfer assist level: Moderate Assistance - Patient 50 - 74%     Locomotion Ambulation   Ambulation assist      Assist level: 2 helpers Assistive device: Hand held assist Max distance: 50 feet   Walk 10 feet activity   Assist     Assist level: 2 helpers Assistive device: Hand held assist   Walk 50 feet activity   Assist    Assist level: 2 helpers Assistive device: Hand held assist    Walk 150 feet activity   Assist Walk 150 feet activity did not occur: Safety/medical concerns  Assist level:  (unable to perform due to strength, balance and coordiantion deficits)      Walk 10 feet on uneven surface   activity   Assist     Assist level: Maximal Assistance - Patient 25 - 49% Assistive device: Hand held assist, Other (comment) (handrail)   Wheelchair     Assist Is the patient using a wheelchair?: Yes Type of Wheelchair: Manual    Wheelchair assist level: Total Assistance - Patient < 25% Max wheelchair distance: 300 feet    Wheelchair 50 feet with 2 turns activity    Assist        Assist Level: Total Assistance - Patient < 25%   Wheelchair 150 feet activity     Assist      Assist Level: Total Assistance - Patient < 25%   Blood pressure (!) 167/94, pulse 97, temperature 98.2 F (36.8 C), temperature source Oral, resp. rate 18, height 5\' 9"  (1.753 m), weight 82.5 kg, SpO2 100 %.  Medical Problem List and Plan: 1. Functional deficits secondary to right MCA distribution infarct, likely embolic             -patient may shower             -ELOS/Goals: 14 days, supervision goals  Continue CIR PT/OT/SLP 2.  Antithrombotics: -DVT/anticoagulation:  Pharmaceutical: Lovenox             -antiplatelet therapy: DAPT X 3 months followed by ASA 325 mg daily 3. Pain Management:  Tylenol prn.  4. Mood/Behavior/Sleep: LCSW to follow for evaluation and support.              --Trazodone prn for insomnia             -antipsychotic agents: N/A 5. Neuropsych/cognition: This patient is capable of making decisions on his own behalf. 6. Skin/Wound Care:  Routine pressure relief measures.  7. Fluids/Electrolytes/Nutrition: Monitor I/O. Check CMET in am. 8. HTN: Monitor BP TID--avoid hypotension with Right M2 occlusion.             --continue Cozaar, Coreg and HCTZ  --Will decrease HCTZ to 12.5mg  due to AKI 9. T2DM: Hgb A1c- 8.1 (down from 12). Will continue to monitor BS ac/hs             continue Glipizide and metformin  -Continue to monitor trend  10. AKI  -Will decrease HCTZ to 12.5mg   -NS 50 ml/hr startedd 7/28, Cr reviewed and has normalized 11. HLD  -continue  statin  LOS: 3 days A FACE TO FACE EVALUATION WAS PERFORMED  8/28 Nanna Ertle 09/14/2021, 1:59 PM

## 2021-09-14 NOTE — Progress Notes (Signed)
Occupational Therapy Session Note  Patient Details  Name: Shawn Austin MRN: [id number] Date of Birth: November 27, 1965   Session 1  Today's Date: 09/14/2021 OT Individual Time: 1601-0932 OT Individual Time Calculation (min): 45 min   Session 2  Today's Date: 09/14/2021 OT Individual Time: 3557-3220 OT Individual Time Calculation (min): 41 min    Short Term Goals: Week 1:  OT Short Term Goal 1 (Week 1): Pt will be able to complete squat pivot transfers to toilet with min A. OT Short Term Goal 2 (Week 1): Pt will be able to stand from toilet and hold balance with min A while he pulls pants over hips. OT Short Term Goal 3 (Week 1): Pt will don a shirt with supervision demonstrating improved coordination. OT Short Term Goal 4 (Week 1): Pt will scan to L environment with min cues or less.  Skilled Therapeutic Interventions/Progress Updates:    Session 1  Pt received supine with no c/o pain, agreeable to OT session. He transferred to EOB with min A, cueing for proprioception and body mechanics. He sat EOB and ate breakfast with skilled cueing/intervention required for R lateral lean, R visual scanning/head turn, and adaptive eating techniques to compensate for low vision. Frequent full LOB R seated. No concerns from a swallowing standpoint. RUE ataxic during self feeding. Pt very receptive to all cueing/education. Brief visual assessment- L eye with medial gaze deviation, even worse when isolated. Instructed pt in saccades practice and made him a handout to address this. He completed a sit > stand from EOB with min A and light mod to pivot to the recliner. He was unaware of incontinent BM and urine. He stood at the sink with min A and was able to maintain stand with BUE support on the sink with CGA while OT provided hygiene d/t mess. Pt was left sitting up in the recliner with all needs met, chair alarm set, and call bell within reach.    Session 2 Pt received sitting in the recliner with no c/o  pain and agreeable to OT session. He completed a stand pivot transfer from the recliner to the w/c toward the R side with max A to recover full LOB to the R, including activation of what appeared to be R hip tone and poor positioning of the RUE. OT also had to quickly react to not lose peripheral IV as pt grabbed a hold of the line by accident. He was taken via wc to the therapy gym. Mod A stand pivot transfer with much better control to the L side. From EOM worked on functional reaching task to challenge dynamic sitting balance, visual perception, RUE NMR, and trunk control. He required min-mod cueing throughout for body mechanics and positioning. He had fair sitting balance throughout activity. Next worked on trunk control with facilitated lateral leans grading activity up/down as needed with changing UE support. This required more effort and pt required close CGA, occasional min A. He returned to his w/c via stand pivot, mod A toward the L. Pt was left sitting up in the recliner with all needs met, chair alarm set, and call bell within reach.       Therapy Documentation Precautions:  Precautions Precautions: Fall Precaution Comments: R gaze preference, R lateral lean in sitting/standing Restrictions Weight Bearing Restrictions: No   Therapy/Group: Individual Therapy  [name] 09/14/2021, 6:45 AM

## 2021-09-15 LAB — BASIC METABOLIC PANEL
Anion gap: 8 (ref 5–15)
BUN: 11 mg/dL (ref 6–20)
CO2: 21 mmol/L — ABNORMAL LOW (ref 22–32)
Calcium: 8.9 mg/dL (ref 8.9–10.3)
Chloride: 110 mmol/L (ref 98–111)
Creatinine, Ser: 0.86 mg/dL (ref 0.61–1.24)
GFR, Estimated: >60 mL/min (ref >60)
Glucose, Bld: 93 mg/dL (ref 70–99)
Potassium: 4 mmol/L (ref 3.5–5.1)
Sodium: 139 mmol/L (ref 135–145)

## 2021-09-15 LAB — CBC
HCT: 38.7 % — ABNORMAL LOW (ref 39.0–52.0)
Hemoglobin: 12.9 g/dL — ABNORMAL LOW (ref 13.0–17.0)
MCH: 27.9 pg (ref 26.0–34.0)
MCHC: 33.3 g/dL (ref 30.0–36.0)
MCV: 83.6 fL (ref 80.0–100.0)
Platelets: 433 10*3/uL — ABNORMAL HIGH (ref 150–400)
RBC: 4.63 MIL/uL (ref 4.22–5.81)
RDW: 12.8 % (ref 11.5–15.5)
WBC: 6.7 10*3/uL (ref 4.0–10.5)
nRBC: 0 % (ref 0.0–0.2)

## 2021-09-15 LAB — GLUCOSE, CAPILLARY
Glucose-Capillary: 96 mg/dL (ref 70–99)
Glucose-Capillary: 83 mg/dL (ref 70–99)
Glucose-Capillary: 82 mg/dL (ref 70–99)
Glucose-Capillary: 64 mg/dL — ABNORMAL LOW (ref 70–99)
Glucose-Capillary: 79 mg/dL (ref 70–99)

## 2021-09-15 MED ORDER — HYDROCHLOROTHIAZIDE 25 MG PO TABS
25.0000 mg | ORAL_TABLET | Freq: Every day | ORAL | Status: DC
  Administered 2021-09-16 – 2021-09-30 (×15): 25 mg via ORAL
  Filled 2021-09-15 (×15): qty 1

## 2021-09-15 NOTE — Progress Notes (Signed)
PROGRESS NOTE   Subjective/Complaints: He reports no new complaints or concerns this AM. Working with PT.  AKI improved. LBM today.  Review of Systems  Constitutional:  Negative for chills and fever.  Eyes:  Positive for blurred vision. Negative for double vision.  Respiratory:  Negative for shortness of breath.   Cardiovascular:  Negative for chest pain and palpitations.  Gastrointestinal:  Negative for abdominal pain, constipation, nausea and vomiting.  Musculoskeletal:  Positive for joint pain.  Neurological:  Positive for speech change and weakness.    Objective:   No results found. Recent Labs    09/15/21 0541  WBC 6.7  HGB 12.9*  HCT 38.7*  PLT 433*    Recent Labs    09/13/21 0511 09/15/21 0541  NA 138 139  K 3.8 4.0  CL 103 110  CO2 28 21*  GLUCOSE 81 93  BUN 14 11  CREATININE 1.02 0.86  CALCIUM 8.8* 8.9     Intake/Output Summary (Last 24 hours) at 09/15/2021 1426 Last data filed at 09/15/2021 1230 Gross per 24 hour  Intake 1517.39 ml  Output 875 ml  Net 642.39 ml         Physical Exam: Vital Signs Blood pressure (!) 139/91, pulse 88, temperature 97.7 F (36.5 C), temperature source Oral, resp. rate 17, height 5\' 9"  (1.753 m), weight 82.5 kg, SpO2 100 %.  Gen: no distress, normal appearing HEENT: oral mucosa pink and moist, NCAT Cardio: Reg rate Chest: CTAB, normal effort, normal rate of breathing Abd: soft, non-distended Ext: no edema Psych: Pt's affect is appropriate. Pt is cooperative Skin: Clean and intact without signs of breakdown Neuro:Speech clear. Keeps eyes turned to the right and able to move to left with some effort. Left lateral rectus weakness. Left central 7. Demonstrates almost a left inattention. Follows commands. Altered Left finger to nose , decreased fine motor movement LLE. Strength 5/5 RUE and RLE and grossly 4 to 4+/5 LUE and LLE. Senses pain in all 4'. No resting  tone. Musculoskeletal: No joint swelling or tenderness noted, ROM normal  Assessment/Plan: 1. Functional deficits which require 3+ hours per day of interdisciplinary therapy in a comprehensive inpatient rehab setting. Physiatrist is providing close team supervision and 24 hour management of active medical problems listed below. Physiatrist and rehab team continue to assess barriers to discharge/monitor patient progress toward functional and medical goals  Care Tool:  Bathing    Body parts bathed by patient: Right arm, Chest, Abdomen, Left arm, Front perineal area, Right upper leg, Left upper leg, Face   Body parts bathed by helper: Buttocks, Right lower leg, Left lower leg     Bathing assist Assist Level: Moderate Assistance - Patient 50 - 74%     Upper Body Dressing/Undressing Upper body dressing   What is the patient wearing?: Pull over shirt    Upper body assist Assist Level: Minimal Assistance - Patient > 75%    Lower Body Dressing/Undressing Lower body dressing      What is the patient wearing?: Pants, Incontinence brief     Lower body assist Assist for lower body dressing: Moderate Assistance - Patient 50 - 74%  Toileting Toileting    Toileting assist Assist for toileting: Maximal Assistance - Patient 25 - 49%     Transfers Chair/bed transfer  Transfers assist     Chair/bed transfer assist level: Maximal Assistance - Patient 25 - 49%     Locomotion Ambulation   Ambulation assist      Assist level: 2 helpers Assistive device: Hand held assist Max distance: 50 feet   Walk 10 feet activity   Assist     Assist level: 2 helpers Assistive device: Hand held assist   Walk 50 feet activity   Assist    Assist level: 2 helpers Assistive device: Hand held assist    Walk 150 feet activity   Assist Walk 150 feet activity did not occur: Safety/medical concerns  Assist level:  (unable to perform due to strength, balance and coordiantion  deficits)      Walk 10 feet on uneven surface  activity   Assist     Assist level: Maximal Assistance - Patient 25 - 49% Assistive device: Hand held assist, Other (comment) (handrail)   Wheelchair     Assist Is the patient using a wheelchair?: Yes Type of Wheelchair: Manual    Wheelchair assist level: Total Assistance - Patient < 25% Max wheelchair distance: 300 feet    Wheelchair 50 feet with 2 turns activity    Assist        Assist Level: Total Assistance - Patient < 25%   Wheelchair 150 feet activity     Assist      Assist Level: Total Assistance - Patient < 25%   Blood pressure (!) 139/91, pulse 88, temperature 97.7 F (36.5 C), temperature source Oral, resp. rate 17, height 5\' 9"  (1.753 m), weight 82.5 kg, SpO2 100 %.  Medical Problem List and Plan: 1. Functional deficits secondary to right MCA distribution infarct, likely embolic             -patient may shower             -ELOS/Goals: 14 days, supervision goals  Continue CIR PT/OT/SLP 2.  Antithrombotics: -DVT/anticoagulation:  Pharmaceutical: Lovenox             -antiplatelet therapy: DAPT X 3 months followed by ASA 325 mg daily 3. Pain Management:  Tylenol prn.  4. Mood/Behavior/Sleep: LCSW to follow for evaluation and support.              --Trazodone prn for insomnia             -antipsychotic agents: N/A 5. Neuropsych/cognition: This patient is capable of making decisions on his own behalf. 6. Skin/Wound Care:  Routine pressure relief measures.  7. Fluids/Electrolytes/Nutrition: Monitor I/O. Check CMET in am. 8. HTN: Monitor BP TID--avoid hypotension with Right M2 occlusion.             --continue Cozaar, Coreg and HCTZ  --Will decrease HCTZ to 12.5mg  due to AKI  -7/31 will increase HCTZ back to 25mg  daily 9. T2DM: Hgb A1c- 8.1 (down from 12). Will continue to monitor BS ac/hs             continue Glipizide and metformin  -Well controlled, continue to monitor for now  10.  AKI  -Will decrease HCTZ to 12.5mg   -NS 50 ml/hr startedd 7/28, Cr reviewed and has normalized  -Will DC fluids 7/31, recheck labs on Wednesday 8/2 11. HLD  -continue statin  LOS: 4 days A FACE TO FACE EVALUATION WAS PERFORMED  8/31 09/15/2021, 2:26  PM

## 2021-09-15 NOTE — Progress Notes (Signed)
Physical Therapy Session Note  Patient Details  Name: Shawn Austin MRN: [id number] Date of Birth: 05-Jun-1965  Today's Date: 09/15/2021 PT Individual Time: 1500-1613 PT Individual Time Calculation (min): 73 min   Short Term Goals: Week 1:  PT Short Term Goal 1 (Week 1): Pt will perform sit to stand with LRAD and min A PT Short Term Goal 2 (Week 1): Pt will perform stand pivot transfer with LRAD and mod A PT Short Term Goal 3 (Week 1): Pt will ambulate 50 ft with LRAD and mod A  Skilled Therapeutic Interventions/Progress Updates:     Pt received supine in bed and agrees to therapy. No complaint of pain. Pt perform supine to sit with CGA and cues for sequencing and positioning at EOB. Pt perform stand pivot to WC with modA/maxA and cues for sequencing, hand placement, and initiation. Pt dons both shoes slowly with setup assistance from PT while seated in WC. WC transport to gym for time management. Pt performs sit to stand to RW with modA and cues for hand placement and body mechanics. Pt noted to have been incontinent of bowel. PT cues pt to ambulate with RW to restroom. Pt requires heavy maxA for gait x45', with heavy R sided lean and COG outside base of support. Pt able to correct partially when standing still with multimodal cueing, but returns to requiring maxA as soon as gait is reinitiated. Pt transfers to toilet with maxA and cues for hand placement and sequencing. Following toileting, pt stands with grab bars and modA and remains standing with modA/maxA while PT performs pericare and dons clean brief. MaxA for gait x30' with RW to Nustep. Pt completes 15:00 on Nustep at workload of 5 with average steps per minute ~40. PT cues pt to attend to L side during activity, as pt has heavy R gaze preference. Performed for reciprocal coordination training. Pt performs squat pivot from Nustep to [facility] with minA/modA and cues for hand placement, initiation, and sequencing. WC transport back to room. Pt  performs squat pivot to bed with minA/modA and same cues. Left supine with alarm intact and all needs within reach.  Therapy Documentation Precautions:  Precautions Precautions: Fall Precaution Comments: R gaze preference, R lateral lean in sitting/standing Restrictions Weight Bearing Restrictions: No   Therapy/Group: Individual Therapy  Beau Fanny, PT, DPT 09/15/2021, 5:31 PM

## 2021-09-15 NOTE — Progress Notes (Signed)
Occupational Therapy Session Note  Patient Details  Name: Shawn Austin MRN: [id number] Date of Birth: [birth year]  Today's Date: 09/15/2021 OT Individual Time: 1030-1130 OT Individual Time Calculation (min): 60 min    Short Term Goals: Week 1:  OT Short Term Goal 1 (Week 1): Pt will be able to complete squat pivot transfers to toilet with min A. OT Short Term Goal 2 (Week 1): Pt will be able to stand from toilet and hold balance with min A while he pulls pants over hips. OT Short Term Goal 3 (Week 1): Pt will don a shirt with supervision demonstrating improved coordination. OT Short Term Goal 4 (Week 1): Pt will scan to L environment with min cues or less.  Skilled Therapeutic Interventions/Progress Updates:    Pt received in recliner ready for therapy.  Pt connected to IV so did his therapy in his room. Focus of session on L eye motor control with visual tracking exercises, BUE coordination with ball reaching and fast cone stacking activities, and truncal motor control with dynamic reaching to his L for trunk elongation, trunk twists, forward reach.  Pt needed frequent verbal cues and tactile cues to put more weight through LLE or L buttock for pelvic symmetry.  Decreased R lean and improved visual scanning today.  R gaze preference minimal compared to admission.  He also worked on sets of chair pushups with tactile cues to shift to LLE more to avoid pushing to his R.  Taught pt how to adjust his recliner.  Discussed pt's concerns of how his 33 yo son is feeling scared or nervous to "see me like this".  Discussed strategies of reaching out to son to reassure him. Pt resting in recliner with all needs met. Belt alarm on.   Therapy Documentation Precautions:  Precautions Precautions: Fall Precaution Comments: R gaze preference, R lateral lean in sitting/standing Restrictions Weight Bearing Restrictions: No     Pain: Pain Assessment Pain Score: 0-No pain ADL: ADL Eating: Set  up Grooming: Setup Where Assessed-Grooming: Sitting at sink Upper Body Bathing: Supervision/safety, Minimal cueing Where Assessed-Upper Body Bathing: Chair Lower Body Bathing: Moderate cueing, Moderate assistance Where Assessed-Lower Body Bathing: Chair Upper Body Dressing: Minimal assistance Where Assessed-Upper Body Dressing: Chair Lower Body Dressing: Moderate assistance Where Assessed-Lower Body Dressing: Chair Toileting: Maximal assistance Where Assessed-Toileting: Glass blower/designer: Moderate assistance Toilet Transfer Method: Squat pivot Toilet Transfer Equipment: Bedside commode (BSC over toilet)   Therapy/Group: Individual Therapy  Ocie Tino 09/15/2021, 12:29 PM

## 2021-09-15 NOTE — Progress Notes (Signed)
Inpatient Rehabilitation Care Coordinator Assessment and Plan Patient Details  Name: Shawn Austin MRN: [id number] Date of Birth: Mar 22, 1965  Today's Date: 09/15/2021  Hospital Problems: Principal Problem:   Acute stroke of medulla oblongata [facility])  Past Medical History:  Past Medical History:  Diagnosis Date   Anemia    Articular cartilage disorder of hip    Cerebral ischemia    Congenital deformity of right hip joint    Degeneration of cervical intervertebral disc    Diabetes mellitus without complication (HCC)    type 2   Fatigue    Hyperglycemia due to diabetes mellitus (HCC)    Hyperlipidemia    Hypertension    Hypocalcemia    Hyponatremia    Lower limb pain, inferior, right    Mild cognitive disorder    Pain in joint of left knee    Recurrent falls    Spondylolisthesis of lumbar region    Spondylolisthesis, lumbar region    Past Surgical History:  Past Surgical History:  Procedure Laterality Date   APPENDECTOMY  1970   BUBBLE STUDY  09/11/2021   Procedure: BUBBLE STUDY;  Surgeon: Wendall Stade, MD;  Location: [facility] ENDOSCOPY;  Service: Cardiovascular;;   TEE WITHOUT CARDIOVERSION N/A 09/11/2021   Procedure: TRANSESOPHAGEAL ECHOCARDIOGRAM (TEE);  Surgeon: Wendall Stade, MD;  Location: [facility] ENDOSCOPY;  Service: Cardiovascular;  Laterality: N/A;   Social History:  reports that he has never smoked. He does not have any smokeless tobacco history on file. He reports that he does not drink alcohol and does not use drugs.  Family / Support Systems Marital Status: Single Patient Roles: Partner, Parent, Other (Comment) (son) Spouse/Significant Other: [name] [phone] Children: 21 yo son Other Supports: [place]-father  [phone] Anticipated Caregiver: [name] Ability/Limitations of Caregiver: Works so during the day not covered-Dad has visual issues and according to pt will not be helping him Caregiver Availability: Evenings only Family Dynamics: Family will do what they can  but he will not have 24/7 care at [place]. He hopes to do well and recover from his two strokes.  Social History Preferred language: English Religion:  Cultural Background: No issues Education: HS Health Literacy - How often do you need to have someone help you when you read instructions, pamphlets, or other written material from your doctor or pharmacy?: Never Writes: Yes Employment Status: Unemployed Date Retired/Disabled/Unemployed: applied for SSD still pending Legal History/Current Legal Issues: No issues Guardian/Conservator: None-acording to MD pt is capable of making his own decisions while here, not fully aware of his deficits though   Abuse/Neglect Abuse/Neglect Assessment Can Be Completed: Yes Physical Abuse: Denies Verbal Abuse: Denies Sexual Abuse: Denies Exploitation of patient/patient's resources: Denies Self-Neglect: Denies  Patient response to: Social Isolation - How often do you feel lonely or isolated from those around you?: Often  Emotional Status Pt's affect, behavior and adjustment status: Pt is motivated to recover and be able to manage himself, he has never been in a situation like this and does not like asking for help. He wants to take care of his own needs Recent Psychosocial Issues: recent CVA and more deficits then after his first one. He has always been able to take care of himself and now can not. He is worried about his finances also since has not worked since being laid off Psychiatric History: No issues Substance Abuse History: No issues  Patient / Family Perceptions, Expectations & Goals Pt/Family understanding of illness & functional limitations: Pt is able to explain his stroke ut  seems to not be aware of his neglect, he talks with the MD and hopes he will recover from this Premorbid pt/family roles/activities: partner, brother, son, father, retiree, etc Anticipated changes in roles/activities/participation: resume Pt/family expectations/goals: Pt  states: " I want to take care of myself, my girlfriend works and needs to for our bills."  [employer]: None Premorbid Home Care/DME Agencies: None Transportation available at discharge: Partner Is the patient able to respond to transportation needs?: Yes In the past 12 months, has lack of transportation kept you from medical appointments or from getting medications?: No In the past 12 months, has lack of transportation kept you from meetings, work, or from getting things needed for daily living?: No Resource referrals recommended: Neuropsychology  Discharge Planning Living Arrangements: Spouse/significant other, Children Support Systems: Spouse/significant other, Children, Parent, Other relatives, Friends/neighbors Type of Residence: Private residence Insurance Resources: Customer service manager Resources: Family Support Financial Screen Referred: Yes Living Expenses: Rent Money Management: Significant Other Does the patient have any problems obtaining your medications?: Yes (Describe) Home Management: self and partner Patient/Family Preliminary Plans: Return home with partner assisting with his care but she is gone during the day at work. Will await goals and work on best and safest plan for him. Their son is in summer camp until school starts. Care Coordinator Barriers to Discharge: Lack of/limited family support, Decreased caregiver support, Insurance for SNF coverage Care Coordinator Anticipated Follow Up Needs: HH/OP Expected length of stay: 14 Days  Clinical Impression Pleasant gentleman who is motivated to do well and recover from his two CVA's. His partner is supportive and father can call him but has health issues of his own. Will work on safest plan for him for discharge, but limited options due to uninsured.  Lucy Chris 09/15/2021, 11:27 AM

## 2021-09-15 NOTE — Progress Notes (Signed)
Speech Language Pathology Daily Session Note  Patient Details  Name: Shawn Austin MRN: [id number] Date of Birth: 10/06/1965  Today's Date: 09/15/2021 SLP Individual Time: 0804-0900 SLP Individual Time Calculation (min): 56 min  Short Term Goals: Week 1: SLP Short Term Goal 1 (Week 1): Patient will sustain attention to functional tasks for 5 minute intervals with mod A verbal redirection SLP Short Term Goal 2 (Week 1): Patient will complete basic problem solving tasks with max A verbal/visual cues to achieve 75% accuracy SLP Short Term Goal 3 (Week 1): Patient will utilize external memory aids to recall functional information with mod A verbal cues for effectiveness SLP Short Term Goal 4 (Week 1): Patient will demonstrate awareness to errors and repair with max A verbal cues SLP Short Term Goal 5 (Week 1): Pt will visually scan left during functional tasks with max A verbal cues during 75% of opportunities  Skilled Therapeutic Interventions: Skilled ST treatment focused on cognitive goals. Pt was received in bed on arrival reporting he needed to be changed from loose bowel incontinence. Pt stated he called out using his call bell however there was no evidence of this upon entry therefore unable to confirm. SLP reviewed use of call button. Pt requiring mod A verbal/visual cues for locating buttons d/t visual deficits. SLP facilitated transfer X2 to bathroom using Stedy and to manage IV pole. Provided peri care with mod A for thoroughness; donned clean brief; returned to recliner for remainder of session. SLP facilitated orientation with min A for use of external aid; pt requiring at least mod-to-max A for visual scanning left. Wrote today's therapy schedule down on paper with large print. Pt verbally read with 75% accuracy and consistently omitted words on far left hand side. Used pink highlighter to emphasize left side of paper. Pt able to locate with sup A verbal cues. Pt was able to sustain  attention to today's therapy tasks for 5 minute intervals with min A verbal redirection, much improved from initial evaluation. Patient was left in bed with alarm activated and immediate needs within reach at end of session. Continue per current plan of care.      Pain  None/denied  Therapy/Group: Individual Therapy  Ardelia Wrede T Nikoloz Huy 09/15/2021, 9:00 AM

## 2021-09-15 NOTE — Progress Notes (Signed)
Physical Therapy Session Note  Patient Details  Name: Shawn Austin MRN: [id number] Date of Birth: [birth year]  Today's Date: 09/15/2021 PT Individual Time: 7253-6644 PT Individual Time Calculation (min): 54 min   Short Term Goals: Week 1:  PT Short Term Goal 1 (Week 1): Pt will perform sit to stand with LRAD and min A PT Short Term Goal 2 (Week 1): Pt will perform stand pivot transfer with LRAD and mod A PT Short Term Goal 3 (Week 1): Pt will ambulate 50 ft with LRAD and mod A  Skilled Therapeutic Interventions/Progress Updates:      Therapy Documentation Precautions:  Precautions Precautions: Fall Precaution Comments: R gaze preference, R lateral lean in sitting/standing Restrictions Weight Bearing Restrictions: No  Pt received seated in recliner at bedside and declines pain. Pt requires mod A with sit to stand and max A with stand pivot transfer to w/c. Pt transferred to main gym via w/c for time management and energy conservation. Pt participated in blocked practice of sit to stand transfers and required mod A initially and progressed to min A. Pt requires verbal, visual (mirror) and tactile cues for AD management and weight shift to the left side with transfer. Transitioned to bridging to facilitate closed chain posterior chain strengthening and equal weightbearing in LE's. Pt is CGA with rolling, supine to sit, and sit to supine on mat table. Pt performed 3 sets of 8 gluteal bridges with tactile cueing at lateral knees to recruit hip abductors. Pt transitioned to blocked practice of stand pivot transfers in bilateral directions from mat<>w/c. Pt requires max A to perform with tactile and verbal cues for LE sequencing, motor planning and RW management. Pt transported from main gym to hospital room and left seated in recliner at bedside with chair alarm on and all needs within reach.    Therapy/Group: Individual Therapy  Truitt Leep 09/15/2021, 7:41 AM

## 2021-09-16 LAB — GLUCOSE, CAPILLARY
Glucose-Capillary: 90 mg/dL (ref 70–99)
Glucose-Capillary: 106 mg/dL — ABNORMAL HIGH (ref 70–99)
Glucose-Capillary: 119 mg/dL — ABNORMAL HIGH (ref 70–99)
Glucose-Capillary: 166 mg/dL — ABNORMAL HIGH (ref 70–99)

## 2021-09-16 NOTE — Progress Notes (Signed)
Hypoglycemic Event  CBG: 64  Treatment: 8 oz juice/soda  Symptoms: None  Follow-up CBG: Time: 2203 CBG Result: 79   Possible Reasons for Event: Unknown  Comments/MD notified: hypoglycemic protocol initiated     Shawn Austin

## 2021-09-16 NOTE — Progress Notes (Addendum)
Physical Therapy Session Note  Patient Details  Name: Shawn Austin MRN: [id number] Date of Birth: 04-Jan-1966  Today's Date: 09/16/2021 PT Individual Time: 8144-8185 PT Individual Time Calculation (min): 47 min   Short Term Goals: Week 1:  PT Short Term Goal 1 (Week 1): Pt will perform sit to stand with LRAD and min A PT Short Term Goal 2 (Week 1): Pt will perform stand pivot transfer with LRAD and mod A PT Short Term Goal 3 (Week 1): Pt will ambulate 50 ft with LRAD and mod A  Skilled Therapeutic Interventions/Progress Updates:      Therapy Documentation Precautions:  Precautions Precautions: Fall Precaution Comments: R gaze preference, R lateral lean in sitting/standing Restrictions Weight Bearing Restrictions: No  Pt received seated in w/c at bedside and declines pain. Pt transported to main gym in w/c for time management. Emphasis on lateral weight shifting to the left to prepare for gait in therapy session. In parallel bars pt performed sit to stand with min A and requires verbal cues for UE placement with transfer. Pt practiced stepping forward with left LE to facilitate left side weight shift as pt presents with significant right side lean. Pt requires tactile cueing at hip and visual cueing via mirror to maintain midline. Pt performed step up with right LE on to 3 inch step to increase stance time and weight shift to left LE. Pt ambulated approximately 8 feet in parallel bars with mod A and visual and verbal cueing to facilitate left weight shift and adequate step length. Pt required max A for stand pivot to bed and supervision with sit to supine. Pt left semi-reclined in bed with bed alarm on and all needs within reach.   Therapy/Group: Individual Therapy  Truitt Leep 09/16/2021, 7:42 AM

## 2021-09-16 NOTE — Progress Notes (Signed)
PROGRESS NOTE   Subjective/Complaints: In bed this AM. No new concerns.  Had a low CBG of 64 last night.   Review of Systems  Constitutional:  Negative for chills and fever.  Eyes:  Positive for blurred vision. Negative for double vision.  Respiratory:  Negative for shortness of breath.   Cardiovascular:  Negative for chest pain and palpitations.  Gastrointestinal:  Negative for abdominal pain, constipation, nausea and vomiting.  Musculoskeletal:  Positive for joint pain.  Neurological:  Positive for speech change and weakness.    Objective:   No results found. Recent Labs    09/15/21 0541  WBC 6.7  HGB 12.9*  HCT 38.7*  PLT 433*    Recent Labs    09/15/21 0541  NA 139  K 4.0  CL 110  CO2 21*  GLUCOSE 93  BUN 11  CREATININE 0.86  CALCIUM 8.9     Intake/Output Summary (Last 24 hours) at 09/16/2021 0751 Last data filed at 09/16/2021 0730 Gross per 24 hour  Intake 480 ml  Output 350 ml  Net 130 ml         Physical Exam: Vital Signs Blood pressure (!) 160/91, pulse 88, temperature 98 F (36.7 C), temperature source Oral, resp. rate 16, height 5\' 9"  (1.753 m), weight 82.5 kg, SpO2 100 %.  Gen: no distress, normal appearing, in bed HEENT: oral mucosa pink and moist, NCAT Cardio: Reg rate Chest: CTAB, normal effort, normal rate of breathing Abd: soft, non-distended, + BS Ext: no edema Psych: Pt's affect is appropriate. Pt is cooperative Skin: Clean and intact without signs of breakdown Neuro:Speech clear. Keeps eyes turned to the right and able to move to left with some effort. Left lateral rectus weakness. Left central 7. Demonstrates almost a left inattention. Follows commands. Altered Left finger to nose , decreased fine motor movement LLE. Strength 5/5 RUE and RLE and grossly 4 to 4+/5 LUE and LLE. Senses pain in all 4'. No resting tone. Musculoskeletal: No joint swelling or tenderness noted, ROM  normal  Assessment/Plan: 1. Functional deficits which require 3+ hours per day of interdisciplinary therapy in a comprehensive inpatient rehab setting. Physiatrist is providing close team supervision and 24 hour management of active medical problems listed below. Physiatrist and rehab team continue to assess barriers to discharge/monitor patient progress toward functional and medical goals  Care Tool:  Bathing    Body parts bathed by patient: Right arm, Chest, Abdomen, Left arm, Front perineal area, Right upper leg, Left upper leg, Face   Body parts bathed by helper: Buttocks, Right lower leg, Left lower leg     Bathing assist Assist Level: Moderate Assistance - Patient 50 - 74%     Upper Body Dressing/Undressing Upper body dressing   What is the patient wearing?: Pull over shirt    Upper body assist Assist Level: Minimal Assistance - Patient > 75%    Lower Body Dressing/Undressing Lower body dressing      What is the patient wearing?: Pants, Incontinence brief     Lower body assist Assist for lower body dressing: Moderate Assistance - Patient 50 - 74%      assist Assist for toileting: Maximal Assistance - Patient 25 - 49%     Transfers Chair/bed transfer  Transfers assist     Chair/bed transfer assist level: Maximal Assistance - Patient 25 - 49%     Locomotion Ambulation   Ambulation assist      Assist level: 2 helpers Assistive device: Hand held assist Max distance: 50 feet   Walk 10 feet activity   Assist     Assist level: 2 helpers Assistive device: Hand held assist   Walk 50 feet activity   Assist    Assist level: 2 helpers Assistive device: Hand held assist    Walk 150 feet activity   Assist Walk 150 feet activity did not occur: Safety/medical concerns  Assist level:  (unable to perform due to strength, balance and coordiantion deficits)      Walk 10 feet on uneven surface   activity   Assist     Assist level: Maximal Assistance - Patient 25 - 49% Assistive device: Hand held assist, Other (comment) (handrail)   Wheelchair     Assist Is the patient using a wheelchair?: Yes Type of Wheelchair: Manual    Wheelchair assist level: Total Assistance - Patient < 25% Max wheelchair distance: 300 feet    Wheelchair 50 feet with 2 turns activity    Assist        Assist Level: Total Assistance - Patient < 25%   Wheelchair 150 feet activity     Assist      Assist Level: Total Assistance - Patient < 25%   Blood pressure (!) 160/91, pulse 88, temperature 98 F (36.7 C), temperature source Oral, resp. rate 16, height 5\' 9"  (1.753 m), weight 82.5 kg, SpO2 100 %.  Medical Problem List and Plan: 1. Functional deficits secondary to right MCA distribution infarct, likely embolic             -patient may shower             -ELOS/Goals: 14 days, supervision goals  Continue CIR PT/OT/SLP  -Improved visual scanning to the left 2.  Antithrombotics: -DVT/anticoagulation:  Pharmaceutical: Lovenox             -antiplatelet therapy: DAPT X 3 months followed by ASA 325 mg daily 3. Pain Management:  Tylenol prn.  4. Mood/Behavior/Sleep: LCSW to follow for evaluation and support.              --Trazodone prn for insomnia             -antipsychotic agents: N/A 5. Neuropsych/cognition: This patient is capable of making decisions on his own behalf. 6. Skin/Wound Care:  Routine pressure relief measures.  7. Fluids/Electrolytes/Nutrition: Monitor I/O. Check CMET in am. 8. HTN: Monitor BP TID--avoid hypotension with Right M2 occlusion.             --continue Cozaar, Coreg and HCTZ  --Will decrease HCTZ to 12.5mg  due to AKI  -7/31 will increase HCTZ back to 25mg  daily  8/1 Improved, monitor trend 9. T2DM: Hgb A1c- 8.1 (down from 12). Will continue to monitor BS ac/hs             continue Glipizide and metformin  -Well controlled, continue to monitor for  now  -8/1 DC glipizide  10. AKI  -Will decrease HCTZ to 12.5mg   -NS 50 ml/hr startedd 7/28, Cr reviewed and has normalized  -Will DC fluids 7/31, recheck labs on Wednesday 8/2  -BMET tomorrow 11. HLD  -continue statin  LOS: 5  days A FACE TO FACE EVALUATION WAS PERFORMED  Fanny Dance 09/16/2021, 7:51 AM

## 2021-09-16 NOTE — Progress Notes (Signed)
Speech Language Pathology Daily Session Note  Patient Details  Name: Shawn Austin MRN: [id number] Date of Birth: 1965-10-11  Today's Date: 09/16/2021 SLP Individual Time: 3664-4034 SLP Individual Time Calculation (min): 45 min  Short Term Goals: Week 1: SLP Short Term Goal 1 (Week 1): Patient will sustain attention to functional tasks for 5 minute intervals with mod A verbal redirection SLP Short Term Goal 2 (Week 1): Patient will complete basic problem solving tasks with max A verbal/visual cues to achieve 75% accuracy SLP Short Term Goal 3 (Week 1): Patient will utilize external memory aids to recall functional information with mod A verbal cues for effectiveness SLP Short Term Goal 4 (Week 1): Patient will demonstrate awareness to errors and repair with max A verbal cues SLP Short Term Goal 5 (Week 1): Pt will visually scan left during functional tasks with max A verbal cues during 75% of opportunities  Skilled Therapeutic Interventions: Pt seen for skilled ST with focus on cognitive goals, pt in bed in the dark requiring minimal extra time to rouse for participation in therapeutic tasks. SLP facilitating simple problem ID/solution generating task with picture stimuli requiring mod-max A verbal cues for accuracy. Suspect visual/scanning impairments impacting problem ID, however when SLP clarifying problematic scenarios pt continued to require mod-max verbal cues to produce safe solutions. Pt denies taking any prescription medications at home or any baseline medical diagnoses, SLP providing list of current medications and discussion rationale for meds and current administration times. Pt was interested in education on medication at this time however is limited in recall and carryover of information d/t poor attention and storage of new information. Re-educated patient on use of call button for all needs/mobility, pt verbalized understanding. Pt left in bed with alarm set and all needs within  reach. Cont ST POC.   Pain Pain Assessment Pain Scale: 0-10 Pain Score: 0-No pain  Therapy/Group: Individual Therapy  Tacey Ruiz 09/16/2021, 9:28 AM

## 2021-09-16 NOTE — Progress Notes (Signed)
Occupational Therapy Session Note  Patient Details  Name: Shawn Austin MRN: [id number] Date of Birth: [birth year]  Today's Date: 09/16/2021 OT Individual Time: 5852-7782 OT Individual Time Calculation (min): 76 min    Short Term Goals: Week 1:  OT Short Term Goal 1 (Week 1): Pt will be able to complete squat pivot transfers to toilet with min A. OT Short Term Goal 2 (Week 1): Pt will be able to stand from toilet and hold balance with min A while he pulls pants over hips. OT Short Term Goal 3 (Week 1): Pt will don a shirt with supervision demonstrating improved coordination. OT Short Term Goal 4 (Week 1): Pt will scan to L environment with min cues or less.  Skilled Therapeutic Interventions/Progress Updates:  Pt awake seated in w/c upon OT arrival to the room. Pt reports, "I don't have any trouble with it (LUE)," OT noted ataxic movements in LUE when pt applies mask and asks pt. Pt in agreement for OT session.  Therapy Documentation Precautions:  Precautions Precautions: Fall Precaution Comments: R gaze preference, R lateral lean in sitting/standing Restrictions Weight Bearing Restrictions: No Vital Signs: Please see "Flowsheet" for most recent vitals charted by nursing staff.  Pain: Pain Assessment Pain Scale: 0-10 Pain Score: 0-No pain  ADL: Grooming: Supervision/safety (Pt able to complete hand hygiene with supervision while seated in the w/c. Pt requires minimal VC's to locate soap on R side.) Where Assessed-Grooming: Wheelchair Toileting: Maximal assistance (Pt reports need to void bladder. Pt transported back to room to utilize urinal. Pt requires maximal assistance to place urinal, however, pt able to manage clothing to void bladder.) Where Assessed-Toileting: Wheelchair ADL Comments: Pt requires minimal VC's to locate ADL tools on R side. However, once pt returned back to room at end of session pt intentionally placed call light on L side independently with pt report of  trying to improve awareness.  BUE Endurance: Pt participates in w/c mobility in order to improve BUE strength, endurance, and coordination skills which are needed to improve independence with ADL's and mobility. Pt able to propel w/c from room <> therapy gym >100' with close supervision - minimal assistance due to pt veering to the R side of the hallway increasing safety concerns with RUE between the wheel and the wall. Pt reports minimal fatigue with w/c propulsion.  Visual Scanning and Motor Planning: (BITS) Pt participates in a visual perceptual and multi-step command following activity using the BITS while seated in w/c with decreased posterior support from backrest (encouraged to lean forward during activity). Pt participates in the user paced visual scanning activity. Pt encouraged to use BUE to locate and touch the moving object in order to improve BUE motor control and crossing midline. Pt alternates between using RUE and LUE when experiencing fatigue. Pt frequently crosses midline using RUE and LUE. Pt requires no VC's to visually scan to L side. However, does demo increased time to locate moving object when in L visual field. Pt able to maintain sitting balance without posterior back support form w/c with intermittent CGA when reaching further outside of BOS on R side. Pt demo's R lateral leaning. Pt's results form the user paced central fixation activity are listed below. 3/4# wrist weight donned on 2nd and 3rd trial to provide proprioceptive input to attempt to decrease ataxic movements. Pt noted improved motor control with wrist weights donned. Pt requires rest breaks in between trials due to increased fatigue. Pt completes 2 minutes x 3 trails with the following  results:  Trial 1: 70% accuracy, 3.39 sec reaction time, 35 hits Trial 2 (wrist weights donned): 82.69% accuracy, 2.78 sec reaction time, 43 hits Trial 3 (wrist weights donned): 93.48% accuracy, 2.70 sec reaction time, 43 hits  Pt  reports no visual fatigue or s/s of a headache after BITS activity.  Transfer Training: Pt participates in transfer training in order to improve safety awareness, carry-over into functional tasks, and strength needed to complete functional transfers with improve safety and independence. Prior to transfers, education provided to pt on proper transfer techniques such as positioning of w/c, importance of locking brakes, scooting to the edge of the surface, importance of anterior weight shifting, and pushing up/reaching for stable surface during sit <> stand. Pt able to complete a squat-pivot transfer to R from w/c <> edge of mat with moderate assistance on the 1st trial and maximal assistance on the 2nd trial due to fatigue. Pt then able to perform 5 sit <> stand transfers from edge of mat with minimal assistance and minimal VC's to use proper transfer techniques. Pt does demo 1 LOB in sitting when attempting to correct posture to more upright. Pt was noted to over compensate and demo'd a posterior LOB requiring maximal assistance to correct. Education provided to pt on how CVA impacts motor control and proprioception skill which can cause some over compensatory or difficulty with motor planning with functional tasks. Pt verbalizes understanding. After 2nd squat-pivot transfer (pt required increased assistance), pt reports "Want to try it again?" OT provided education on importance of rest breaks in order to maintain safety and decrease fall risks throughout rehab. Encouraged pt that rest breaks can maximize outcomes and assist with progression. Pt verbalizes understanding.    Pt requested to stay in the w/c at end of session. Pt left sitting comfortably in the w/c with personal belongings and call light within reach (pt placed on L to improve L side awareness), belt alarm placed and activated, and comfort needs attended to. Placed bedside table in front of pt to prepare for lunch. NT in room to check blood sugar  at end of session.    Therapy/Group: Individual Therapy  Lavera Guise 09/16/2021, 12:17 PM

## 2021-09-16 NOTE — Progress Notes (Signed)
Occupational Therapy Session Note  Patient Details  Name: Shawn Austin MRN: [id number] Date of Birth: 09/19/1965  Today's Date: 09/16/2021 OT Individual Time: 8295-6213 OT Individual Time Calculation (min): 42 min    Short Term Goals: Week 1:  OT Short Term Goal 1 (Week 1): Pt will be able to complete squat pivot transfers to toilet with min A. OT Short Term Goal 2 (Week 1): Pt will be able to stand from toilet and hold balance with min A while he pulls pants over hips. OT Short Term Goal 3 (Week 1): Pt will don a shirt with supervision demonstrating improved coordination. OT Short Term Goal 4 (Week 1): Pt will scan to L environment with min cues or less.  Skilled Therapeutic Interventions/Progress Updates:    Pt received in bed stating he was already wash up very thoroughly by nursing as he had vomited this morning.  Pt did not have clothing, so told pt to remain laying in bed while I would retrieve scrub he could use.  Just after leaving room, bed alarm went off and pt tried to sit on EOB.  Explained that I need him to stay fully in bed until I get back.  Once back in room, pt able to sit to EOB with close S and maintained sitting balance with no leaning.  Pt completed a squat pivot to his L to wc with min -mod A.  Noted his brief was soiled, which pt not aware of.  Used stedy lift to have pt stand to allow for me to assist him with thorough cleansing.   Pt donned pants over feet with mod A and then continued to use stedy throughout most of the session to facilitate standing, postural control in standing. Pt stood in stedy with CGA and then was able to pull his pants up. Placed stedy at sink so pt could stand to brush his teeth but pt preferred using pads to stay in a perched position.   Then pt did a lot of work working on his midline standing control using the sink mirror for feedback (using stedy for support).  Left his shirt off so he could see his alignment better. Pt did extremely  well with this activity and responded well to cues to shift to his L when he started to lean to the R.  He also worked stand to squats.  He has improved a great deal with L visual field awareness and is scanning his eyes to the left with only occasional cues.  Pt sat back in wc and able to don shirt with S and no cues needed.  Belt alarm on and all needs met.  Call light in reach.  Therapy Documentation Precautions:  Precautions Precautions: Fall Precaution Comments: R gaze preference, R lateral lean in sitting/standing Restrictions Weight Bearing Restrictions: No Pain: Pain Assessment Pain Scale: 0-10 Pain Score: 0-No pain    Therapy/Group: Individual Therapy  Zareth Rippetoe 09/16/2021, 12:39 PM

## 2021-09-17 LAB — GLUCOSE, CAPILLARY
Glucose-Capillary: 99 mg/dL (ref 70–99)
Glucose-Capillary: 148 mg/dL — ABNORMAL HIGH (ref 70–99)
Glucose-Capillary: 160 mg/dL — ABNORMAL HIGH (ref 70–99)
Glucose-Capillary: 132 mg/dL — ABNORMAL HIGH (ref 70–99)

## 2021-09-17 LAB — BASIC METABOLIC PANEL
Anion gap: 7 (ref 5–15)
BUN: 11 mg/dL (ref 6–20)
CO2: 25 mmol/L (ref 22–32)
Calcium: 8.8 mg/dL — ABNORMAL LOW (ref 8.9–10.3)
Chloride: 106 mmol/L (ref 98–111)
Creatinine, Ser: 0.93 mg/dL (ref 0.61–1.24)
GFR, Estimated: >60 mL/min (ref >60)
Glucose, Bld: 136 mg/dL — ABNORMAL HIGH (ref 70–99)
Potassium: 3.8 mmol/L (ref 3.5–5.1)
Sodium: 138 mmol/L (ref 135–145)

## 2021-09-17 NOTE — Patient Care Conference (Signed)
Inpatient RehabilitationTeam Conference and Plan of Care Update Date: 09/17/2021   Time: 12:18 PM    Patient Name: Shawn Austin      Medical Record Number: [id number]  Date of Birth: March 01, 1965 Sex: Male         Room/Bed: 4M10C/4M10C-01 Payor Info: Payor: /    Admit Date/Time:  09/11/2021  4:02 PM  Primary Diagnosis:  Acute stroke of medulla oblongata [facility])  Hospital Problems: Principal Problem:   Acute stroke of medulla oblongata [facility])    Expected Discharge Date: Expected Discharge Date: 10/07/21  Team Members Present: Physician leading conference: Dr. Fanny Dance Social Worker Present: Dossie Der, LCSW Nurse Present: Kennyth Arnold, RN PT Present: Other (comment) Gaye Pollack, PT) OT Present: Valetta Fuller, OT SLP Present: Feliberto Gottron, SLP PPS Coordinator present : Fae Pippin, SLP     Current Status/Progress Goal Weekly Team Focus  Bowel/Bladder   incontinent B/B  regain continence  time toilet a 2-3 hr and prn   Swallow/Nutrition/ Hydration             ADL's   supervision UB self care, mod A LB self care, still dealing with incontinence, mod A squat pivot transfers, min sit to stand, decreased postural control with R lean, blurry vision but improving L eye tracking and L visual awareness  supervision overall  ADL training, postural control, coordination, visual tracking   Mobility   rolling supervision, sup<>sit CGA, STS: mod, stand pivot: max, 8 ft gait in // bars  supervision overall  bed mobility, transfers, gait, pre-gait activities (L weight shifting and LE advancement)   Communication             Safety/Cognition/ Behavioral Observations  mod-max A  Min A  awareness, problem solving, functional memory, sustained attention   Pain   no pain reported  < 3  assess pain q 4 hr and prn   Skin   CDI  no breakdown  assess skin q shift and prn     Discharge Planning:  Attempting to confirm DC plan-pt voiced home with girlfriend works during the day  and will be alone. Father pt reports can not assist-elderly and visual issues   Team Discussion: Complaints concerning lab draw. Monitoring BP, CBG's running low, Glipizide discontinued. Incontinent B/B, no pain reported. Discontinued IVF. Unsure of discharge plan at this time. Reports sensitivity to light, not headache.   Patient on target to meet rehab goals: yes, supervision goals. Currently supervision with RW, CGA STS, mod assist SP. Walked 82ft in parallel bars. Supervision upper body, mod assist lower body, min/mod assist squat pivot. Working on cognition with mod/max assist goals for attention and memory.    *See Care Plan and progress notes for long and short-term goals.   Revisions to Treatment Plan:  Adjusting medications, working on discharge plans and location   Teaching Needs: Family education, medication management, bowel/bladder management, safety awareness, transfer/gait training, etc.   Current Barriers to Discharge: Decreased caregiver support, Home enviroment access/layout, Incontinence, Lack of/limited family support, Insurance for SNF coverage, and discharge location  Possible Resolutions to Barriers: Family education Determine discharge location     Medical Summary Current Status: R MCA CVA, HTN, T2DM,AKI, HLD  Barriers to Discharge: Decreased family/caregiver support;Home enviroment access/layout;Incontinence  Barriers to Discharge Comments: R MCA CVA, HTN, T2DM,AKI, HLD Possible Resolutions to [employer] Focus: Monitor BP, monitor CBG, monitor BMET, encourage oral fluids   Continued Need for Acute Rehabilitation Level of Care: The patient requires daily medical management by a  physician with specialized training in physical medicine and rehabilitation for the following reasons: Direction of a multidisciplinary physical rehabilitation program to maximize functional independence : Yes Medical management of patient stability for increased activity during  participation in an intensive rehabilitation regime.: Yes Analysis of laboratory values and/or radiology reports with any subsequent need for medication adjustment and/or medical intervention. : Yes   I attest that I was present, lead the team conference, and concur with the assessment and plan of the team.   Tennis Must 09/17/2021, 7:46 PM

## 2021-09-17 NOTE — Progress Notes (Signed)
Physical Therapy Session Note  Patient Details  Name: Shawn Austin MRN: [id number] Date of Birth: [birth year]  Today's Date: 09/17/2021 PT Individual Time: 0915-1013 PT Individual Time Calculation (min): 58 min &   Short Term Goals: Week 1:  PT Short Term Goal 1 (Week 1): Pt will perform sit to stand with LRAD and min A PT Short Term Goal 2 (Week 1): Pt will perform stand pivot transfer with LRAD and mod A PT Short Term Goal 3 (Week 1): Pt will ambulate 50 ft with LRAD and mod A    Skilled Therapeutic Interventions/Progress Updates:      Therapy Documentation Precautions:  Precautions Precautions: Fall Precaution Comments: R gaze preference, R lateral lean in sitting/standing Restrictions Weight Bearing Restrictions: No  Treatment Session 1:   Pt received seated in w/c at bedside with lights dimmed and declines pain but reports increased light sensitivity. Pt propelled w/c supervision 157 feet to ortho gym with bilateral UE. Pt performed sit to stand and stand pivot to mat from w/c with mod A. Pt participated in blocked practice of sit to stands to address postural control, strength, activity tolerances and motor control deficits. Initially pt performed sit to stand with R LE placed on 3 inch step to emphasize L LE weight bearing and weight shift with sit to stand ( 1 x 5). Pt transitioned to sit to stand transfers with min-mod A from PT. PT educated pt regarding sequencing steps for transfer and pt able to verbalize and perform. Pt required visual (mirror), verbal, and tactile cueing to facilitate anterior weight shift and left lateral weight shift in standing position. In standing position pt requires max A for dynamic balance with reaching R UE across midline for target on L side. Pt propelled w/c supervision 157 feet to room and left seated in w/c at bedside with chair alarm on and all needs within reach.   Treatment Session 2:   Pt received seated in w/c at bedside and declines  pain. Pt appears emotional and upset regarding discharge date as he would like to go home soon. PT provided emotional support and encouraged pt that his medical team wants to increase time spent at rehab to help him meet his goals. Pt appeared more positive following conversation. Pt propelled w/c ~100 ft with bilateral UE and PT transported patient additional distance to main gym for time management and energy conservation. Pt positioned in standing frame with R LE on top of 3 inch step to encourage lateral weightbearing and weight shift to the left. In standing frame pt required tactile cueing at trunk to facilitate midline posture while patient engaged in connect four. Game was placed in patient's left visual field to increase awareness. Transitioned to sit to stand practice on mat table and in parallel bars to encourage carryover from lateral weight shifting in standing frame. In parallel bars pt initially utilized bilateral UE on bars  and progressed to unilateral support of L UE on parallel bar. Pt performed sit to stand with staggered stance ( L LE backward) to increase post chain activation with transfer. Pt initiated steps and ambulated forwards/ backwards in parallel bars ~40 ft. Pt ambulated total distance of 55 ft with 3 Musketeer technique and requires verbal and tactile cueing for lateral left weight shifting with gait training. Pt propelled w/c ~100 ft and PT transported patient additional distance to hospital room. Pt requires min A with sit to stand and urinated in standing position with CGA. Pt requires supervision with sit  to supine and left semi-reclined in bed with bed alarm on and all needs within reach.    Therapy/Group: Individual Therapy  Truitt Leep Truitt Leep PT, DPT  09/17/2021, 7:37 AM

## 2021-09-17 NOTE — Progress Notes (Signed)
Patient ID: Shawn Austin, male   DOB: 01-May-1965, 56 y.o.   MRN: [id number]  Spoke with katina-girlfriend to update her regarding team conference goals of supervision level and target discharge date of 8/22. She reports it is important he stay and get all of the rehab he can due to he needs it and he needs to make himself the priority right now. Met with pt to also update and he was asking to go to a family reunion next week and told him he could not if he was still a patient here. They can come to him and he can get a grounds pass to see them. He is ok with this. [name] will bring in clothes and his sunglasses when she comes tonight. She reports she can work from home and have cousin stay with him while she works her part time job for [employer]. So he will have someone with him at home. Will work on discharge needs and have neuro-psych see [name] felt it would be helpful.

## 2021-09-17 NOTE — Progress Notes (Addendum)
PROGRESS NOTE   Subjective/Complaints: Reports he didn't like getting blood drawn this AM. No new concerns or complaints otherwise.  Review of Systems  Constitutional:  Negative for chills and fever.  Eyes:  Positive for blurred vision. Negative for double vision.  Respiratory:  Negative for shortness of breath.   Cardiovascular:  Negative for chest pain and palpitations.  Gastrointestinal:  Negative for abdominal pain, constipation, nausea and vomiting.  Musculoskeletal:  Positive for joint pain.  Neurological:  Positive for speech change and weakness.    Objective:   No results found. Recent Labs    09/15/21 0541  WBC 6.7  HGB 12.9*  HCT 38.7*  PLT 433*    Recent Labs    09/15/21 0541 09/17/21 0758  NA 139 138  K 4.0 3.8  CL 110 106  CO2 21* 25  GLUCOSE 93 136*  BUN 11 11  CREATININE 0.86 0.93  CALCIUM 8.9 8.8*     Intake/Output Summary (Last 24 hours) at 09/17/2021 1104 Last data filed at 09/17/2021 0803 Gross per 24 hour  Intake 476 ml  Output --  Net 476 ml         Physical Exam: Vital Signs Blood pressure (!) 177/97, pulse 82, temperature 97.8 F (36.6 C), resp. rate 16, height 5\' 9"  (1.753 m), weight 82.5 kg, SpO2 100 %.  Gen: no distress, normal appearing, in bed HEENT: oral mucosa pink and moist, NCAT Cardio: Reg rate Chest: CTAB, normal effort, normal rate of breathing Abd: soft, non-distended, + BS Ext: no edema Psych: Pt's affect is appropriate. Pt is cooperative Skin: warm and dry Neuro:Speech clear. Keeps eyes turned to the right and able to move to left with some effort. Left lateral rectus weakness. Left central 7. Demonstrates almost a left inattention-improved. Follows commands. Altered Left finger to nose , decreased fine motor movement LLE. Strength 5/5 RUE and RLE and grossly 4 to 4+/5 LUE and LLE. Senses pain in all 4'. No resting tone. Musculoskeletal: No joint swelling or  tenderness noted, ROM normal  Assessment/Plan: 1. Functional deficits which require 3+ hours per day of interdisciplinary therapy in a comprehensive inpatient rehab setting. Physiatrist is providing close team supervision and 24 hour management of active medical problems listed below. Physiatrist and rehab team continue to assess barriers to discharge/monitor patient progress toward functional and medical goals  Care Tool:  Bathing    Body parts bathed by patient: Right arm, Chest, Abdomen, Left arm, Front perineal area, Right upper leg, Left upper leg, Face   Body parts bathed by helper: Buttocks, Right lower leg, Left lower leg     Bathing assist Assist Level: Moderate Assistance - Patient 50 - 74%     Upper Body Dressing/Undressing Upper body dressing   What is the patient wearing?: Pull over shirt    Upper body assist Assist Level: Supervision/Verbal cueing    Lower Body Dressing/Undressing Lower body dressing      What is the patient wearing?: Pants, Incontinence brief     Lower body assist Assist for lower body dressing: Moderate Assistance - Patient 50 - 74%     Toileting Toileting    Toileting assist Assist for toileting:  Maximal Assistance - Patient 25 - 49%     Transfers Chair/bed transfer  Transfers assist     Chair/bed transfer assist level: Maximal Assistance - Patient 25 - 49%     Locomotion Ambulation   Ambulation assist      Assist level: Moderate Assistance - Patient 50 - 74% Assistive device: Parallel bars Max distance: 8 feet   Walk 10 feet activity   Assist     Assist level: 2 helpers Assistive device: Hand held assist   Walk 50 feet activity   Assist    Assist level: 2 helpers Assistive device: Hand held assist    Walk 150 feet activity   Assist Walk 150 feet activity did not occur: Safety/medical concerns  Assist level:  (unable to perform due to strength, balance and coordiantion deficits)      Walk 10  feet on uneven surface  activity   Assist     Assist level: Maximal Assistance - Patient 25 - 49% Assistive device: Hand held assist, Other (comment) (handrail)   Wheelchair     Assist Is the patient using a wheelchair?: Yes Type of Wheelchair: Manual    Wheelchair assist level: Total Assistance - Patient < 25% Max wheelchair distance: 300 feet    Wheelchair 50 feet with 2 turns activity    Assist        Assist Level: Total Assistance - Patient < 25%   Wheelchair 150 feet activity     Assist      Assist Level: Total Assistance - Patient < 25%   Blood pressure (!) 177/97, pulse 82, temperature 97.8 F (36.6 C), resp. rate 16, height 5\' 9"  (1.753 m), weight 82.5 kg, SpO2 100 %.  Medical Problem List and Plan: 1. Functional deficits secondary to right MCA distribution infarct, likely embolic             -patient may shower             -ELOS/Goals: 14 days, supervision goals  Continue CIR PT/OT/SLP  -Improved visual scanning to the left  -Team conference today 2.  Antithrombotics: -DVT/anticoagulation:  Pharmaceutical: Lovenox             -antiplatelet therapy: DAPT X 3 months followed by ASA 325 mg daily 3. Pain Management:  Tylenol prn.  4. Mood/Behavior/Sleep: LCSW to follow for evaluation and support.              --Trazodone prn for insomnia             -antipsychotic agents: N/A 5. Neuropsych/cognition: This patient is capable of making decisions on his own behalf. 6. Skin/Wound Care:  Routine pressure relief measures.  7. Fluids/Electrolytes/Nutrition: Monitor I/O. Check CMET in am. 8. HTN: Monitor BP TID--avoid hypotension with Right M2 occlusion.             --continue Cozaar, Coreg and HCTZ  --Will decrease HCTZ to 12.5mg  due to AKI  -7/31 will increase HCTZ back to 25mg  daily  8/1 Improved, monitor trend  -8/2 Episodic elevation, continue to monitor 9. T2DM: Hgb A1c- 8.1 (down from 12). Will continue to monitor BS ac/hs              continue Glipizide and metformin  -Well controlled, continue to monitor for now  -8/1 DC glipizide  -8/2 CBGs well controlled  10. AKI  -Will decrease HCTZ to 12.5mg   -NS 50 ml/hr startedd 7/28, Cr reviewed and has normalized  -Will DC fluids 7/31, recheck labs on  Wednesday 8/2  -Scr 0.93 8/2, stable, monitor with weekly labs 11. HLD  -continue statin  LOS: 6 days A FACE TO FACE EVALUATION WAS PERFORMED  Jennye Boroughs 09/17/2021, 11:04 AM

## 2021-09-17 NOTE — Progress Notes (Signed)
Physical Therapy Session Note  Patient Details  Name: Shawn Austin MRN: [id number] Date of Birth: 02/02/1966  Today's Date: 09/17/2021 PT Individual Time: 0832-0858 PT Individual Time Calculation (min): 26 min   Short Term Goals: Week 1:  PT Short Term Goal 1 (Week 1): Pt will perform sit to stand with LRAD and min A PT Short Term Goal 2 (Week 1): Pt will perform stand pivot transfer with LRAD and mod A PT Short Term Goal 3 (Week 1): Pt will ambulate 50 ft with LRAD and mod A  Skilled Therapeutic Interventions/Progress Updates:      I sleeping in bed on arrival, awakens to voice and is agreeable to therapy with some light encouragement. Some time spent building rapport to assist with pt buy in and participation. Supine<>sitting EOB with supervision with HOB flat, extra time for mobility. Sit's EOB with supervision with some minor LOB posteriorly while scooting to EOB.   Donned disposable scrub pants with minA for threading - pt able to pull pants over hips in standing with min/modA for standing balance. Stand<>pivot transfer with min/modA and no AD with face-to-face from EOB to w/c, towards his L side. Cues for visual scanning due to R gaze preference.  Transported in w/c to ortho rehab gym. Assisted to Nustep in similar manner. Completed for 9 minutes at L6, using BUE/BLE with verbal cueing needed to locate handle on L. Played Jazz Music (pt selected) to improve participation and affect.   Squat<>pivot with modA back to w/c and then returned to room where he remained seated in chair with safety belt alarm on, call bell in lap, all needs met.   Therapy Documentation Precautions:  Precautions Precautions: Fall Precaution Comments: R gaze preference, R lateral lean in sitting/standing Restrictions Weight Bearing Restrictions: No General:    Therapy/Group: Individual Therapy  Alger Simons 09/17/2021, 8:43 AM

## 2021-09-17 NOTE — Progress Notes (Addendum)
Speech Language Pathology Daily Session Note  Patient Details  Name: Shawn Austin MRN: [id number] Date of Birth: 1965/07/04  Today's Date: 09/17/2021 SLP Individual Time: 1100-1200 SLP Individual Time Calculation (min): 60 min  Short Term Goals: Week 1: SLP Short Term Goal 1 (Week 1): Patient will sustain attention to functional tasks for 5 minute intervals with mod A verbal redirection SLP Short Term Goal 2 (Week 1): Patient will complete basic problem solving tasks with max A verbal/visual cues to achieve 75% accuracy SLP Short Term Goal 3 (Week 1): Patient will utilize external memory aids to recall functional information with mod A verbal cues for effectiveness SLP Short Term Goal 4 (Week 1): Patient will demonstrate awareness to errors and repair with max A verbal cues SLP Short Term Goal 5 (Week 1): Pt will visually scan left during functional tasks with max A verbal cues during 75% of opportunities  Skilled Therapeutic Interventions: Skilled ST treatment focused on cognitive goals. Pt received upright in wheelchair and agreeable to completing session in speech therapy office. SLP facilitated recall of medications discussed during yesterday's ST session using medication chart. Pt with limited recall of medications with the exception of blood pressure meds, and stating he doesn't feel that currently prescribed medications are necessary. Pt was encouraged to discuss further medication questions with doctor and nurse during rounds/med passes.  SLP reviewed current medication list in detail, including medication name, purpose, and frequency. Pt exhibited difficulty reading large print label due to visual deficits; requiring max A verbal/visual cues to visually scan left. Continues to exhibit right gaze preference and limited-to-no eye contact with SLP sitting on left hand side during session. Pt required max A verbal cues for comprehension of medication labels, often mixing up once daily vs.  twice daily, as well as number of medications to be taken at a time. Pt exhibited decreased carry over of education and this was likely exacerbated by decreased recognition of errors requiring max A for awareness. When given simple hypothetical scenarios, pt responded appropriately with min-to-mod A verbal cues. Following discussion, pt was able to recall up to 3 of 8 medications by name, although none by name + purpose. SLP continues to reference written med list which was left on bedside table. Pt was taken back to room and left in wheelchair with alarm activated and immediate needs within reach at end of session. Continue per current plan of care.      Pain  None/denied  Therapy/Group: Individual Therapy  Tamala Ser 09/17/2021, 11:35 AM

## 2021-09-17 NOTE — Progress Notes (Signed)
Occupational Therapy Session Note  Patient Details  Name: Shawn Austin MRN: [id number] Date of Birth: February 10, 1966  Today's Date: 09/17/2021 OT Individual Time: 4008-6761 OT Individual Time Calculation (min): 28 min    Short Term Goals: Week 1:  OT Short Term Goal 1 (Week 1): Pt will be able to complete squat pivot transfers to toilet with min A. OT Short Term Goal 2 (Week 1): Pt will be able to stand from toilet and hold balance with min A while he pulls pants over hips. OT Short Term Goal 3 (Week 1): Pt will don a shirt with supervision demonstrating improved coordination. OT Short Term Goal 4 (Week 1): Pt will scan to L environment with min cues or less.  Skilled Therapeutic Interventions/Progress Updates:    Patient received seated in wheelchair with head resting on tray table.  Emesis bag lying next to him.  Patient denies nausea or dizziness.  Patient transported to gym.  Patient transferred toward right level surface via squat pivot with mod assist.  Patient with strong extensor patterning in hips mid transfer resulting in mass well behind base of support.  Worked on midline orientation - body, visually.  Patient able to sustain eyes midline for  Several seconds before gaze veers to back to right.  Patient can track horizontally to left - but cannot sustain gaze left.  Patient limited with left superior and inferior oculomotor control.   Worked to increase patient's comfort with being upright and in midline, then flexiong forward at hips, and using feet to lift seat for squat pivot transfer.  Facilitated forward weight shift with trunk extension, and slow controlled movement of transfer.  Worked on weight shifting to left/right with trunk extension.   Patient returned to room.  Seat pad alarm engaged.  Call bell on table in front of patient.    Therapy Documentation Precautions:  Precautions Precautions: Fall Precaution Comments: R gaze preference, R lateral lean in  sitting/standing Restrictions Weight Bearing Restrictions: No  Pain:  Denies pain    Therapy/Group: Individual Therapy  Collier Salina 09/17/2021, 3:21 PM

## 2021-09-18 DIAGNOSIS — E785 Hyperlipidemia, unspecified: Secondary | ICD-10-CM

## 2021-09-18 LAB — GLUCOSE, CAPILLARY
Glucose-Capillary: 90 mg/dL (ref 70–99)
Glucose-Capillary: 159 mg/dL — ABNORMAL HIGH (ref 70–99)
Glucose-Capillary: 178 mg/dL — ABNORMAL HIGH (ref 70–99)
Glucose-Capillary: 130 mg/dL — ABNORMAL HIGH (ref 70–99)

## 2021-09-18 NOTE — Progress Notes (Signed)
PROGRESS NOTE   Subjective/Complaints: In gym this AM. No new concerns.   Review of Systems  Constitutional:  Negative for chills and fever.  Eyes:  Positive for blurred vision. Negative for double vision.  Respiratory:  Negative for cough and shortness of breath.   Cardiovascular:  Negative for chest pain and palpitations.  Gastrointestinal:  Negative for abdominal pain, constipation, nausea and vomiting.  Genitourinary: Negative.   Musculoskeletal:  Positive for joint pain.  Neurological:  Positive for speech change and weakness.    Objective:   No results found. No results for input(s): "WBC", "HGB", "HCT", "PLT" in the last 72 hours.  Recent Labs    09/17/21 0758  NA 138  K 3.8  CL 106  CO2 25  GLUCOSE 136*  BUN 11  CREATININE 0.93  CALCIUM 8.8*     Intake/Output Summary (Last 24 hours) at 09/18/2021 0930 Last data filed at 09/17/2021 2145 Gross per 24 hour  Intake 540 ml  Output 350 ml  Net 190 ml         Physical Exam: Vital Signs Blood pressure (!) 151/86, pulse 83, temperature 98.7 F (37.1 C), resp. rate 14, height 5\' 9"  (1.753 m), weight 82.5 kg, SpO2 100 %.  Gen: no distress, normal appearing, working with PT  HEENT: oral mucosa pink and moist, NCAT Cardio: Reg rate Chest: CTAB, normal effort, normal rate of breathing Abd: soft, non-distended, + BS Ext: no edema Psych: Pt's affect is appropriate. Pt is cooperative Skin: warm and dry Neuro:Speech clear. Keeps eyes turned to the right and able to move to left with some effort. Left lateral rectus weakness. Left central 7. Demonstrates almost a left inattention-improved. Follows commands. Altered Left finger to nose , decreased fine motor movement LLE. Strength 5/5 RUE and RLE and grossly 4 to 4+/5 LUE and LLE. Senses pain in all 4'. No resting tone. Musculoskeletal: No joint swelling or tenderness noted, ROM normal  Assessment/Plan: 1.  Functional deficits which require 3+ hours per day of interdisciplinary therapy in a comprehensive inpatient rehab setting. Physiatrist is providing close team supervision and 24 hour management of active medical problems listed below. Physiatrist and rehab team continue to assess barriers to discharge/monitor patient progress toward functional and medical goals  Care Tool:  Bathing    Body parts bathed by patient: Right arm, Chest, Abdomen, Left arm, Front perineal area, Right upper leg, Left upper leg, Face   Body parts bathed by helper: Buttocks, Right lower leg, Left lower leg     Bathing assist Assist Level: Moderate Assistance - Patient 50 - 74%     Upper Body Dressing/Undressing Upper body dressing   What is the patient wearing?: Pull over shirt    Upper body assist Assist Level: Supervision/Verbal cueing    Lower Body Dressing/Undressing Lower body dressing      What is the patient wearing?: Pants, Incontinence brief     Lower body assist Assist for lower body dressing: Moderate Assistance - Patient 50 - 74%     Toileting Toileting    Toileting assist Assist for toileting: Maximal Assistance - Patient 25 - 49%     Transfers Chair/bed transfer  Transfers  assist     Chair/bed transfer assist level: Moderate Assistance - Patient 50 - 74%     Locomotion Ambulation   Ambulation assist      Assist level: 2 helpers Assistive device: Other (comment) (3 Musketeer) Max distance: 55   Walk 10 feet activity   Assist     Assist level: 2 helpers Assistive device: Hand held assist, Other (comment) (3 Musketeer)   Walk 50 feet activity   Assist    Assist level: 2 helpers Assistive device: Hand held assist    Walk 150 feet activity   Assist Walk 150 feet activity did not occur: Safety/medical concerns  Assist level:  (unable to perform due to strength, balance and coordiantion deficits)      Walk 10 feet on uneven surface   activity   Assist     Assist level: Maximal Assistance - Patient 25 - 49% Assistive device: Hand held assist, Other (comment) (handrail)   Wheelchair     Assist Is the patient using a wheelchair?: Yes Type of Wheelchair: Manual    Wheelchair assist level: Supervision/Verbal cueing Max wheelchair distance: 314 feet    Wheelchair 50 feet with 2 turns activity    Assist        Assist Level: Supervision/Verbal cueing   Wheelchair 150 feet activity     Assist      Assist Level: Supervision/Verbal cueing   Blood pressure (!) 151/86, pulse 83, temperature 98.7 F (37.1 C), resp. rate 14, height 5\' 9"  (1.753 m), weight 82.5 kg, SpO2 100 %.  Medical Problem List and Plan: 1. 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  Antithrombotics: -DVT/anticoagulation:  Pharmaceutical: Lovenox             -antiplatelet therapy: DAPT X 3 months followed by ASA 325 mg daily 3. Pain Management:  Tylenol prn.  4. Mood/Behavior/Sleep: LCSW to follow for evaluation and support.              --Trazodone prn for insomnia             -antipsychotic agents: N/A 5. Neuropsych/cognition: This patient is capable of making decisions on his own behalf. 6. Skin/Wound Care:  Routine pressure relief measures.  7. Fluids/Electrolytes/Nutrition: Monitor I/O. Check CMET in am. 8. HTN: Monitor BP TID--avoid hypotension with Right M2 occlusion.             --continue Cozaar, Coreg and HCTZ  --Will decrease HCTZ to 12.5mg  due to AKI  -7/31 will increase HCTZ back to 25mg  daily  8/1 Improved, monitor trend  -8/3 Consider medication adjustment tomorrow if BP remains high 9. T2DM: Hgb A1c- 8.1 (down from 12). Will continue to monitor BS ac/hs              continue Glipizide and metformin  -Well controlled, continue to monitor for now  -8/1 DC glipizide  -8/3 good control overall  10. AKI  -Will decrease HCTZ to 12.5mg   -NS 50 ml/hr startedd 7/28, Cr reviewed and has normalized  -Will DC fluids 7/31, recheck labs on Wednesday 8/2  -Scr 0.93 8/2, stable, monitor with weekly labs 11. HLD  -continue statin, counseling regarding healthy  diet  LOS: 7 days A FACE TO FACE EVALUATION WAS PERFORMED  Fanny Dance 09/18/2021, 9:30 AM

## 2021-09-18 NOTE — Progress Notes (Signed)
Occupational Therapy Session Note  Patient Details  Name: Shawn Austin MRN: [id number] Date of Birth: 1965/11/26  Today's Date: 09/18/2021 OT Individual Time: 9324-1991 OT Individual Time Calculation (min): 75 min    Short Term Goals: Week 1:  OT Short Term Goal 1 (Week 1): Pt will be able to complete squat pivot transfers to toilet with min A. OT Short Term Goal 2 (Week 1): Pt will be able to stand from toilet and hold balance with min A while he pulls pants over hips. OT Short Term Goal 3 (Week 1): Pt will don a shirt with supervision demonstrating improved coordination. OT Short Term Goal 4 (Week 1): Pt will scan to L environment with min cues or less.  Skilled Therapeutic Interventions/Progress Updates:    Pt received in w/c dressed and ready for therapy. Pt taken to gym and focus of session on transfers, sit and stand balance, postural control, eye tracking to L with work on sustained gaze.  Pt worked on squat pivot transfers w/c to Mat 8x for reinforcement of techniques with cues for foot placement, scanning, forward trunk lean.  Pt able to complete the last 4 transfers with min to even CGA.  In sitting, pt worked on trunk control with dynamic reach to left, trunk rotation, place wedge under R hip to help with pelvic alignment.  Visual scanning to L which pt can achieve but unable to hold gaze more than a second as eyes shift back to center.   Sit to stands from mat with pushing up from mat and then standing and placing hands on back of chair 8x.  Stood and worked on wt shifts.   Noted pt's brief was wet and leaked slightly on mat.   Pt returned to room and taken to bathroom to transfer to toilet with grab bar min A.  Doffed his brief which was soaked, pt totally unaware he was in wet brief. Notified his RN and NT.  Pt worked on standing and self cleansing his bottom and front area.  With the dynamic task pt had more difficulty and was leaning into his R leg pushing with L .  Tried  to use the wall as a reference cue.  Pt able to pull pants up with min A.  Transferred back to w/c.  Set up in wc with table to work on visual scanning.  With his reading glasses from home, pt can now read smaller print such as his menu.  He worked on a L to [employer] scanning task with paper placed in his L visual field, he did well at this task with no errors.  Pt resting in w/c with belt alarm on and all needs met.   Therapy Documentation Precautions:  Precautions Precautions: Fall Precaution Comments: R gaze preference, R lateral lean in sitting/standing Restrictions Weight Bearing Restrictions: No  Pain: Pain Assessment Pain Score: 0-No pain    Therapy/Group: Individual Therapy  Ramonia Mcclaran 09/18/2021, 12:37 PM

## 2021-09-18 NOTE — Plan of Care (Signed)
  Problem: Consults Goal: RH STROKE PATIENT EDUCATION Description: See Patient Education module for education specifics  Outcome: Progressing   Problem: RH BLADDER ELIMINATION Goal: RH STG MANAGE BLADDER WITH ASSISTANCE Description: STG Manage Bladder With mod I  and toileting Assistance Outcome: Progressing   Problem: RH SAFETY Goal: RH STG ADHERE TO SAFETY PRECAUTIONS W/ASSISTANCE/DEVICE Description: STG Adhere to Safety Precautions With cues Assistance/Device. Outcome: Progressing

## 2021-09-18 NOTE — Progress Notes (Signed)
Speech Language Pathology Daily Session Note  Patient Details  Name: Shawn Austin MRN: [id number] Date of Birth: [birth year]  Today's Date: 09/18/2021 SLP Individual Time: 1450-1532 SLP Individual Time Calculation (min): 42 min  Short Term Goals: Week 1: SLP Short Term Goal 1 (Week 1): Patient will sustain attention to functional tasks for 5 minute intervals with mod A verbal redirection SLP Short Term Goal 2 (Week 1): Patient will complete basic problem solving tasks with max A verbal/visual cues to achieve 75% accuracy SLP Short Term Goal 3 (Week 1): Patient will utilize external memory aids to recall functional information with mod A verbal cues for effectiveness SLP Short Term Goal 4 (Week 1): Patient will demonstrate awareness to errors and repair with max A verbal cues SLP Short Term Goal 5 (Week 1): Pt will visually scan left during functional tasks with max A verbal cues during 75% of opportunities  Skilled Therapeutic Interventions: Skilled ST treatment focused on cognitive goals. Pt received on commode with NT present. Pt unable to void bowel or bladder. Returned to wheelchair using [employer].   SLP facilitated session by reviewing current medication list as discussed thoroughly during yesterday's session. Pt was able to recall 2/6 medications independently, progressing to 4/6 with mod A for use of external aid via medication chart.   SLP facilitated novel card task "blink" for 12 minute duration with sup A verbal cues for sustained attention. Pt completed task with mod fading to sup A verbal cues during 2nd round for problem solving and reasoning; recall of task instructions with sup A verbal cues to refer to external aid. Pt initially required max A for awareness of errors during <25% of occasions, eventually fading to min A to achieve ~75% accuracy.   SLP facilitated functional memory task through education on internal memory strategies (repetition, chunking, association, picture it).  Pt then utilized strategies to recall grocery list of 9 items with 65% accuracy following 2 minutes given no cues, progressing to 80% accuracy with mod A semantic cues and field of choices. Pt read large print list at word level with 100% accuracy at sup A verbal cues to visually scan left of midline.  Patient was left in wheelchair with alarm activated and immediate needs within reach at end of session. Continue per current plan of care.      Pain Pain Assessment Pain Score: 0-No pain  Therapy/Group: Individual Therapy  Tamala Ser 09/18/2021, 3:02 PM

## 2021-09-18 NOTE — Progress Notes (Signed)
Physical Therapy Session Note  Patient Details  Name: Shawn Austin MRN: [id number] Date of Birth: 11-18-1965  Today's Date: 09/18/2021 PT Individual Time: 0815-0900 PT Individual Time Calculation (min): 45 min & 43 min  PT Missed Time: 15 Minutes Missed Time Reason: Patient unwilling to participate;Other (Comment) (eating breakfast)  Short Term Goals: Week 1:  PT Short Term Goal 1 (Week 1): Pt will perform sit to stand with LRAD and min A PT Short Term Goal 2 (Week 1): Pt will perform stand pivot transfer with LRAD and mod A PT Short Term Goal 3 (Week 1): Pt will ambulate 50 ft with LRAD and mod A   Skilled Therapeutic Interventions/Progress Updates:      Therapy Documentation Precautions:  Precautions Precautions: Fall Precaution Comments: R gaze preference, R lateral lean in sitting/standing Restrictions Weight Bearing Restrictions: No   Treatment Session 1:  Pt received semi-reclined in bed eating breakfast and deferred PT until he finished. Pt missed 15 minutes of skilled physical therapy during session. Pt declines pain at start of session. Pt requires supervision with rolling and sup to sit to the right with HOB elevated. Pt requires min-mod A with donning upper and lower body dressing. Pt propelled w/c with bilateral UE ~50 ft and transported to ortho gym by PT for time management and energy conservation. Pt requires min to mod A with sit to stand with RW. Pt participated in blocked practice of stand pivot transfers. Pt requires mod A without an AD and PT assist and min A with RW. Pt requires verbal and tactile cueing for transfer set up, sequencing and lateral weight shifting to the left. Pt performed ~12 transfers from w/c <>mat table. Pt required mod A with gait training of 5 feet with RW with cueing at trunk/hip for lateral left weight shift. Pt transported to hospital room and left seated in w/c at bedside with chair alarm on and all needs within reach.    Treatment  Session 2:   Pt received seated in w/c at bedside with head down on tray sleeping. Pt easily awoken and agreeable to PT session and reports he is "tired" and declines pain. Pt transported to dayroom via w/c for time management and energy conservation. Pt ambulated ~3 feet with RW and max A and presented with significant right lateral lean and difficulty with weight shifting. Pt transitioned to gait training in parallel bars and required mod A to ambulate ~10 ft with visual (mirror) and tactile cueing. Pt required mod A for dynamic standing balance in parallel bars. Pt practiced picking up horse shoe with R UE on right and reaching across midline either above shoulder level or at hip level for target on the left to emphasize lateral weight shifting. Pt propelled w/c from main gym to hospital room ~210 feet with bilateral UE. Pt left seated in w/c at bedside with chair alarm on and all needs in reach. NT present at end of session to measure vitals.   Therapy/Group: Individual Therapy  Truitt Leep Truitt Leep PT, DPT  09/18/2021, 7:27 AM

## 2021-09-19 LAB — GLUCOSE, CAPILLARY
Glucose-Capillary: 124 mg/dL — ABNORMAL HIGH (ref 70–99)
Glucose-Capillary: 157 mg/dL — ABNORMAL HIGH (ref 70–99)
Glucose-Capillary: 154 mg/dL — ABNORMAL HIGH (ref 70–99)
Glucose-Capillary: 152 mg/dL — ABNORMAL HIGH (ref 70–99)

## 2021-09-19 MED ORDER — SORBITOL 70 % SOLN
45.0000 mL | Freq: Once | Status: AC
  Administered 2021-09-20: 45 mL via ORAL
  Filled 2021-09-19: qty 60

## 2021-09-19 NOTE — Progress Notes (Signed)
Physical Therapy Weekly Progress Note  Patient Details  Name: Shawn Austin MRN: [id number] Date of Birth: 09/24/1965  Beginning of progress report period: September 12, 2021 End of progress report period: September 19, 2021    Patient has met 2 of 3 short term goals.  Patient is making steady progress to long terms goals due to improvements in strength, balance, coordination and activity tolerance. Pt continues to present with significant right lateral lean which limits dynamic standing and functional activities (transfers and gait), however pt is improving with static standing balance and postural control. Pt requires supervision with bed mobility, min A with sit to stand with RW, mod A with stand pivot transfers with RW, and max A x 2 with ambulation of 55 ft with 3 musketeer technique. Plan to continue to engage in motor control, postural control, transfers, gait, stair training and car transfers along with family education.   Patient continues to demonstrate the following deficits muscle weakness, decreased cardiorespiratoy endurance, impaired timing and sequencing, unbalanced muscle activation, ataxia, decreased coordination, and decreased motor planning, decreased visual acuity, decreased visual perceptual skills, and decreased visual motor skills, decreased midline orientation, decreased attention to left, and decreased motor planning, and decreased initiation, decreased attention, decreased awareness, decreased problem solving, decreased safety awareness, decreased memory, and delayed processing and therefore will continue to benefit from skilled PT intervention to increase functional independence with mobility.  Patient progressing toward long term goals..  Continue plan of care.  PT Short Term Goals Week 1:  PT Short Term Goal 1 (Week 1): Pt will perform sit to stand with LRAD and min A PT Short Term Goal 1 - Progress (Week 1): Met PT Short Term Goal 2 (Week 1): Pt will perform stand pivot  transfer with LRAD and mod A PT Short Term Goal 2 - Progress (Week 1): Met PT Short Term Goal 3 (Week 1): Pt will ambulate 50 ft with LRAD and mod A PT Short Term Goal 3 - Progress (Week 1): Progressing toward goal Week 2:  PT Short Term Goal 1 (Week 2): Pt will perform sit to stand with LRAD and CGA PT Short Term Goal 2 (Week 2): Pt will perform stand pivot transfer with LRAD and min A PT Short Term Goal 3 (Week 2): Pt will ambulate 25 feet with mod A and LRAD  Skilled Therapeutic Interventions/Progress Updates:  Ambulation/gait training;Discharge planning;DME/adaptive equipment instruction;Functional mobility training;Psychosocial support;Therapeutic Activities;UE/LE Strength taining/ROM;Visual/perceptual remediation/compensation;Balance/vestibular training;Community reintegration;Disease management/prevention;Neuromuscular re-education;Patient/family education;Stair training;Therapeutic Exercise;UE/LE Coordination activities;Wheelchair propulsion/positioning   Therapy Documentation Precautions:  Precautions Precautions: Fall Precaution Comments: R gaze preference, R lateral lean in sitting/standing Restrictions Weight Bearing Restrictions: No    Therapy/Group: Individual Therapy  Verl Dicker 09/19/2021, 7:49 AM

## 2021-09-19 NOTE — Progress Notes (Signed)
PROGRESS NOTE   Subjective/Complaints: In chair this AM. Reports he was a little tired yesterday but is more awake today.   Review of Systems  Constitutional:  Negative for chills and fever.  Eyes:  Positive for blurred vision. Negative for double vision.  Respiratory:  Negative for cough and shortness of breath.   Cardiovascular:  Negative for chest pain and palpitations.  Gastrointestinal:  Negative for abdominal pain, constipation, nausea and vomiting.  Genitourinary: Negative.   Musculoskeletal:  Positive for joint pain.  Neurological:  Positive for speech change and weakness. Negative for headaches.    Objective:   No results found. No results for input(s): "WBC", "HGB", "HCT", "PLT" in the last 72 hours.  Recent Labs    09/17/21 0758  NA 138  K 3.8  CL 106  CO2 25  GLUCOSE 136*  BUN 11  CREATININE 0.93  CALCIUM 8.8*     Intake/Output Summary (Last 24 hours) at 09/19/2021 1422 Last data filed at 09/19/2021 1411 Gross per 24 hour  Intake 578 ml  Output 100 ml  Net 478 ml         Physical Exam: Vital Signs Blood pressure 123/80, pulse 87, temperature (!) 97.4 F (36.3 C), resp. rate 17, height 5\' 9"  (1.753 m), weight 82.5 kg, SpO2 100 %.  Gen: no distress, normal appearing, working with PT  HEENT: oral mucosa pink and moist, NCAT Cardio: Reg rate Chest: CTAB, normal effort, normal rate of breathing Abd: soft, non-distended,non-tender + BS Ext: no edema Psych: Pt's affect is appropriate. Pt is cooperative Skin: warm and dry Neuro:Speech clear. Keeps eyes turned to the right and able to move to left with some effort. Left lateral rectus weakness. Left central 7. Demonstrates almost a left inattention-improved. Follows commands. Altered Left finger to nose , decreased fine motor movement LLE. Strength 5/5 RUE and RLE and grossly 4 to 4+/5 LUE and LLE. Senses pain in all 4'. No resting  tone. Musculoskeletal: No joint swelling or tenderness noted, ROM normal  Assessment/Plan: 1. Functional deficits which require 3+ hours per day of interdisciplinary therapy in a comprehensive inpatient rehab setting. Physiatrist is providing close team supervision and 24 hour management of active medical problems listed below. Physiatrist and rehab team continue to assess barriers to discharge/monitor patient progress toward functional and medical goals  Care Tool:  Bathing    Body parts bathed by patient: Right arm, Chest, Abdomen, Left arm, Front perineal area, Right upper leg, Left upper leg, Face, Right lower leg, Left lower leg   Body parts bathed by helper: Buttocks     Bathing assist Assist Level: Minimal Assistance - Patient > 75%     Upper Body Dressing/Undressing Upper body dressing   What is the patient wearing?: Pull over shirt    Upper body assist Assist Level: Supervision/Verbal cueing    Lower Body Dressing/Undressing Lower body dressing      What is the patient wearing?: Pants, Incontinence brief     Lower body assist Assist for lower body dressing: Minimal Assistance - Patient > 75%     Toileting Toileting    Toileting assist Assist for toileting: Maximal Assistance - Patient 25 -  49%     Transfers Chair/bed transfer  Transfers assist     Chair/bed transfer assist level: Moderate Assistance - Patient 50 - 74%     Locomotion Ambulation   Ambulation assist      Assist level: Moderate Assistance - Patient 50 - 74% Assistive device: Lite Gait Max distance: 169 feet   Walk 10 feet activity   Assist     Assist level: 2 helpers Assistive device: Hand held assist, Other (comment) (3 Musketeer)   Walk 50 feet activity   Assist    Assist level: 2 helpers Assistive device: Hand held assist    Walk 150 feet activity   Assist Walk 150 feet activity did not occur: Safety/medical concerns  Assist level:  (unable to perform due  to strength, balance and coordiantion deficits)      Walk 10 feet on uneven surface  activity   Assist     Assist level: Maximal Assistance - Patient 25 - 49% Assistive device: Hand held assist, Other (comment) (handrail)   Wheelchair     Assist Is the patient using a wheelchair?: Yes Type of Wheelchair: Manual    Wheelchair assist level: Supervision/Verbal cueing Max wheelchair distance: 314 feet    Wheelchair 50 feet with 2 turns activity    Assist        Assist Level: Supervision/Verbal cueing   Wheelchair 150 feet activity     Assist      Assist Level: Supervision/Verbal cueing   Blood pressure 123/80, pulse 87, temperature (!) 97.4 F (36.3 C), resp. rate 17, height 5\' 9"  (1.753 m), weight 82.5 kg, SpO2 100 %.  Medical Problem List and Plan: 1. Functional deficits secondary to right MCA distribution infarct, likely embolic             -patient may shower             -ELOS/Goals: 14 days, supervision goals  Continue CIR PT/OT/SLP  -Improved visual scanning to the left  -Ambulated 55 feet max A x2 2.  Antithrombotics: -DVT/anticoagulation:  Pharmaceutical: Lovenox             -antiplatelet therapy: DAPT X 3 months followed by ASA 325 mg daily 3. Pain Management:  Tylenol prn.  4. Mood/Behavior/Sleep: LCSW to follow for evaluation and support.              --Trazodone prn for insomnia             -antipsychotic agents: N/A 5. Neuropsych/cognition: This patient is capable of making decisions on his own behalf. 6. Skin/Wound Care:  Routine pressure relief measures.  7. Fluids/Electrolytes/Nutrition: Monitor I/O. Check CMET in am. 8. HTN: Monitor BP TID--avoid hypotension with Right M2 occlusion.             --continue Cozaar, Coreg and HCTZ  --Will decrease HCTZ to 12.5mg  due to AKI  -7/31 will increase HCTZ back to 25mg  daily  8/4 BP well controlled 9. T2DM: Hgb A1c- 8.1 (down from 12). Will continue to monitor BS ac/hs             continue  Glipizide and metformin  -Well controlled, continue to monitor for now  -8/1 DC glipizide  -8/4 fair control overall  10. AKI  -Will decrease HCTZ to 12.5mg   -NS 50 ml/hr startedd 7/28, Cr reviewed and has normalized  -Will DC fluids 7/31, recheck labs on Wednesday 8/2  -Scr 0.93 8/2, stable, monitor with weekly labs 11. HLD  -continue statin, heart healthy  diet  LOS: 8 days A FACE TO FACE EVALUATION WAS PERFORMED  Fanny Dance 09/19/2021, 2:22 PM

## 2021-09-19 NOTE — Progress Notes (Signed)
Patient ID: Shawn Austin, male   DOB: September 05, 1965, 56 y.o.   MRN: [id number]  Form completed by MD for [employer] disability faxed and original returned to katine-pt's girlfriend.

## 2021-09-19 NOTE — Progress Notes (Signed)
Occupational Therapy Weekly Progress Note  Patient Details  Name: Shawn Austin MRN: [id number] Date of Birth: May 19, 1965  Beginning of progress report period: September 12, 2021 End of progress report period: September 19, 2021  Today's Date: 09/19/2021 OT Individual Time: 1638-4665 OT Individual Time Calculation (min): 75 min    Patient has met 2 of 4 short term goals.  Pt is making good progress with his transfers and standing and has been able to do so occasionally with min A but it is not consistent and he usually needs mod A.  Patient continues to demonstrate the following deficits: muscle weakness, ataxia and decreased coordination, decreased visual acuity, decreased visual perceptual skills, and decreased visual motor skills, decreased attention to left, decreased attention, decreased awareness, decreased problem solving, decreased safety awareness, decreased memory, and delayed processing, and decreased sitting balance, decreased standing balance, decreased postural control, and decreased balance strategies and therefore will continue to benefit from skilled OT intervention to enhance overall performance with BADL.  Patient progressing toward long term goals..  Continue plan of care.  OT Short Term Goals Week 1:  OT Short Term Goal 1 (Week 1): Pt will be able to complete squat pivot transfers to toilet with min A. OT Short Term Goal 1 - Progress (Week 1): Progressing toward goal OT Short Term Goal 2 (Week 1): Pt will be able to stand from toilet and hold balance with min A while he pulls pants over hips. OT Short Term Goal 2 - Progress (Week 1): Progressing toward goal OT Short Term Goal 3 (Week 1): Pt will don a shirt with supervision demonstrating improved coordination. OT Short Term Goal 3 - Progress (Week 1): Met OT Short Term Goal 4 (Week 1): Pt will scan to L environment with min cues or less. OT Short Term Goal 4 - Progress (Week 1): Met Week 2:  OT Short Term Goal 1 (Week 2): Pt  will be able to perform toilet transfers consistently with min A or less. OT Short Term Goal 2 (Week 2): Pt will be able to hold standning balance with min A or less consistently with only min cues to shift weight to L when he leans to R. OT Short Term Goal 3 (Week 2): Pt will be able to hold a L eye gaze for at least 5 seconds demonstrating improved oculomotor control to scan his environment.  Skilled Therapeutic Interventions/Progress Updates:    Pt received in w/c sitting in gown looking sleepy. Pt initially declined a shower but recommended he do one today as he has had several urinary accidents.  Pt declined needing to toilet even though I suggested several times. For safety, opted to use stedy for the shower.  Before getting in to shower, had pt at sink so he could brush teeth while standing in stedy. Pt needed more cueing today as his processing was much slower and demonstrated limited problem solving with managing toothpaste, brush etc.  While standing he urinated on stedy.  Pt did not realize he was going. Transferred into shower.  Pt able to bathe self except for his bottom,  using long sponge for feet.  With stedy transferred back to w/c to dress. He needed more cues today to don shirt as he started to put in on backwards.  He got his pants over his feet and stood with mod to balance while pulling over hips. He was able to don socks and shoes and ties them!  While donning his R shoe with foot  crossed on L knee, his eyes maintained a strong R gaze even with cues to "look" at his shoes. Pt stated "I am trying to look at them". Even with his head turned fully to L, his eyes were still gazed to the R.   Pt taken to gym to transfer to mat with only min squat pivot to his R.  On mat pt layed in supine, so we could focus solely on visual scanning and oculomotor strength with tracking a ball, sustained eye hold of 3 sec each.  Pt completed 10 reps at a time for several sets of following ball with his  eyes. Pt was able to move eyes to L and hold briefly.  Also worked on UE and neck stretches and neck ROM to turn head more fluidly.  Hand off to PT for his next session.   Therapy Documentation Precautions:  Precautions Precautions: Fall Precaution Comments: R gaze preference, R lateral lean in sitting/standing Restrictions Weight Bearing Restrictions: No     Pain: Pain Assessment Pain Scale: 0-10 Pain Score: 0-No pain ADL: ADL Eating: Set up Grooming: Supervision/safety, Moderate cueing Where Assessed-Grooming: Other (comment) (standing in stedy) Upper Body Bathing: Supervision/safety, Minimal cueing Where Assessed-Upper Body Bathing: Shower Lower Body Bathing: Minimal cueing, Minimal assistance Where Assessed-Lower Body Bathing: Shower Upper Body Dressing: Supervision/safety, Minimal cueing Where Assessed-Upper Body Dressing: Chair Lower Body Dressing: Minimal cueing, Minimal assistance Where Assessed-Lower Body Dressing: Wheelchair Toileting: Maximal assistance (Pt reports need to void bladder. Pt transported back to room to utilize urinal. Pt requires maximal assistance to place urinal, however, pt able to manage clothing to void bladder.) Where Assessed-Toileting: Medical laboratory scientific officer: Moderate assistance Toilet Transfer Method: Squat pivot Toilet Transfer Equipment: Bedside commode (BSC over toilet) Social research officer, government Method: Other (comment) (stedy) Walk-In Shower Equipment: Radio broadcast assistant, Grab bars ADL Comments: pt had difficulty scanning to L due to decreased oculomotor strength   Therapy/Group: Individual Therapy  , 09/19/2021, 12:33 PM

## 2021-09-19 NOTE — Progress Notes (Signed)
Physical Therapy Session Note  Patient Details  Name: Shawn Austin MRN: [id number] Date of Birth: 09-05-1965  Today's Date: 09/19/2021 PT Individual Time: 8295-6213 PT Individual Time Calculation (min): 71 min   Short Term Goals: Week 1:  PT Short Term Goal 1 (Week 1): Pt will perform sit to stand with LRAD and min A PT Short Term Goal 2 (Week 1): Pt will perform stand pivot transfer with LRAD and mod A PT Short Term Goal 3 (Week 1): Pt will ambulate 50 ft with LRAD and mod A  Skilled Therapeutic Interventions/Progress Updates:      Therapy Documentation Precautions:  Precautions Precautions: Fall Precaution Comments: R gaze preference, R lateral lean in sitting/standing Restrictions Weight Bearing Restrictions: No  Pt received working with OT in ortho gym and pt declined pain. Pt transported to dayroom for gait training with lite gait for time management and energy conservation. Pt ambulated 169 feet with mod A and additional assistance for steering of equipment. Pt required tactile cueing at trunk and pelvis to facilitate weight shift to left with gait training. Pt performed stand pivot transfer to mat with min A and no AD. Pt participated in postural control, balance, and strengthening activities. In seated position pt practiced weightbearing through forearm and utilized triceps to extend and find midline alignment of head and trunk in seated upright position. Pt initially performed 1 x 15 to the left on stable surface and progressed to performing activity to bilateral sides ( 2 x 15) on Airex foam pad. Pt able to attain midline postural alignment with verbal and visual cues (mirror). Pt then practiced sit to stand transfers and requires min A and pt is able to self correct with visual and verbal cueing to attain midline. Pt transported to room and left seated in w/c at bedside with chair alarm on and all needs within reach.     Therapy/Group: Individual Therapy  Truitt Leep Truitt Leep PT, DPT  09/19/2021, 7:36 AM

## 2021-09-19 NOTE — Progress Notes (Signed)
Speech Language Pathology Weekly Progress and Session Note  Patient Details  Name: Shawn Austin MRN: [id number] Date of Birth: 1965/04/12  Beginning of progress report period: September 12, 2021 End of progress report period: September 19, 2021  Today's Date: 09/19/2021 SLP Individual Time: 2992-4268 SLP Individual Time Calculation (min): 63 min  Short Term Goals: Week 1: SLP Short Term Goal 1 (Week 1): Patient will sustain attention to functional tasks for 5 minute intervals with mod A verbal redirection SLP Short Term Goal 1 - Progress (Week 1): Updated due to goal met SLP Short Term Goal 2 (Week 1): Patient will complete basic problem solving tasks with max A verbal/visual cues to achieve 75% accuracy SLP Short Term Goal 2 - Progress (Week 1): Met SLP Short Term Goal 3 (Week 1): Patient will utilize external memory aids to recall functional information with mod A verbal cues for effectiveness SLP Short Term Goal 3 - Progress (Week 1): Met SLP Short Term Goal 4 (Week 1): Patient will demonstrate awareness to errors and repair with max A verbal cues SLP Short Term Goal 4 - Progress (Week 1): Met SLP Short Term Goal 5 (Week 1): Pt will visually scan left during functional tasks with max A verbal cues during 75% of opportunities SLP Short Term Goal 5 - Progress (Week 1): Met  New Short Term Goals: Week 2: SLP Short Term Goal 1 (Week 2): Patient will demonstrate selective attention to functional tasks in mildly distracting environments with min A verbal redirection cues SLP Short Term Goal 2 (Week 2): Patient will complete mildly complex problem solving tasks with mod A verbal/visual cues to achieve 75% accuracy SLP Short Term Goal 3 (Week 2): Patient will utilize external memory aids to recall functional information with min A verbal cues for effectiveness SLP Short Term Goal 4 (Week 2): Patient will demonstrate awareness to errors and repair with mod A verbal cues SLP Short Term Goal 5 (Week  2): Pt will visually scan left during functional tasks with min A verbal cues during 75% of opportunities  Weekly Progress Updates: Patient has made steady gains and has met 5 of 5 STGs this reporting period. Patient is currently completing cognitive tasks with overall mod-to-max A verbal + visual cues in regards to problem solving, error awareness, and recall; min-to-mod A for visual scanning; min A for sustained attention. Patient and family education is ongoing. Patient would benefit from continued skilled SLP intervention to maximize cognitive functioning and overall functional independence prior to discharge.     Intensity: Minumum of 1-2 x/day, 30 to 90 minutes Frequency: 3 to 5 out of 7 days Duration/Length of Stay: 8/22 Treatment/Interventions: Cognitive remediation/compensation;Internal/external aids;Functional tasks;Patient/family education;Therapeutic Activities  Daily Session Skilled Therapeutic Interventions: Skilled ST treatment focused on cognitive goals. Pt received in wheelchair on arrival, leaning and sleeping on bedside table; roused to min verbal stimuli.   SLP facilitated session by reviewing internal memory strategies including repetition and association strategies. Pt completed sequential memory task on BITS for 5 minute intervals to achieve 66% accuracy given sup A cues, progressing to ~75% with min A verbal reinforcement. Pt benefited from min A verbal cues and additional time to scan left of midline. Pt completed visual scanning tasks: connecting sequential numbers and maze completion with sup A verbal cues to achieve 100% accuracy; alternating attention with trail making with 10 errors (50% accuracy) and required max A verbal + visual for awareness of errors.   Pt requested to use bathroom at end of  session. Facilitated transfer from w/c to commode using Stedy and sup A verbal cues to maintain upright posture due to tendency to learn forward/rest head on handle. Pt voided  bladder and was returned to bed at end of session with alarm activated and immediate needs within reach at end of session. Continue per current plan of care.       General    Pain  None/denied  Therapy/Group: Individual Therapy  Shawn Austin T [name] 09/19/2021, 4:30 PM

## 2021-09-19 NOTE — Progress Notes (Signed)
Occupational Therapy Session Note  Patient Details  Name: Shawn Austin MRN: [id number] Date of Birth: 03/31/1965  Today's Date: 09/19/2021 OT Individual Time: 1332-1400 OT Individual Time Calculation (min): 28 min    Short Term Goals: Week 2:  OT Short Term Goal 1 (Week 2): Pt will be able to perform toilet transfers consistently with min A or less. OT Short Term Goal 2 (Week 2): Pt will be able to hold standning balance with min A or less consistently with only min cues to shift weight to L when he leans to R. OT Short Term Goal 3 (Week 2): Pt will be able to hold a L eye gaze for at least 5 seconds demonstrating improved oculomotor control to scan his environment.  Skilled Therapeutic Interventions/Progress Updates:    Pt received sitting in w/c and agreeable to OT session. Pt finishing up lunch. Pt with heavy inattention to L side upon OT entry. Unable to visually look to L side to greet OT. While eating, pt engaged in visual scanning activities and dynamic reach. After using each dressing, pt verbally cued to look L and place packet in OTs hand while following with his eyes. Pt unable to follow with eyes to L side without moving his head completely over. Moved plate to L side to encourage visual scanning to the left. Pt questioning why I was moving the plate, only using fork on R side of the plate. Left in w/c with alarm on, finishing lunch, call bell in reach, and all needs met.  Therapy Documentation Precautions:  Precautions Precautions: Fall Precaution Comments: R gaze preference, R lateral lean in sitting/standing Restrictions Weight Bearing Restrictions: No  Therapy/Group: Individual Therapy  Kynzley Dowson 09/19/2021, 1:45 PM

## 2021-09-20 LAB — GLUCOSE, CAPILLARY
Glucose-Capillary: 143 mg/dL — ABNORMAL HIGH (ref 70–99)
Glucose-Capillary: 149 mg/dL — ABNORMAL HIGH (ref 70–99)
Glucose-Capillary: 189 mg/dL — ABNORMAL HIGH (ref 70–99)
Glucose-Capillary: 164 mg/dL — ABNORMAL HIGH (ref 70–99)

## 2021-09-20 MED ORDER — SORBITOL 70 % SOLN: 30.0000 mL | Freq: Once | Status: DC

## 2021-09-20 NOTE — Progress Notes (Signed)
Patient had 3 XL BM this AM. Patient declined soaps suds enema after therapy. Bowel sounds hyperactive x 4. Encouraged fluids.

## 2021-09-20 NOTE — Progress Notes (Signed)
Pt refused scheduled sorbitol at hs with no bowel movement-72 H. Educated pt about the need to have a BM. Pt bowel sounds present and active in all 4 quad. Pt opted to take sorbitol at am. Rescheduled medication at 0600. Will reinforce education.

## 2021-09-20 NOTE — Progress Notes (Signed)
Occupational Therapy Session Note  Patient Details  Name: Shawn Austin MRN: [id number] Date of Birth: 1965-10-02  Today's Date: 09/20/2021 OT Individual Time: 0900-1010 OT Individual Time Calculation (min): 70 min    Short Term Goals: Week 2:  OT Short Term Goal 1 (Week 2): Pt will be able to perform toilet transfers consistently with min A or less. OT Short Term Goal 2 (Week 2): Pt will be able to hold standning balance with min A or less consistently with only min cues to shift weight to L when he leans to R. OT Short Term Goal 3 (Week 2): Pt will be able to hold a L eye gaze for at least 5 seconds demonstrating improved oculomotor control to scan his environment.  Skilled Therapeutic Interventions/Progress Updates:    Pt received in bed upset that "people come in every 2 hours to ask me questions". Explained that is for his safety and to ensure that he is not needing to toilet. Asked pt to sit up to the EOB and realized he was laying in a pool of urine. The NT had just tried 30 min earlier to get him to toilet and he stated he did not need to go. Pt  totally unaware bed was soaking wet.  Pt transferred to wc sq pivot min then to shower bench min A using grab bar. Pt bathed thoroughly with cues, he was even able to stand using B hands on bar so I could assist washing bottom.  Needed cues to lean L towards wall as he continues to have a R lean.  After sitting back onto the open seat tub bench, pt had incontinent liquid stool. At first he was completely unaware, pt stood again for me to cleanse his bottom and said " I gotta go more" sat back down and more stool came out.  Stood again to be cleansed and placed brief under pt. He transferred back to wc min and then w/c to toilet with grab bar sq pivot min.  Before brief could be removed, he went a great deal more bowel. Brief removed, pt went more in toilet, then stood with B hands on bar for therapist to cleanse him.  New brief donned.  Pt sat in  wc at sink to complete oral care with no cues or set up. Donned deoderant and shirt with extra time but no cues.  Mod A sq pivot to R back to bed. Left pants off due to loose stools and no other therapy today.   In bed, pt layed in supine and was able to use legs to scoot up in bed.  L visual tracking and visual gaze holds tracking my finger. Pt had improved motor control of eyes today and was able to achieve full eye ROM.   Pt resting in bed with alarm on and all other needs met.   Therapy Documentation Precautions:  Precautions Precautions: Fall Precaution Comments: R gaze preference, R lateral lean in sitting/standing Restrictions Weight Bearing Restrictions: No   Pain: Pain Assessment Pain Score: 0-No pain ADL: ADL Eating: Set up Grooming: Supervision/safety, Moderate cueing Where Assessed-Grooming: Other (comment) (standing in stedy) Upper Body Bathing: Supervision/safety, Minimal cueing Where Assessed-Upper Body Bathing: Shower Lower Body Bathing: Minimal cueing, Minimal assistance Where Assessed-Lower Body Bathing: Shower Upper Body Dressing: Supervision/safety, Minimal cueing Where Assessed-Upper Body Dressing: Chair Lower Body Dressing: Minimal cueing, Minimal assistance Where Assessed-Lower Body Dressing: Wheelchair Toileting: Maximal assistance (Pt reports need to void bladder. Pt transported back to room to  utilize urinal. Pt requires maximal assistance to place urinal, however, pt able to manage clothing to void bladder.) Where Assessed-Toileting: Medical laboratory scientific officer: Minimal assistance Armed forces technical officer Method: Engineer, water: Bedside commode (BSC over toilet) Social research officer, government: Environmental education officer Method: Education officer, environmental: Radio broadcast assistant, Grab bars ADL Comments: pt had difficulty scanning to L due to decreased oculomotor strength   Therapy/Group: Individual  Therapy  Neibert 09/20/2021, 10:42 AM

## 2021-09-20 NOTE — Progress Notes (Signed)
PROGRESS NOTE   Subjective/Complaints:  Pt in shower- per OT, doesn't recognize when needs to void; or when wet- was wet when she got him this AM.   Pt has been refusing bowel meds/sorbitol even though no BM in 4-5+ days.  In shower currently with OT.  ROS: Admits to constipation- been >4-5 days-  Pt denies SOB, abd pain, CP, N/V/(+) C/D, and vision changes  Objective:   No results found. No results for input(s): "WBC", "HGB", "HCT", "PLT" in the last 72 hours.  No results for input(s): "NA", "K", "CL", "CO2", "GLUCOSE", "BUN", "CREATININE", "CALCIUM" in the last 72 hours.   Intake/Output Summary (Last 24 hours) at 09/20/2021 1044 Last data filed at 09/20/2021 0827 Gross per 24 hour  Intake 590 ml  Output 725 ml  Net -135 ml         Physical Exam: Vital Signs Blood pressure 134/89, pulse 98, temperature 98.1 F (36.7 C), temperature source Oral, resp. rate 18, height 5\' 9"  (1.753 m), weight 82.5 kg, SpO2 100 %.    General: awake, alert, appropriate,  speaking some; but decreased spontaneous speech; with OT,- in shower; NAD HENT:keeps head tilted; oropharynx moist CV: regular rate; no JVD Pulmonary: CTA B/L; no W/R/R- good air movement GI: soft, NT, ND, (+)BS Psychiatric: very flat Neurological: alert- but impaired /delayed repsonses Psych: Pt's affect is appropriate. Pt is cooperative Skin: warm and dry Neuro:Speech clear. Keeps eyes turned to the right and able to move to left with some effort. Left lateral rectus weakness. Left central 7. Demonstrates almost a left inattention-improved. Follows commands. Altered Left finger to nose , decreased fine motor movement LLE. Strength 5/5 RUE and RLE and grossly 4 to 4+/5 LUE and LLE. Senses pain in all 4'. No resting tone. Musculoskeletal: No joint swelling or tenderness noted, ROM normal  Assessment/Plan: 1. Functional deficits which require 3+ hours per day of  interdisciplinary therapy in a comprehensive inpatient rehab setting. Physiatrist is providing close team supervision and 24 hour management of active medical problems listed below. Physiatrist and rehab team continue to assess barriers to discharge/monitor patient progress toward functional and medical goals  Care Tool:  Bathing    Body parts bathed by patient: Right arm, Chest, Abdomen, Left arm, Front perineal area, Right upper leg, Left upper leg, Face, Right lower leg, Left lower leg   Body parts bathed by helper: Buttocks     Bathing assist Assist Level: Minimal Assistance - Patient > 75%     Upper Body Dressing/Undressing Upper body dressing   What is the patient wearing?: Pull over shirt    Upper body assist Assist Level: Supervision/Verbal cueing    Lower Body Dressing/Undressing Lower body dressing      What is the patient wearing?: Pants, Incontinence brief     Lower body assist Assist for lower body dressing: Minimal Assistance - Patient > 75%     Toileting Toileting    Toileting assist Assist for toileting: Maximal Assistance - Patient 25 - 49%     Transfers Chair/bed transfer  Transfers assist     Chair/bed transfer assist level: Moderate Assistance - Patient 50 - 74%     Locomotion  Ambulation   Ambulation assist      Assist level: Moderate Assistance - Patient 50 - 74% Assistive device: Lite Gait Max distance: 169 feet   Walk 10 feet activity   Assist     Assist level: 2 helpers Assistive device: Hand held assist, Other (comment) (3 Musketeer)   Walk 50 feet activity   Assist    Assist level: 2 helpers Assistive device: Hand held assist    Walk 150 feet activity   Assist Walk 150 feet activity did not occur: Safety/medical concerns  Assist level:  (unable to perform due to strength, balance and coordiantion deficits)      Walk 10 feet on uneven surface  activity   Assist     Assist level: Maximal Assistance -  Patient 25 - 49% Assistive device: Hand held assist, Other (comment) (handrail)   Wheelchair     Assist Is the patient using a wheelchair?: Yes Type of Wheelchair: Manual    Wheelchair assist level: Supervision/Verbal cueing Max wheelchair distance: 314 feet    Wheelchair 50 feet with 2 turns activity    Assist        Assist Level: Supervision/Verbal cueing   Wheelchair 150 feet activity     Assist      Assist Level: Supervision/Verbal cueing   Blood pressure 134/89, pulse 98, temperature 98.1 F (36.7 C), temperature source Oral, resp. rate 18, height 5\' 9"  (1.753 m), weight 82.5 kg, SpO2 100 %.  Medical Problem List and Plan: 1. Functional deficits secondary to right MCA distribution infarct, likely embolic             -patient may shower             -ELOS/Goals: 14 days, supervision goals  -Improved visual scanning to the left  -Ambulated 55 feet max A x2  Con't CIR- PT, OT and SLP 2.  Antithrombotics: -DVT/anticoagulation:  Pharmaceutical: Lovenox             -antiplatelet therapy: DAPT X 3 months followed by ASA 325 mg daily 3. Pain Management:  Tylenol prn.  4. Mood/Behavior/Sleep: LCSW to follow for evaluation and support.              --Trazodone prn for insomnia             -antipsychotic agents: N/A 5. Neuropsych/cognition: This patient is capable of making decisions on his own behalf. 6. Skin/Wound Care:  Routine pressure relief measures.  7. Fluids/Electrolytes/Nutrition: Monitor I/O. Check CMET in am. 8. HTN: Monitor BP TID--avoid hypotension with Right M2 occlusion.             --continue Cozaar, Coreg and HCTZ  --Will decrease HCTZ to 12.5mg  due to AKI  -7/31 will increase HCTZ back to 25mg  daily  8/4 BP well controlled 9. T2DM: Hgb A1c- 8.1 (down from 12). Will continue to monitor BS ac/hs             continue Glipizide and metformin  -Well controlled, continue to monitor for now  -8/1 DC glipizide  -8/4 fair control overall  8/5-  140s-150s- con't to monitor trend  10. AKI  -Will decrease HCTZ to 12.5mg   -NS 50 ml/hr startedd 7/28, Cr reviewed and has normalized  -Will DC fluids 7/31, recheck labs on Wednesday 8/2  -Scr 0.93 8/2, stable, monitor with weekly labs 11. HLD  -continue statin, heart healthy diet 12. Urinary incontinence  8/5- will order Timed voiding- q2 hours since cannot tell when going/needs to go  or has any control- and cannot feel when wet 13. Constipation  8/5- LBM 4-5+ days ago- d/w pt- insisted needs to take bowel meds or could require disimpaction- pt agreed- will lgive Sorbitol and soap suds enema if no BM   I spent a total of 37   minutes on total care today- >50% coordination of care- due to prolonged time with pt and OT as well as nursing about plans.     LOS: 9 days A FACE TO FACE EVALUATION WAS PERFORMED  Janele Lague 09/20/2021, 10:44 AM

## 2021-09-21 LAB — GLUCOSE, CAPILLARY
Glucose-Capillary: 137 mg/dL — ABNORMAL HIGH (ref 70–99)
Glucose-Capillary: 148 mg/dL — ABNORMAL HIGH (ref 70–99)
Glucose-Capillary: 97 mg/dL (ref 70–99)
Glucose-Capillary: 113 mg/dL — ABNORMAL HIGH (ref 70–99)

## 2021-09-21 NOTE — Progress Notes (Signed)
Occupational Therapy Session Note  Patient Details  Name: Shawn Austin MRN: [id number] Date of Birth: July 28, 1965  Today's Date: 09/21/2021 OT Individual Time: 2025-4270 OT Individual Time Calculation (min): 60 min    Short Term Goals: Week 2:  OT Short Term Goal 1 (Week 2): Pt will be able to perform toilet transfers consistently with min A or less. OT Short Term Goal 2 (Week 2): Pt will be able to hold standning balance with min A or less consistently with only min cues to shift weight to L when he leans to R. OT Short Term Goal 3 (Week 2): Pt will be able to hold a L eye gaze for at least 5 seconds demonstrating improved oculomotor control to scan his environment.  Skilled Therapeutic Interventions/Progress Updates:    Upon OT arrival, pt semi recumbent in bed wondering "when he can be done with this and go". Pt was educated on deficits and areas to improve on and pt is agreeable to OT treatment reporting no pain. Pt completes supine to sit transfer with CGA and sits EOB to retrieve clothes from bag located to L of pt requiring mod verbal cues to scan for clothing and identify clothing items once retrieved. Pt donns shirt with Supervision for correct orientation and donns pants with Mod A and verbal cues for orientation. Pt attempts sit to stand transfer with RW and Mod A but was demonstrates decreased dynamic standing balance. Pt requests to use the bathroom and was put on toilet using sara stedy with Min A. Pt requires Min A for posterior peri care and while attempting to manage pants pt incontinent of urine and bowel and returns to toilet. Pt requires Min A for posterior pericare a second time and requires assist to pull up brief to prevent another mess. Pt was returned to his bed with Clarise Cruz stedy and Total A and completes sit to supine transfer with Supervision. Pt declines to complete oral care. Pt was left in bed at end of session with all needs met and safety measures in place.   Therapy  Documentation Precautions:  Precautions Precautions: Fall Precaution Comments: R gaze preference, R lateral lean in sitting/standing Restrictions Weight Bearing Restrictions: No    Therapy/Group: Individual Therapy  Marvetta Gibbons 09/21/2021, 11:52 AM

## 2021-09-21 NOTE — Progress Notes (Signed)
PROGRESS NOTE   Subjective/Complaints:  Pt reports he's "good" and tired- wants me to leave him alone so can go back to sleep- doesn't have therapy today.   ROS: Limited by sleepiness  Objective:   No results found. No results for input(s): "WBC", "HGB", "HCT", "PLT" in the last 72 hours.  No results for input(s): "NA", "K", "CL", "CO2", "GLUCOSE", "BUN", "CREATININE", "CALCIUM" in the last 72 hours.   Intake/Output Summary (Last 24 hours) at 09/21/2021 1250 Last data filed at 09/21/2021 0926 Gross per 24 hour  Intake 474 ml  Output --  Net 474 ml        Physical Exam: Vital Signs Blood pressure 135/84, pulse 94, temperature 98.9 F (37.2 C), temperature source Oral, resp. rate 18, height 5\' 9"  (1.753 m), weight 82.5 kg, SpO2 100 %.     General: initially sleeping- woke to stimuli briefly. NAD HENT: gaze preference oropharynx moist CV: regular rate; no JVD Pulmonary: CTA B/L; no W/R/R- good air movement GI: soft, NT, ND, (+)BS Psychiatric: appropriate Neurological: slowed/sleepy Psych: Pt's affect is appropriate. Pt is cooperative Skin: warm and dry Neuro:Speech clear. Keeps eyes turned to the right and able to move to left with some effort. Left lateral rectus weakness. Left central 7. Demonstrates almost a left inattention-improved. Follows commands. Altered Left finger to nose , decreased fine motor movement LLE. Strength 5/5 RUE and RLE and grossly 4 to 4+/5 LUE and LLE. Senses pain in all 4'. No resting tone. Musculoskeletal: No joint swelling or tenderness noted, ROM normal  Assessment/Plan: 1. Functional deficits which require 3+ hours per day of interdisciplinary therapy in a comprehensive inpatient rehab setting. Physiatrist is providing close team supervision and 24 hour management of active medical problems listed below. Physiatrist and rehab team continue to assess barriers to discharge/monitor patient  progress toward functional and medical goals  Care Tool:  Bathing    Body parts bathed by patient: Right arm, Chest, Abdomen, Left arm, Front perineal area, Right upper leg, Left upper leg, Face, Right lower leg, Left lower leg   Body parts bathed by helper: Buttocks     Bathing assist Assist Level: Minimal Assistance - Patient > 75%     Upper Body Dressing/Undressing Upper body dressing   What is the patient wearing?: Pull over shirt    Upper body assist Assist Level: Supervision/Verbal cueing    Lower Body Dressing/Undressing Lower body dressing      What is the patient wearing?: Incontinence brief, Pants     Lower body assist Assist for lower body dressing: Moderate Assistance - Patient 50 - 74%     Toileting Toileting    Toileting assist Assist for toileting: Maximal Assistance - Patient 25 - 49%     Transfers Chair/bed transfer  Transfers assist     Chair/bed transfer assist level: Moderate Assistance - Patient 50 - 74%     Locomotion Ambulation   Ambulation assist      Assist level: Moderate Assistance - Patient 50 - 74% Assistive device: Lite Gait Max distance: 169 feet   Walk 10 feet activity   Assist     Assist level: 2 helpers Assistive device: Hand held  assist, Other (comment) (3 Musketeer)   Walk 50 feet activity   Assist    Assist level: 2 helpers Assistive device: Hand held assist    Walk 150 feet activity   Assist Walk 150 feet activity did not occur: Safety/medical concerns  Assist level:  (unable to perform due to strength, balance and coordiantion deficits)      Walk 10 feet on uneven surface  activity   Assist     Assist level: Maximal Assistance - Patient 25 - 49% Assistive device: Hand held assist, Other (comment) (handrail)   Wheelchair     Assist Is the patient using a wheelchair?: Yes Type of Wheelchair: Manual    Wheelchair assist level: Supervision/Verbal cueing Max wheelchair distance:  314 feet    Wheelchair 50 feet with 2 turns activity    Assist        Assist Level: Supervision/Verbal cueing   Wheelchair 150 feet activity     Assist      Assist Level: Supervision/Verbal cueing   Blood pressure 135/84, pulse 94, temperature 98.9 F (37.2 C), temperature source Oral, resp. rate 18, height 5\' 9"  (1.753 m), weight 82.5 kg, SpO2 100 %.  Medical Problem List and Plan: 1. Functional deficits secondary to right MCA distribution infarct, likely embolic             -patient may shower             -ELOS/Goals: 14 days, supervision goals  -Improved visual scanning to the left  -Ambulated 55 feet max A x2  Continue CIR- PT, OT and SLP  2.  Antithrombotics: -DVT/anticoagulation:  Pharmaceutical: Lovenox             -antiplatelet therapy: DAPT X 3 months followed by ASA 325 mg daily 3. Pain Management:  Tylenol prn.  4. Mood/Behavior/Sleep: LCSW to follow for evaluation and support.              --Trazodone prn for insomnia             -antipsychotic agents: N/A 5. Neuropsych/cognition: This patient is ?capable of making decisions on his own behalf. 6. Skin/Wound Care:  Routine pressure relief measures.  7. Fluids/Electrolytes/Nutrition: Monitor I/O. Check CMET in am. 8. HTN: Monitor BP TID--avoid hypotension with Right M2 occlusion.             --continue Cozaar, Coreg and HCTZ  --Will decrease HCTZ to 12.5mg  due to AKI  -7/31 will increase HCTZ back to 25mg  daily  8/5- BP well controlled- con't regimen 9. T2DM: Hgb A1c- 8.1 (down from 12). Will continue to monitor BS ac/hs             continue Glipizide and metformin  -Well controlled, continue to monitor for now  -8/1 DC glipizide  -8/4 fair control overall  8/5-8/6-  140s-150s- con't to monitor trend  10. AKI  -Will decrease HCTZ to 12.5mg   -NS 50 ml/hr startedd 7/28, Cr reviewed and has normalized  -Will DC fluids 7/31, recheck labs on Wednesday 8/2  -Scr 0.93 8/2, stable, monitor with weekly  labs 11. HLD  -continue statin, heart healthy diet 12. Urinary incontinence  8/5- will order Timed voiding- q2 hours since cannot tell when going/needs to go or has any control- and cannot feel when wet 13. Constipation  8/5- LBM 4-5+ days ago- d/w pt- insisted needs to take bowel meds or could require disimpaction- pt agreed- will lgive Sorbitol and soap suds enema if no BM  8/6- Had  at least 4 BM's- got cleaned out- didn't need enema.     LOS: 10 days A FACE TO FACE EVALUATION WAS PERFORMED  Shawn Austin 09/21/2021, 12:50 PM

## 2021-09-22 DIAGNOSIS — D649 Anemia, unspecified: Secondary | ICD-10-CM

## 2021-09-22 LAB — BASIC METABOLIC PANEL
Anion gap: 8 (ref 5–15)
BUN: 9 mg/dL (ref 6–20)
CO2: 25 mmol/L (ref 22–32)
Calcium: 8.4 mg/dL — ABNORMAL LOW (ref 8.9–10.3)
Chloride: 103 mmol/L (ref 98–111)
Creatinine, Ser: 0.99 mg/dL (ref 0.61–1.24)
GFR, Estimated: >60 mL/min (ref >60)
Glucose, Bld: 93 mg/dL (ref 70–99)
Potassium: 3.7 mmol/L (ref 3.5–5.1)
Sodium: 136 mmol/L (ref 135–145)

## 2021-09-22 LAB — CBC
HCT: 33.1 % — ABNORMAL LOW (ref 39.0–52.0)
Hemoglobin: 11.4 g/dL — ABNORMAL LOW (ref 13.0–17.0)
MCH: 28.4 pg (ref 26.0–34.0)
MCHC: 34.4 g/dL (ref 30.0–36.0)
MCV: 82.5 fL (ref 80.0–100.0)
Platelets: 442 10*3/uL — ABNORMAL HIGH (ref 150–400)
RBC: 4.01 MIL/uL — ABNORMAL LOW (ref 4.22–5.81)
RDW: 12.9 % (ref 11.5–15.5)
WBC: 10 10*3/uL (ref 4.0–10.5)
nRBC: 0 % (ref 0.0–0.2)

## 2021-09-22 LAB — GLUCOSE, CAPILLARY
Glucose-Capillary: 93 mg/dL (ref 70–99)
Glucose-Capillary: 80 mg/dL (ref 70–99)
Glucose-Capillary: 126 mg/dL — ABNORMAL HIGH (ref 70–99)
Glucose-Capillary: 168 mg/dL — ABNORMAL HIGH (ref 70–99)

## 2021-09-22 NOTE — Consult Note (Signed)
Neuropsychological Consultation   Patient:   Shawn Austin   DOB:   05-31-1965  MR Number:  [id number]  Location:  [facility] [address] [facility] [address] [place] [id number] [place] [address] Dept: [place]: [phone]           Date of Service:   09/22/2021  Start Time:   8 AM End Time:   9 AM  Provider/Observer:  Ilean Skill, Psy.D.       Clinical Neuropsychologist       Billing Code/Service: [phone]  Chief Complaint:    Shawn Austin is a 56 year old male with a past history of hypertension, type 2 diabetes, degenerative disc disease.  Patient was admitted on 09/01/2021 with reports of leaning to the left with falls, slurred speech as well as eye deviation to the right for the prior few days.  CT a head/neck was concerning for concerns of occlusion/moderate stenosis of distal left M1 and proximal right M2 segments.  MRI showed small acute ischemia in dorsolateral right medulla oblongata with chronic ischemic microangiopathic.  Stroke was felt to be due to small vessel disease.  Prior to admission to the inpatient rehab unit patient developed worsening balance as well as visual deficits on 7/25.  MRI brain showed a few new interval/acute infarcts in right frontoparietal region with mild increase in size of right medulla very infarct with no change in edema.  New stroke felt to be due to cardioembolic pattern.  Reason for Service:  Patient was referred for neuropsychological consultation due to coping and adjustment issues with extended hospital stay for lingering cognitive and motor deficits.  Below see HPI for the current admission.  HPI:  Shawn Austin is a 56 year old RH-male with history of HTN, T2DM, DDD who was admitted on 09/01/21 with reports of leaning to left with falls, slurring of speech as well as eye deviation to right for a few days. CTA head/neck showed diminutive L-ACA with concerns of  occlusion, moderate stenosis of distal left M1 and proximal right M2 segment . MRI brain done showing small acute ischemia in dorsal lateral right medulla oblongata with chronic ischemic microangiopathy. Stroke felt to be due to small vessel disease in PCA and Dr.Sethi recommended DAPT X 3 weeks followed by ASA alone. He has had issues with elevated BP as well as poorly controlled BS. D3, thins recommended due to slow mastication with mild residue.     While awaiting rehab bed, patient developed perseveration with apraxia LUE, worsening of balance as well as visual deficits on 07/25. MRI brain repeated and showed few new interval/acute infarcts in right perirolandic frontoparietal region with mild increase in size of right medullary infract and no change in edema. Dr. Erlinda Hong felt that new R-MCA stroke concerning for cardioembolic pattern. TEE done to rule out embolic source and showed LVEF 60-65% with moderate  LVH, mild dilation of LA without thrombus, effusion or IA shunt. Zio placed today to monitor for arrhthymias.  ASA increased to 325 mg with plans for DAPT X 3 months followed by ASA alone. Avoid low BP given right M2 occlusion. Therapy has been working with patient who continues to be limited by weakness, ataxia, apraxia, decreased in safety awareness as well as balance deficits. CIR recommended due to functional decline.   Current Status:  Patient was lying in the right lateral recumbent position with face leaning down towards pillow.  Patient was awake but made very little  eye contact.  Patient with lights off in the room.  Patient with very limited interaction verbally but did answer questions with very short replies.  Patient initially stated that he did not know why he was in the hospital but was able to later report that he had had a stroke and was aware that he was on the rehab unit.  Patient's primary concern today had to do with "I want to go home."  Patient appeared to have difficulties with problem  solving, reduction in memory and learning capacity.  I was unable to observe any sitting or standing balance issues.  Patient denied significant depression and reported that it was primary frustration overall and it was fairly apparent that there were limits and his awareness of most of the cognitive and motor changes poststroke.  Patient denied any suicidal or homicidal ideation but reiterated his motivation to complete his hospital stay.  Patient did report his understanding and awareness of why he needed therapies.  Behavioral Observation: Shawn Austin  presents as a 56 y.o.-year-old Right handed African American Male who appeared his stated age. his dress was Appropriate and he was Well Groomed and his manners were Appropriate to the situation.  his participation was indicative of Inattentive and Resistant behaviors.  There were physical disabilities noted.  he displayed an inappropriate level of cooperation and motivation.     Interactions:    Minimal Resistant  Attention:   abnormal and attention span appeared shorter than expected for age  Memory:   abnormal; remote memory intact, recent memory impaired  Visuo-spatial:  abnormal  Speech (Volume):  low  Speech:   slurred; normal  Thought Process:  Circumstantial  Though Content:  WNL; not suicidal and not homicidal  Orientation:   person and place  Judgment:   Fair  Planning:   Poor  Affect:    Depressed, Lethargic, and Resistant  Mood:    Dysphoric  Insight:   Shallow  Intelligence:   normal  Medical History:   Past Medical History:  Diagnosis Date   Anemia    Articular cartilage disorder of hip    Cerebral ischemia    Congenital deformity of right hip joint    Degeneration of cervical intervertebral disc    Diabetes mellitus without complication (HCC)    type 2   Fatigue    Hyperglycemia due to diabetes mellitus (HCC)    Hyperlipidemia    Hypertension    Hypocalcemia    Hyponatremia    Lower limb pain,  inferior, right    Mild cognitive disorder    Pain in joint of left knee    Recurrent falls    Spondylolisthesis of lumbar region    Spondylolisthesis, lumbar region          Patient Active Problem List   Diagnosis Date Noted   Acute stroke of medulla oblongata (HCC) 09/11/2021   Anemia    Acute ischemic stroke (HCC) 09/02/2021   Type 2 diabetes mellitus with hyperlipidemia (HCC) 09/07/2012   Essential hypertension, benign 09/07/2012    Psychiatric History:  No prior psychiatric history  Family Med/Psych History:  Family History  Problem Relation Age of Onset   Cancer Mother    Glaucoma Father     Risk of Suicide/Violence: low   Impression/DX:  Shawn Austin is a 56 year old male with a past history of hypertension, type 2 diabetes, degenerative disc disease.  Patient was admitted on 09/01/2021 with reports of leaning to the left with falls, slurred  speech as well as eye deviation to the right for the prior few days.  CT a head/neck was concerning for concerns of occlusion/moderate stenosis of distal left M1 and proximal right M2 segments.  MRI showed small acute ischemia in dorsolateral right medulla oblongata with chronic ischemic microangiopathic.  Stroke was felt to be due to small vessel disease.  Prior to admission to the inpatient rehab unit patient developed worsening balance as well as visual deficits on 7/25.  MRI brain showed a few new interval/acute infarcts in right frontoparietal region with mild increase in size of right medulla very infarct with no change in edema.  New stroke felt to be due to cardioembolic pattern.  Patient was lying in the right lateral recumbent position with face leaning down towards pillow.  Patient was awake but made very little eye contact.  Patient with lights off in the room.  Patient with very limited interaction verbally but did answer questions with very short replies.  Patient initially stated that he did not know why he was in the  hospital but was able to later report that he had had a stroke and was aware that he was on the rehab unit.  Patient's primary concern today had to do with "I want to go home."  Patient appeared to have difficulties with problem solving, reduction in memory and learning capacity.  I was unable to observe any sitting or standing balance issues.  Patient denied significant depression and reported that it was primary frustration overall and it was fairly apparent that there were limits and his awareness of most of the cognitive and motor changes poststroke.  Patient denied any suicidal or homicidal ideation but reiterated his motivation to complete his hospital stay.  Patient did report his understanding and awareness of why he needed therapies.          Electronically Signed   _______________________ Arley Phenix, Psy.D. Clinical Neuropsychologist

## 2021-09-22 NOTE — Progress Notes (Signed)
Physical Therapy Session Note  Patient Details  Name: Shawn Austin MRN: [id number] Date of Birth: [birth year]  Today's Date: 09/22/2021 PT Individual Time: 4098-1191 PT Individual Time Calculation (min): 70 min   Short Term Goals: Week 1:  PT Short Term Goal 1 (Week 1): Pt will perform sit to stand with LRAD and min A PT Short Term Goal 1 - Progress (Week 1): Met PT Short Term Goal 2 (Week 1): Pt will perform stand pivot transfer with LRAD and mod A PT Short Term Goal 2 - Progress (Week 1): Met PT Short Term Goal 3 (Week 1): Pt will ambulate 50 ft with LRAD and mod A PT Short Term Goal 3 - Progress (Week 1): Progressing toward goal Week 2:  PT Short Term Goal 1 (Week 2): Pt will perform sit to stand with LRAD and CGA PT Short Term Goal 2 (Week 2): Pt will perform stand pivot transfer with LRAD and min A PT Short Term Goal 3 (Week 2): Pt will ambulate 25 feet with mod A and LRAD  Skilled Therapeutic Interventions/Progress Updates:      Therapy Documentation Precautions:  Precautions Precautions: Fall Precaution Comments: R gaze preference, R lateral lean in sitting/standing Restrictions Weight Bearing Restrictions: No  Pt received seated in w/c at bedside and declines pain. Pt agreeable to PT session with emphasis on gait training. Pt propelled w/c approximately 150 ft and was transported the remaining distance to dayroom for time management and energy conservation. Prior to initiation of gait training in lite gait pt reported the need to urinate. PT transported pt to hallway bathroom and pt required mod A x 2 with stand pivot to toilet and pt found to be incontinent of bowel. Pt required max A for donning of briefs and shorts. Pt ambulated ~180 feet in lite gait with mod A at trunk/hips to facilitate weight shift and additional assist for steering. Pt presents with scissoring gait pattern and requires verbal cues to widen base of support and decrease step length. Pt transitioned to  gait training via 3 musketeer technique with tactile cueing to facilitate weight shift and visual cueing for step length. Pt ambulated ~50 ft x 6. Utilized visual cue of dots in a line to facilitate a wider base of support. Pt progressed to ambulation max A x 1 ~80 ft with PT facilitating weight shift. Pt reports he had a good work out following session and was transported to room and required min A with stand pivot and supervision with bed mobility. Pt left semi-reclined in bed with all needs in reach and bed alarm on.    Therapy/Group: Individual Therapy  Verl Dicker 09/22/2021, 7:41 AM

## 2021-09-22 NOTE — Progress Notes (Signed)
PROGRESS NOTE   Subjective/Complaints:  Pt with no new concerns. He says he doesn't like getting woken up early in the AM.   ROS: No CP, SOB, Abdominal pain  Objective:   No results found. Recent Labs    09/22/21 0620  WBC 10.0  HGB 11.4*  HCT 33.1*  PLT 442*    Recent Labs    09/22/21 0620  NA 136  K 3.7  CL 103  CO2 25  GLUCOSE 93  BUN 9  CREATININE 0.99  CALCIUM 8.4*     Intake/Output Summary (Last 24 hours) at 09/22/2021 1157 Last data filed at 09/21/2021 1753 Gross per 24 hour  Intake 240 ml  Output 100 ml  Net 140 ml         Physical Exam: Vital Signs Blood pressure (!) 141/81, pulse 96, temperature 98.4 F (36.9 C), temperature source Oral, resp. rate 18, height 5\' 9"  (1.753 m), weight 82.5 kg, SpO2 99 %.     General: initially sleeping- woke to stimuli briefly. NAD HENT: gaze preference oropharynx moist CV: RRR; no JVD Pulmonary: CTA B/L; no W/R/R- good air movement GI: soft, NT, ND, (+)BS Psychiatric: appropriate Neurological: slowed/sleepy Psych: Pt's affect is appropriate. Pt is cooperative Skin: warm and dry, no breakdown noted Neuro:Speech clear. Keeps eyes turned to the right and able to move to left with some effort. Left lateral rectus weakness. Left central 7. Demonstrates almost a left inattention-improved. Follows commands. Altered Left finger to nose , decreased fine motor movement LLE. Strength 5/5 RUE and RLE and grossly 4 to 4+/5 LUE and LLE. Senses pain in all 4'. No resting tone. Musculoskeletal: No joint swelling or tenderness noted, ROM normal  Assessment/Plan: 1. Functional deficits which require 3+ hours per day of interdisciplinary therapy in a comprehensive inpatient rehab setting. Physiatrist is providing close team supervision and 24 hour management of active medical problems listed below. Physiatrist and rehab team continue to assess barriers to  discharge/monitor patient progress toward functional and medical goals  Care Tool:  Bathing    Body parts bathed by patient: Right arm, Chest, Abdomen, Left arm, Front perineal area, Right upper leg, Left upper leg, Face, Right lower leg, Left lower leg   Body parts bathed by helper: Buttocks     Bathing assist Assist Level: Minimal Assistance - Patient > 75%     Upper Body Dressing/Undressing Upper body dressing   What is the patient wearing?: Pull over shirt    Upper body assist Assist Level: Supervision/Verbal cueing    Lower Body Dressing/Undressing Lower body dressing      What is the patient wearing?: Incontinence brief, Pants     Lower body assist Assist for lower body dressing: Moderate Assistance - Patient 50 - 74%     Toileting Toileting    Toileting assist Assist for toileting: Maximal Assistance - Patient 25 - 49%     Transfers Chair/bed transfer  Transfers assist     Chair/bed transfer assist level: Moderate Assistance - Patient 50 - 74%     Locomotion Ambulation   Ambulation assist      Assist level: Moderate Assistance - Patient 50 - 74% Assistive device: Lite  Gait Max distance: 169 feet   Walk 10 feet activity   Assist     Assist level: 2 helpers Assistive device: Hand held assist, Other (comment) (3 Musketeer)   Walk 50 feet activity   Assist    Assist level: 2 helpers Assistive device: Hand held assist    Walk 150 feet activity   Assist Walk 150 feet activity did not occur: Safety/medical concerns  Assist level:  (unable to perform due to strength, balance and coordiantion deficits)      Walk 10 feet on uneven surface  activity   Assist     Assist level: Maximal Assistance - Patient 25 - 49% Assistive device: Hand held assist, Other (comment) (handrail)   Wheelchair     Assist Is the patient using a wheelchair?: Yes Type of Wheelchair: Manual    Wheelchair assist level: Supervision/Verbal  cueing Max wheelchair distance: 314 feet    Wheelchair 50 feet with 2 turns activity    Assist        Assist Level: Supervision/Verbal cueing   Wheelchair 150 feet activity     Assist      Assist Level: Supervision/Verbal cueing   Blood pressure (!) 141/81, pulse 96, temperature 98.4 F (36.9 C), temperature source Oral, resp. rate 18, height 5\' 9"  (1.753 m), weight 82.5 kg, SpO2 99 %.  Medical Problem List and Plan: 1. Functional deficits secondary to right MCA distribution infarct, likely embolic             -patient may shower             -ELOS/Goals: 8/22, supervision goals  -Improved visual scanning to the left  -Ambulated 55 feet max A x2  Continue CIR- PT, OT and SLP  -Seen by Neruopsych, appreciate assistance  2.  Antithrombotics: -DVT/anticoagulation:  Pharmaceutical: Lovenox             -antiplatelet therapy: DAPT X 3 months followed by ASA 325 mg daily 3. Pain Management:  Tylenol prn.  4. Mood/Behavior/Sleep: LCSW to follow for evaluation and support.              --Trazodone prn for insomnia             -antipsychotic agents: N/A 5. Neuropsych/cognition: This patient is ?capable of making decisions on his own behalf. 6. Skin/Wound Care:  Routine pressure relief measures.  7. Fluids/Electrolytes/Nutrition: Monitor I/O. Check CMET in am. 8. HTN: Monitor BP TID--avoid hypotension with Right M2 occlusion.             --continue Cozaar, Coreg and HCTZ  --Will decrease HCTZ to 12.5mg  due to AKI  -7/31 will increase HCTZ back to 25mg  daily  8/7, few mildly elevated BP, will continue to monitor trend 9. T2DM: Hgb A1c- 8.1 (down from 12). Will continue to monitor BS ac/hs             continue Glipizide and metformin  -Well controlled, continue to monitor for now  -8/1 DC glipizide  -8/4 fair control overall  8/7 Well controlled  10. AKI  -Will decrease HCTZ to 12.5mg   -NS 50 ml/hr startedd 7/28, Cr reviewed and has normalized  -Will DC fluids 7/31,  recheck labs on Wednesday 8/2  -Scr 0.99 8/7, stable, follow with weekly labs 11. HLD  -continue statin, heart healthy diet 12. Urinary incontinence  8/5- will order Timed voiding- q2 hours since cannot tell when going/needs to go or has any control- and cannot feel when wet 13. Constipation  8/5- LBM 4-5+ days ago- d/w pt- insisted needs to take bowel meds or could require disimpaction- pt agreed- will lgive Sorbitol and soap suds enema if no BM  8/6- Had at least 4 BM's- got cleaned out- didn't need enema.  14. Anemia  -HGB 11.4 8/7 , will recheck tomorrow    LOS: 11 days A FACE TO FACE EVALUATION WAS PERFORMED  Fanny Dance 09/22/2021, 11:57 AM

## 2021-09-22 NOTE — Progress Notes (Signed)
Speech Language Pathology Daily Session Note  Patient Details  Name: Shawn Austin MRN: [id number] Date of Birth: 09-Oct-1965  Today's Date: 09/22/2021 SLP Individual Time: 0915-1015 SLP Individual Time Calculation (min): 60 min  Short Term Goals: Week 2: SLP Short Term Goal 1 (Week 2): Patient will demonstrate selective attention to functional tasks in mildly distracting environments with min A verbal redirection cues SLP Short Term Goal 2 (Week 2): Patient will complete mildly complex problem solving tasks with mod A verbal/visual cues to achieve 75% accuracy SLP Short Term Goal 3 (Week 2): Patient will utilize external memory aids to recall functional information with min A verbal cues for effectiveness SLP Short Term Goal 4 (Week 2): Patient will demonstrate awareness to errors and repair with mod A verbal cues SLP Short Term Goal 5 (Week 2): Pt will visually scan left during functional tasks with min A verbal cues during 75% of opportunities  Skilled Therapeutic Interventions:Skilled ST services focused on cognitive skills. SLP facilitated verbal problem solving, recall and left scanning in medication management task. Pt was able to recall current medication with min A verbal cues and supervision A verbal cues utilizing medication list and after education of "chunking" strategy. Pt demonstrated verbal problem solving of medication consumed multiple times per day min A fade to supervision A verbal cues. Pt required correction cuing for scanning left and tracking while reading medication sheet mod A fade to min A verbal cues as pt became more familiar with the visual aid. Pill organizer task was not completed due to time and will be completed in upcoming sessions. Pt demonstrated reduced safety awareness requesting to remove recline beside bed so he " could slide into it when I am ready." SLP provided education on use of call bell function and pt agreed. Pt was left in room with call bell within  reach and chair alarm set. SLP recommends to continue skilled services.     Pain Pain Assessment Pain Scale: 0-10 Pain Score: 0-No pain  Therapy/Group: Individual Therapy  Gaynel Schaafsma  [facility] 09/22/2021, 12:52 PM

## 2021-09-22 NOTE — Progress Notes (Signed)
Occupational Therapy Session Note  Patient Details  Name: Shawn Austin MRN: [id number] Date of Birth: [birth year]  Today's Date: 09/22/2021 OT Individual Time: 1100-1200 OT Individual Time Calculation (min): 60 min    Short Term Goals: Week 1:  OT Short Term Goal 1 (Week 1): Pt will be able to complete squat pivot transfers to toilet with min A. OT Short Term Goal 1 - Progress (Week 1): Progressing toward goal OT Short Term Goal 2 (Week 1): Pt will be able to stand from toilet and hold balance with min A while he pulls pants over hips. OT Short Term Goal 2 - Progress (Week 1): Progressing toward goal OT Short Term Goal 3 (Week 1): Pt will don a shirt with supervision demonstrating improved coordination. OT Short Term Goal 3 - Progress (Week 1): Met OT Short Term Goal 4 (Week 1): Pt will scan to L environment with min cues or less. OT Short Term Goal 4 - Progress (Week 1): Met Week 2:  OT Short Term Goal 1 (Week 2): Pt will be able to perform toilet transfers consistently with min A or less. OT Short Term Goal 2 (Week 2): Pt will be able to hold standning balance with min A or less consistently with only min cues to shift weight to L when he leans to R. OT Short Term Goal 3 (Week 2): Pt will be able to hold a L eye gaze for at least 5 seconds demonstrating improved oculomotor control to scan his environment.    Skilled Therapeutic Interventions/Progress Updates:    Pt received in bed. He is expressing frustration about being here and anxious to leave but willing to work with therapy. Pt worked on squat pivot transfers to wc then to toilet with min A.  No void. Dressed with CGA to stabilize stand as he pulled pants over hips using grab bar for support. Completed oral care at the sink.  Taken to gym and transferred to mat to work on dynamic reach in sitting focusing on extended lengthening to the left.  4# med ball torso twists and modified "kettlebell throw" to work on trunk elongation.   Standing with hands on back of arm chair for support. He continues to have a R lean from pushing off with L foot.   Visual tracking training to L. Improved eye control ability to hold gaze out of midline. With snellen chart R eye is 20/80 (50% correct 20/60) but unable to see image at all with L eye.  Near vision is 20/100 without readers.  Pt taken back to room and belt alarm on with all needs met.   Therapy Documentation Precautions:  Precautions Precautions: Fall Precaution Comments: R gaze preference, R lateral lean in sitting/standing Restrictions Weight Bearing Restrictions: No General:   Vital Signs:  Pain:   ADL: ADL Eating: Set up Grooming: Supervision/safety, Moderate cueing Where Assessed-Grooming: Other (comment) (standing in stedy) Upper Body Bathing: Supervision/safety, Minimal cueing Where Assessed-Upper Body Bathing: Shower Lower Body Bathing: Minimal cueing, Minimal assistance Where Assessed-Lower Body Bathing: Shower Upper Body Dressing: Supervision/safety, Minimal cueing Where Assessed-Upper Body Dressing: Chair Lower Body Dressing: Minimal cueing, Minimal assistance Where Assessed-Lower Body Dressing: Wheelchair Toileting: Maximal assistance (Pt reports need to void bladder. Pt transported back to room to utilize urinal. Pt requires maximal assistance to place urinal, however, pt able to manage clothing to void bladder.) Where Assessed-Toileting: Wheelchair Toilet Transfer: Minimal assistance Toilet Transfer Method: Engineer, water: Bedside commode (BSC over toilet) Social research officer, government:  Minimal assistance Social research officer, government Method: Education officer, environmental: Radio broadcast assistant, Grab bars ADL Comments: pt had difficulty scanning to L due to decreased oculomotor strength   Therapy/Group: Individual Therapy  [place] 09/22/2021, 8:30 AM

## 2021-09-23 DIAGNOSIS — R32 Unspecified urinary incontinence: Secondary | ICD-10-CM

## 2021-09-23 DIAGNOSIS — K5900 Constipation, unspecified: Secondary | ICD-10-CM

## 2021-09-23 LAB — CBC
HCT: 34.1 % — ABNORMAL LOW (ref 39.0–52.0)
Hemoglobin: 11.4 g/dL — ABNORMAL LOW (ref 13.0–17.0)
MCH: 27.6 pg (ref 26.0–34.0)
MCHC: 33.4 g/dL (ref 30.0–36.0)
MCV: 82.6 fL (ref 80.0–100.0)
Platelets: 493 10*3/uL — ABNORMAL HIGH (ref 150–400)
RBC: 4.13 MIL/uL — ABNORMAL LOW (ref 4.22–5.81)
RDW: 12.7 % (ref 11.5–15.5)
WBC: 7 10*3/uL (ref 4.0–10.5)
nRBC: 0 % (ref 0.0–0.2)

## 2021-09-23 LAB — GLUCOSE, CAPILLARY
Glucose-Capillary: 126 mg/dL — ABNORMAL HIGH (ref 70–99)
Glucose-Capillary: 234 mg/dL — ABNORMAL HIGH (ref 70–99)
Glucose-Capillary: 153 mg/dL — ABNORMAL HIGH (ref 70–99)
Glucose-Capillary: 150 mg/dL — ABNORMAL HIGH (ref 70–99)

## 2021-09-23 NOTE — Progress Notes (Addendum)
PROGRESS NOTE   Subjective/Complaints:  In bed this AM. No no new concerns.   ROS: No CP, SOB, Abdominal pain  Objective:   No results found. Recent Labs    09/22/21 0620 09/23/21 0657  WBC 10.0 7.0  HGB 11.4* 11.4*  HCT 33.1* 34.1*  PLT 442* 493*     Recent Labs    09/22/21 0620  NA 136  K 3.7  CL 103  CO2 25  GLUCOSE 93  BUN 9  CREATININE 0.99  CALCIUM 8.4*      Intake/Output Summary (Last 24 hours) at 09/23/2021 1311 Last data filed at 09/23/2021 0800 Gross per 24 hour  Intake 240 ml  Output --  Net 240 ml         Physical Exam: Vital Signs Blood pressure (!) 124/94, pulse 82, temperature 98.1 F (36.7 C), resp. rate 18, height 5\' 9"  (1.753 m), weight 82.5 kg, SpO2 100 %.     General: initially sleeping- woke to stimuli briefly. NAD HENT: gaze preference oropharynx moist CV: RRR; no JVD Pulmonary: CTA B/L; no W/R/R- good air movement GI: soft, NT, ND, (+)BS Psychiatric: appropriate Neurological: slowed/sleepy Psych: Pt's affect is appropriate. Pt is cooperative Skin: warm and dry, no breakdown noted Neuro:Speech clear. Keeps eyes turned to the right and able to move to left with some effort. Left lateral rectus weakness. Left central 7. Demonstrates almost a left inattention-improved. Follows commands. Altered Left finger to nose , decreased fine motor movement LLE. Strength 5/5 RUE and RLE and grossly 4 to 4+/5 LUE and LLE. Senses pain in all 4'. No resting tone. Musculoskeletal: No joint swelling or tenderness noted, ROM normal  Assessment/Plan: 1. Functional deficits which require 3+ hours per day of interdisciplinary therapy in a comprehensive inpatient rehab setting. Physiatrist is providing close team supervision and 24 hour management of active medical problems listed below. Physiatrist and rehab team continue to assess barriers to discharge/monitor patient progress toward  functional and medical goals  Care Tool:  Bathing    Body parts bathed by patient: Right arm, Chest, Abdomen, Left arm, Front perineal area, Right upper leg, Left upper leg, Face, Right lower leg, Left lower leg   Body parts bathed by helper: Buttocks     Bathing assist Assist Level: Minimal Assistance - Patient > 75%     Upper Body Dressing/Undressing Upper body dressing   What is the patient wearing?: Pull over shirt    Upper body assist Assist Level: Supervision/Verbal cueing    Lower Body Dressing/Undressing Lower body dressing      What is the patient wearing?: Incontinence brief, Pants     Lower body assist Assist for lower body dressing: Moderate Assistance - Patient 50 - 74%     Toileting Toileting    Toileting assist Assist for toileting: Maximal Assistance - Patient 25 - 49%     Transfers Chair/bed transfer  Transfers assist     Chair/bed transfer assist level: Minimal Assistance - Patient > 75%     Locomotion Ambulation   Ambulation assist      Assist level: Moderate Assistance - Patient 50 - 74% Assistive device: Walker-rolling Max distance: 300 feet  Walk 10 feet activity   Assist     Assist level: Moderate Assistance - Patient - 50 - 74% Assistive device: Walker-rolling   Walk 50 feet activity   Assist    Assist level: Moderate Assistance - Patient - 50 - 74% Assistive device: Walker-rolling    Walk 150 feet activity   Assist Walk 150 feet activity did not occur: Safety/medical concerns  Assist level: Moderate Assistance - Patient - 50 - 74% Assistive device: Walker-rolling    Walk 10 feet on uneven surface  activity   Assist     Assist level: Maximal Assistance - Patient 25 - 49% Assistive device: Hand held assist, Other (comment) (handrail)   Wheelchair     Assist Is the patient using a wheelchair?: Yes Type of Wheelchair: Manual    Wheelchair assist level: Supervision/Verbal cueing Max wheelchair  distance: 314 feet    Wheelchair 50 feet with 2 turns activity    Assist        Assist Level: Supervision/Verbal cueing   Wheelchair 150 feet activity     Assist      Assist Level: Supervision/Verbal cueing   Blood pressure (!) 124/94, pulse 82, temperature 98.1 F (36.7 C), resp. rate 18, height 5\' 9"  (1.753 m), weight 82.5 kg, SpO2 100 %.  Medical Problem List and Plan: 1. Functional deficits secondary to right MCA distribution infarct, likely embolic             -patient may shower             -ELOS/Goals: 8/22, supervision goals  -Improved visual scanning to the left  -Ambulation 61 feet Mod A 3 musketter  Continue CIR- PT, OT and SLP  -Seen by Neruopsych, appreciate assistance  2.  Antithrombotics: -DVT/anticoagulation:  Pharmaceutical: Lovenox             -antiplatelet therapy: DAPT X 3 months followed by ASA 325 mg daily 3. Pain Management:  Tylenol prn.  4. Mood/Behavior/Sleep: LCSW to follow for evaluation and support.              --Trazodone prn for insomnia             -antipsychotic agents: N/A 5. Neuropsych/cognition: This patient is ?capable of making decisions on his own behalf. 6. Skin/Wound Care:  Routine pressure relief measures.  7. Fluids/Electrolytes/Nutrition: Monitor I/O. Check CMET in am. 8. HTN: Monitor BP TID--avoid hypotension with Right M2 occlusion.             --continue Cozaar, Coreg and HCTZ  --Will decrease HCTZ to 12.5mg  due to AKI  -7/31 will increase HCTZ back to 25mg  daily  8/8 Overall well controlled, continue to monitor 9. T2DM: Hgb A1c- 8.1 (down from 12). Will continue to monitor BS ac/hs             continue Glipizide and metformin  -Well controlled, continue to monitor for now  -8/1 DC glipizide  -8/4 fair control overall  8/7 Well controlled  10. AKI  -Will decrease HCTZ to 12.5mg   -NS 50 ml/hr startedd 7/28, Cr reviewed and has normalized  -Will DC fluids 7/31, recheck labs on Wednesday 8/2  -Scr 0.99 8/7,  stable, follow with weekly labs 11. HLD  -continue statin, heart healthy diet 12. Urinary incontinence  8/5- will order Timed voiding- q2 hours since cannot tell when going/needs to go or has any control- and cannot feel when wet  -8/8 check PVR 13. Constipation  8/5- LBM 4-5+ days ago- d/w  pt- insisted needs to take bowel meds or could require disimpaction- pt agreed- will lgive Sorbitol and soap suds enema if no BM  8/6- Had at least 4 BM's- got cleaned out- didn't need enema.   LBM on 8/7 14. Anemia, mild  -HGB stable at 11.4 today, no signs of bleeding, continue to monitor, consider iron studies with  next labs    LOS: 12 days A FACE TO FACE EVALUATION WAS PERFORMED  Fanny Dance 09/23/2021, 1:11 PM

## 2021-09-23 NOTE — Progress Notes (Signed)
Speech Language Pathology Daily Session Note  Patient Details  Name: Shawn Austin MRN: [id number] Date of Birth: 05/08/1965  Today's Date: 09/23/2021 SLP Individual Time: 1005-1100 SLP Individual Time Calculation (min): 55 min  Short Term Goals: Week 2: SLP Short Term Goal 1 (Week 2): Patient will demonstrate selective attention to functional tasks in mildly distracting environments with min A verbal redirection cues SLP Short Term Goal 2 (Week 2): Patient will complete mildly complex problem solving tasks with mod A verbal/visual cues to achieve 75% accuracy SLP Short Term Goal 3 (Week 2): Patient will utilize external memory aids to recall functional information with min A verbal cues for effectiveness SLP Short Term Goal 4 (Week 2): Patient will demonstrate awareness to errors and repair with mod A verbal cues SLP Short Term Goal 5 (Week 2): Pt will visually scan left during functional tasks with min A verbal cues during 75% of opportunities  Skilled Therapeutic Interventions: Skilled ST treatment focused on cognitive goals. SLP facilitated simulated medication management task involving loading practice medications (beads) into BID pillbox with 60% accuracy given max A verbal cues for use of organizational strategies to minimize risk for error. Pt required total A for awareness of errors x8, and max A for problem solving and reasoning to appropriately repair errors. This task took 50 minutes to load only 3 medications into pillbox. Pt exhibited decreasing frustration tolerance as task progressed. Discussed recommendations for having direct supervision/support with iADLs at discharge, including medication management at discharge. Pt verbalized understanding however unclear of compliance with this d/t decreased insight into deficits. Will need family education prior to discharge. Patient was left in wheelchair with alarm activated and immediate needs within reach at end of session. Continue per  current plan of care.       Pain Pain Assessment Pain Scale: 0-10 Pain Score: 0-No pain  Therapy/Group: Individual Therapy  Tamala Ser 09/23/2021, 10:16 AM

## 2021-09-23 NOTE — Progress Notes (Signed)
Occupational Therapy Session Note  Patient Details  Name: Shawn Austin MRN: [id number] Date of Birth: [birth year]  Today's Date: 09/23/2021 OT Individual Time: 1303-1400 OT Individual Time Calculation (min): 57 min    Short Term Goals: Week 2:  OT Short Term Goal 1 (Week 2): Pt will be able to perform toilet transfers consistently with min A or less. OT Short Term Goal 2 (Week 2): Pt will be able to hold standning balance with min A or less consistently with only min cues to shift weight to L when he leans to R. OT Short Term Goal 3 (Week 2): Pt will be able to hold a L eye gaze for at least 5 seconds demonstrating improved oculomotor control to scan his environment.  Skilled Therapeutic Interventions/Progress Updates:    Pt received sitting in the w/c with no c/o pain, agreeable to OT session. Pt taken to the therapy gym via w/c. He completed several steps with a stand pivot transfer with the RW to the mat with min A, more like mod A d/t R lean when approaching from the R. Head /eye gaze R with L eye severely internally rotated when R eye occluded. He completed a functional reaching task, reaching outside of the BOS to the R and L to challenge midline awareness and visual perception. He requried min facilitation and frequently overshot target. He had several minor R LOB's that worsened with fatigue. He then stood and completed reciprocal stepping activity that he had significant difficulty with alternating weight shift and worsening R lean. He required min A overall and frequent cueing. 3 trials completed. He was then provided with clear glasses that OT fashioned a R gaze block on to provide a constant cue to correct R gaze. He completed the BITS visual scanning activity on while positioned to the R of the screen to facilitate L scanning. 3.86 sec reaction time on the first trial and 2.86 sec on the second. Pt returned to his room and was left sitting up in the w/c with all needs met, chair alarm  set.   Therapy Documentation Precautions:  Precautions Precautions: Fall Precaution Comments: R gaze preference, R lateral lean in sitting/standing Restrictions Weight Bearing Restrictions: No  Therapy/Group: Individual Therapy  [name] 09/23/2021, 6:15 AM

## 2021-09-23 NOTE — Progress Notes (Signed)
Patient ID: Shawn Austin, male   DOB: 06/18/1965, 56 y.o.   MRN: [id number]  Spoke with Katina-girlfriend to inform her he needs more shorts and to talk with him about participating in therapies. He seems to feel they can manage at home. [name] reports he needs to work hard and get as high level as possible she will still need to work. She plans on working form home during the day and in the evenings with her part time work cousin will be home with him. She will speak with him tonight when here.

## 2021-09-23 NOTE — Progress Notes (Addendum)
Physical Therapy Session Note  Patient Details  Name: Shawn Austin MRN: [id number] Date of Birth: Jun 25, 1965  Today's Date: 09/23/2021 PT Individual Time: 0900-1005 PT Individual Time Calculation (min): 65 min   Short Term Goals: Week 1:  PT Short Term Goal 1 (Week 1): Pt will perform sit to stand with LRAD and min A PT Short Term Goal 1 - Progress (Week 1): Met PT Short Term Goal 2 (Week 1): Pt will perform stand pivot transfer with LRAD and mod A PT Short Term Goal 2 - Progress (Week 1): Met PT Short Term Goal 3 (Week 1): Pt will ambulate 50 ft with LRAD and mod A PT Short Term Goal 3 - Progress (Week 1): Progressing toward goal Week 2:  PT Short Term Goal 1 (Week 2): Pt will perform sit to stand with LRAD and CGA PT Short Term Goal 2 (Week 2): Pt will perform stand pivot transfer with LRAD and min A PT Short Term Goal 3 (Week 2): Pt will ambulate 25 feet with mod A and LRAD  Skilled Therapeutic Interventions/Progress Updates:      Therapy Documentation Precautions:  Precautions Precautions: Fall Precaution Comments: R gaze preference, R lateral lean in sitting/standing Restrictions Weight Bearing Restrictions: No  Pt received seated in recliner at bedside and declines pain. Pt requires CGA with upper and lower body dressing and able to don shoes in standing with RW. Pr requires CGA for sit to stand and min A with stand pivot transfer with RW. Pt propelled w/c ~150 ft supervision and transported the remaining distance via w/c for time management and energy conservation. PT session with emphasis on high intensity gait training. Pt ambulated with 3 musketeer training strategy 2 x 25 feet with visual cue of line on floor via tape to facilitate wide base of support with ambulation as pt presents with scissoring gait pattern. Pt progressed from 3 musketeer technique to ambulation with RW with mod A 61 feet x 4. Pt requires tactile cueing at pelvis to facilitate weight shift to the left  with gait training. PT tech provided visual cue on right to encourage increased base of support with ambulation. Pt also requires verbal and tactile cueing for RW management. Pt left in care of ST.    Therapy/Group: Individual Therapy  Verl Dicker Verl Dicker PT, DPT  09/23/2021, 7:27 AM

## 2021-09-24 DIAGNOSIS — R32 Unspecified urinary incontinence: Secondary | ICD-10-CM

## 2021-09-24 LAB — GLUCOSE, CAPILLARY
Glucose-Capillary: 150 mg/dL — ABNORMAL HIGH (ref 70–99)
Glucose-Capillary: 165 mg/dL — ABNORMAL HIGH (ref 70–99)
Glucose-Capillary: 134 mg/dL — ABNORMAL HIGH (ref 70–99)
Glucose-Capillary: 142 mg/dL — ABNORMAL HIGH (ref 70–99)

## 2021-09-24 MED ORDER — SENNOSIDES-DOCUSATE SODIUM 8.6-50 MG PO TABS
1.0000 | ORAL_TABLET | Freq: Every day | ORAL | Status: DC
  Administered 2021-09-24 – 2021-09-28 (×5): 1 via ORAL
  Filled 2021-09-24 (×5): qty 1

## 2021-09-24 NOTE — Progress Notes (Signed)
PROGRESS NOTE   Subjective/Complaints:  In bed this AM.  Some improvement in urine continence. Reports he has some return of sensation of bladder fullness before urination.  ROS: No CP, SOB, Abdominal pain, N/V/D/C  Objective:   No results found. Recent Labs    09/22/21 0620 09/23/21 0657  WBC 10.0 7.0  HGB 11.4* 11.4*  HCT 33.1* 34.1*  PLT 442* 493*     Recent Labs    09/22/21 0620  NA 136  K 3.7  CL 103  CO2 25  GLUCOSE 93  BUN 9  CREATININE 0.99  CALCIUM 8.4*      Intake/Output Summary (Last 24 hours) at 09/24/2021 0803 Last data filed at 09/24/2021 0500 Gross per 24 hour  Intake 238 ml  Output 400 ml  Net -162 ml         Physical Exam: Vital Signs Blood pressure (!) 151/95, pulse 83, temperature 98.1 F (36.7 C), resp. rate 19, height 5\' 9"  (1.753 m), weight 82.5 kg, SpO2 99 %.     General: Alert. NAD HENT: gaze preference oropharynx moist CV: RRR; no JVD Pulmonary: CTA B/L; no W/R/R- good air movement GI: soft, NT, ND, (+)BS Psychiatric: appropriate Neurological: alert and awake Psych: Pt's affect is appropriate. Pt is cooperative Skin: warm and dry, no breakdown noted Neuro:Speech clear. Keeps eyes turned to the right and able to move to left with some effort. Left lateral rectus weakness. Left central 7. Demonstrates almost a left inattention-improved. Follows commands. Altered Left finger to nose , decreased fine motor movement LLE. Strength 5/5 RUE and RLE and grossly 4 to 4+/5 LUE and LLE. Senses pain in all 4'. No resting tone. Musculoskeletal: No joint swelling or tenderness noted, ROM normal  Assessment/Plan: 1. Functional deficits which require 3+ hours per day of interdisciplinary therapy in a comprehensive inpatient rehab setting. Physiatrist is providing close team supervision and 24 hour management of active medical problems listed below. Physiatrist and rehab team continue  to assess barriers to discharge/monitor patient progress toward functional and medical goals  Care Tool:  Bathing    Body parts bathed by patient: Right arm, Chest, Abdomen, Left arm, Front perineal area, Right upper leg, Left upper leg, Face, Right lower leg, Left lower leg   Body parts bathed by helper: Buttocks     Bathing assist Assist Level: Minimal Assistance - Patient > 75%     Upper Body Dressing/Undressing Upper body dressing   What is the patient wearing?: Pull over shirt    Upper body assist Assist Level: Supervision/Verbal cueing    Lower Body Dressing/Undressing Lower body dressing      What is the patient wearing?: Incontinence brief, Pants     Lower body assist Assist for lower body dressing: Moderate Assistance - Patient 50 - 74%     Toileting Toileting    Toileting assist Assist for toileting: Maximal Assistance - Patient 25 - 49%     Transfers Chair/bed transfer  Transfers assist     Chair/bed transfer assist level: Minimal Assistance - Patient > 75%     Locomotion Ambulation   Ambulation assist      Assist level: Moderate Assistance - Patient  50 - 74% Assistive device: Walker-rolling Max distance: 300 feet   Walk 10 feet activity   Assist     Assist level: Moderate Assistance - Patient - 50 - 74% Assistive device: Walker-rolling   Walk 50 feet activity   Assist    Assist level: Moderate Assistance - Patient - 50 - 74% Assistive device: Walker-rolling    Walk 150 feet activity   Assist Walk 150 feet activity did not occur: Safety/medical concerns  Assist level: Moderate Assistance - Patient - 50 - 74% Assistive device: Walker-rolling    Walk 10 feet on uneven surface  activity   Assist     Assist level: Maximal Assistance - Patient 25 - 49% Assistive device: Hand held assist, Other (comment) (handrail)   Wheelchair     Assist Is the patient using a wheelchair?: Yes Type of Wheelchair: Manual     Wheelchair assist level: Supervision/Verbal cueing Max wheelchair distance: 314 feet    Wheelchair 50 feet with 2 turns activity    Assist        Assist Level: Supervision/Verbal cueing   Wheelchair 150 feet activity     Assist      Assist Level: Supervision/Verbal cueing   Blood pressure (!) 151/95, pulse 83, temperature 98.1 F (36.7 C), resp. rate 19, height 5\' 9"  (1.753 m), weight 82.5 kg, SpO2 99 %.  Medical Problem List and Plan: 1. Functional deficits secondary to right MCA distribution infarct, likely embolic             -patient may shower             -ELOS/Goals: 8/22, supervision goals  -Improved visual scanning to the left  -Ambulation 61 feet Mod A 3 musketter  Continue CIR- PT, OT and SLP  -Seen by Neruopsych, appreciate assistance  -Team conference today  2.  Antithrombotics: -DVT/anticoagulation:  Pharmaceutical: Lovenox             -antiplatelet therapy: DAPT X 3 months followed by ASA 325 mg daily 3. Pain Management:  Tylenol prn.  4. Mood/Behavior/Sleep: LCSW to follow for evaluation and support.              --Trazodone prn for insomnia             -antipsychotic agents: N/A 5. Neuropsych/cognition: This patient is ?capable of making decisions on his own behalf. 6. Skin/Wound Care:  Routine pressure relief measures.  7. Fluids/Electrolytes/Nutrition: Monitor I/O. Check CMET in am. 8. HTN: Monitor BP TID--avoid hypotension with Right M2 occlusion.             --continue Cozaar, Coreg and HCTZ  --Will decrease HCTZ to 12.5mg  due to AKI  -7/31 will increase HCTZ back to 25mg  daily  -8/9 intermittently elevated, monitor tend 9. T2DM: Hgb A1c- 8.1 (down from 12). Will continue to monitor BS ac/hs             continue Glipizide and metformin  -Well controlled, continue to monitor for now  -8/1 DC glipizide  -CBG fair, consider increase metformin  10. AKI  -Will decrease HCTZ to 12.5mg   -NS 50 ml/hr startedd 7/28, Cr reviewed and has  normalized  -Will DC fluids 7/31, recheck labs on Wednesday 8/2  -Scr 0.99 8/7, stable, follow with weekly labs 11. HLD  -continue statin, heart healthy diet 12. Urinary incontinence  8/5- will order Timed voiding- q2 hours since cannot tell when going/needs to go or has any control- and cannot feel when wet  -8/8  check PVR  8/9 continence improving, continue timed voids 13. Constipation  8/5- LBM 4-5+ days ago- d/w pt- insisted needs to take bowel meds or could require disimpaction- pt agreed- will lgive Sorbitol and soap suds enema if no BM  8/6- Had at least 4 BM's- got cleaned out- didn't need enema.   LBM on 8/7  -8/9 start senokot 14. Anemia, mild  -HGB stable at 11.4 today, no signs of bleeding, continue to monitor, consider iron studies with  next labs friday  -Recheck Friday  -Will check occult stool    LOS: 13 days A FACE TO FACE EVALUATION WAS PERFORMED  Jennye Boroughs 09/24/2021, 8:03 AM

## 2021-09-24 NOTE — Patient Care Conference (Signed)
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 Fanny Dance Social Worker Present: Dossie Der, LCSW Nurse Present: Other (comment) Regino Schultze, RN) PT Present: Other (comment) Truitt Leep, PT) OT Present: Jake Shark, OT SLP Present: Feliberto Gottron, SLP PPS Coordinator present : Fae Pippin, SLP     Current Status/Progress Goal Weekly Team Focus  Bowel/Bladder   intermittent incontinence. LBM - 8/7  Regain continence  offer toileting q 2-4 hrs & PRN   Swallow/Nutrition/ Hydration             ADL's   Severe R gaze preference, min A to stand with R lean, min A stand pivot transfers, occasionally requires mod A. Mod A LB ADls, (S) UB  supervision overall  Visual perception/scanning, ADLs, midline orientation, coordination, transfers   Mobility   Supervision bed mobility. CGA STS. Min A stand pivot. 61 ft x 4 with RW (mod A)  supervision overall  transfers + gait with RW, pre-gait activities ( L weight shifting), pt/family education   Communication             Safety/Cognition/ Behavioral Observations  mod-to-max A - poor insight into deficits, impaired problem solving, error awareness, frustration tolerance. Rec 24 hour supervision and support with iADLs. Do not anticipate pt will be safe with managing medications based on requiring max A for med management tasks  across several sessions  min A (may need to consider downgrade)  problem solving, memory, selective attention, error awareness, visual scanning   Pain   Denies pain  Remain pain free  Asses pain q shift & PRN   Skin   Skin Intact  Remain free of skin breakdown  Assess skin q shift & PRN     Discharge Planning:  Girlfriend to work from home and be there during the day when she goes to part time evening job cousin will be there with pt.   Team Discussion: Patient admitted with CVA. Intermittently continent of bowel and bladder, improved. Denies pain, skin intact. Patient on target to meet rehab goals: yes  *See Care Plan and progress notes for long and short-term goals.   Revisions to Treatment Plan:  None  Teaching Needs: Family education, bowel and bladder management, safety  Current Barriers to Discharge: Decreased caregiver support  Possible Resolutions to Barriers: Family education, consistent participation in therapy education     Medical Summary               I attest that I was present, lead the team conference, and concur with the assessment and plan of the team.   Bluford Kaufmann 09/24/2021, 5:12 PM

## 2021-09-24 NOTE — Progress Notes (Signed)
Occupational Therapy Session Note  Patient Details  Name: Shawn Austin MRN: [id number] Date of Birth: Jul 01, 1965  Session 1  Today's Date: 09/24/2021 OT Individual Time: 4431-5400 OT Individual Time Calculation (min): 60 min    Session 2  Today's Date: 09/24/2021 OT Individual Time: 1425-1505 OT Individual Time Calculation (min): 40 min    Short Term Goals: Week 2:  OT Short Term Goal 1 (Week 2): Pt will be able to perform toilet transfers consistently with min A or less. OT Short Term Goal 2 (Week 2): Pt will be able to hold standning balance with min A or less consistently with only min cues to shift weight to L when he leans to R. OT Short Term Goal 3 (Week 2): Pt will be able to hold a L eye gaze for at least 5 seconds demonstrating improved oculomotor control to scan his environment.  Skilled Therapeutic Interventions/Progress Updates:    Session 1 Pt received supine with no c/o pain, agreeable to OT session. He transferred to the EOB with (S), ataxic movements but overall safe with use of the bed rails. He sat and ate breakfast quickly. OT noticed pt's ZIO monitor was laying in the bed next to pt. Reviewed cardiology notes and pt one day short of 14 day monitor recommendation. Confirmed with MD that ZIO was to be replaced as best as possible. Pt agreeable to take shower with encouragement/education on therapeutic benefit. Pt donned clear glasses with R gaze block before completing functional mobility into the bathroom with the RW. He required heavy mod A as his R lean worsened. Pt with poor awareness of lean and he continued to attempt and take steps as he had almost full LOB. Cueing required for awareness of midline and body monitoring. He completed shower from the TTB. He required min A while standing to complete peri cleansing. Otherwise UB washed seated with close (S). He required no cueing for R attention to body. He required compensatory head turn with cueing to locate self care  items on the L side of the shower. He transferred out of shower and to w/c following with min A. He completed oral care at the sink with slow pace to correct for visual perceptual deficits when reaching for objects. He donned a shirt with (S) and pants with min A. He reported need to urinate but was quickly incontinent thereafter. Zio replaced and secured with tegaderm on the wings as discussed with MD. Pt was left sitting up in the w/c with all needs met, chair alarm set, and call bell within reach.     Session 2 Pt received sitting up in the w/c with no c/o pain. Pt completed functional mobility into the bathroom with the RW with min A initially, multiple instances of mod A to correct heavy R lean. Extra time and heavy cueing for midline awareness. Pt completed urine void in standing with mod A. Pt completed clothing management with min A. He completed functional mobility back to the w/c. He propelled w/c to the therapy gym with min cueing for visual perception. In the therapy gym he worked on functional reaching to the L to reduce R lean and to promote L visual attention and scanning. He completed 3 trials with mod cueing for midline awareness. He returned to his room and was left supine with all needs met, bed alarm set.    Therapy Documentation Precautions:  Precautions Precautions: Fall Precaution Comments: R gaze preference, R lateral lean in sitting/standing Restrictions Weight Bearing Restrictions:  No    Therapy/Group: Individual Therapy  [name] 09/24/2021, 6:41 AM

## 2021-09-24 NOTE — Progress Notes (Addendum)
Physical Therapy Session Note  Patient Details  Name: Shawn Austin MRN: [id number] Date of Birth: May 16, 1965  Today's Date: 09/24/2021 PT Individual Time: 1029-1113 PT Individual Time Calculation (min): 44 min   Short Term Goals: Week 1:  PT Short Term Goal 1 (Week 1): Pt will perform sit to stand with LRAD and min A PT Short Term Goal 1 - Progress (Week 1): Met PT Short Term Goal 2 (Week 1): Pt will perform stand pivot transfer with LRAD and mod A PT Short Term Goal 2 - Progress (Week 1): Met PT Short Term Goal 3 (Week 1): Pt will ambulate 50 ft with LRAD and mod A PT Short Term Goal 3 - Progress (Week 1): Progressing toward goal  Skilled Therapeutic Interventions/Progress Updates:      Therapy Documentation Precautions:  Precautions Precautions: Fall Precaution Comments: R gaze preference, R lateral lean in sitting/standing Restrictions Weight Bearing Restrictions: No  Pt received seated in w/c at bedside and declines pain. Pt wore right gaze blocker throughout session today. Pt propelled w/c ~150 ft with bilateral UE and supervision for safety. PT session with focus of high intensity gait training to improve independence with ambulation to prepare for d/c home. Pt ambulated total of 220 feet with RW. Pt progressed from mod to min A with gait with RW with verbal and visual cues for adequate postural alignment and weight shift. Pt required additional assist from therapy tech to guide RW as pt has difficulty keeping the walker ahead of him as he ambulates. Pt continues to require mod A with turns with RW during gait as pt presents with narrowed base of support and difficulty with walker management.  Pt transported to room and left seated in w/c at bedside with chair alarm on and all needs with reach.    Therapy/Group: Individual Therapy  Verl Dicker Verl Dicker PT, DPT  09/24/2021, 7:41 AM

## 2021-09-24 NOTE — Progress Notes (Signed)
Speech Language Pathology Daily Session Note  Patient Details  Name: Shawn Austin MRN: [id number] Date of Birth: [birth year]  Today's Date: 09/24/2021 SLP Individual Time: 0901-1000 SLP Individual Time Calculation (min): 59 min  Short Term Goals: Week 2: SLP Short Term Goal 1 (Week 2): Patient will demonstrate selective attention to functional tasks in mildly distracting environments with min A verbal redirection cues SLP Short Term Goal 2 (Week 2): Patient will complete mildly complex problem solving tasks with mod A verbal/visual cues to achieve 75% accuracy SLP Short Term Goal 3 (Week 2): Patient will utilize external memory aids to recall functional information with min A verbal cues for effectiveness SLP Short Term Goal 4 (Week 2): Patient will demonstrate awareness to errors and repair with mod A verbal cues SLP Short Term Goal 5 (Week 2): Pt will visually scan left during functional tasks with min A verbal cues during 75% of opportunities  Skilled Therapeutic Interventions: Skilled ST treatment focused on cognitive goals. Pt was received upright in wheelchair on arrival. Although awake, pt kept eyes closed for majority of session. Required extended processing time and encouragement to participate. Difficult to decipher if he sleepy or uninterested. Pt appeared mildly frustrated at times.   SLP facilitated verbal reasoning and attention skills by generating words within a concrete category based on each letter of the alphabet. Pt completed task with min A verbal cues for recall, min A verbal cues for maintaining sequence of alphabet, min A verbal (semantic, categorical) cues to generate content, and mod A verbal cues for attention and persistence to task within a mildly distracting environment.   Pt reported he feels he will be ready for discharge within the next 2 days and will be prepared to discharge at "pretty much" independent with limited insight into current deficits and limitations  s/p CVA. Discussed anticipated discharge date of 8/22. Pt stated "that's way too long" and "I don't need all that." Discussed there will be ongoing discussion regarding discharge needs with treatment team.   Patient was left in wheelchair  with alarm activated and immediate needs within reach at end of session. Continue per current plan of care.       Pain None/denied  Therapy/Group: Individual Therapy  [name] 09/24/2021, 9:15 AM

## 2021-09-24 NOTE — Progress Notes (Signed)
Patient ID: Shawn Austin, male   DOB: Jun 27, 1965, 56 y.o.   MRN: [id number]  Met with pt and left message for [name] to update both regarding team conference progress this week and discharge still 8/22. Awaiting [name] to get back with me regarding arranging family education for next week. She still needs to bring pt some shorts he doesn't like pants and his other ones are dirty. Will continue to work on discharge needs. Pt is not sleeping well and MD was made aware of this.

## 2021-09-25 LAB — GLUCOSE, CAPILLARY
Glucose-Capillary: 122 mg/dL — ABNORMAL HIGH (ref 70–99)
Glucose-Capillary: 175 mg/dL — ABNORMAL HIGH (ref 70–99)
Glucose-Capillary: 141 mg/dL — ABNORMAL HIGH (ref 70–99)
Glucose-Capillary: 112 mg/dL — ABNORMAL HIGH (ref 70–99)

## 2021-09-25 MED ORDER — OXYBUTYNIN CHLORIDE 5 MG PO TABS
5.0000 mg | ORAL_TABLET | Freq: Every day | ORAL | Status: DC
  Administered 2021-09-25 – 2021-09-26 (×2): 5 mg via ORAL
  Filled 2021-09-25 (×2): qty 1

## 2021-09-25 MED ORDER — POLYETHYLENE GLYCOL 3350 17 G PO PACK
17.0000 g | PACK | Freq: Every day | ORAL | Status: DC
  Filled 2021-09-25 (×4): qty 1

## 2021-09-25 MED ORDER — OXYBUTYNIN CHLORIDE 5 MG PO TABS
5.0000 mg | ORAL_TABLET | Freq: Two times a day (BID) | ORAL | Status: DC
Start: 2021-09-25 — End: 2021-09-25

## 2021-09-25 NOTE — Progress Notes (Signed)
Occupational Therapy Session Note  Patient Details  Name: Shawn Austin MRN: [id number] Date of Birth: 1966/01/25  Today's Date: 09/25/2021 OT Individual Time: 1303-1420 OT Individual Time Calculation (min): 77 min    Short Term Goals: Week 1:  OT Short Term Goal 1 (Week 1): Pt will be able to complete squat pivot transfers to toilet with min A. OT Short Term Goal 1 - Progress (Week 1): Progressing toward goal OT Short Term Goal 2 (Week 1): Pt will be able to stand from toilet and hold balance with min A while he pulls pants over hips. OT Short Term Goal 2 - Progress (Week 1): Progressing toward goal OT Short Term Goal 3 (Week 1): Pt will don a shirt with supervision demonstrating improved coordination. OT Short Term Goal 3 - Progress (Week 1): Met OT Short Term Goal 4 (Week 1): Pt will scan to L environment with min cues or less. OT Short Term Goal 4 - Progress (Week 1): Met Week 2:  OT Short Term Goal 1 (Week 2): Pt will be able to perform toilet transfers consistently with min A or less. OT Short Term Goal 2 (Week 2): Pt will be able to hold standning balance with min A or less consistently with only min cues to shift weight to L when he leans to R. OT Short Term Goal 3 (Week 2): Pt will be able to hold a L eye gaze for at least 5 seconds demonstrating improved oculomotor control to scan his environment.    Skilled Therapeutic Interventions/Progress Updates:    Pt received in wc having just finished lunch. Pt agreeable to trying to toilet. Min A stand pivot transfers with bar, pt stood with CGA to manage clothing over hips.  Pt sat for several minutes but no void.    Pt taken to gym to work on postural control focusing on WB on L side and L trunk elongation to avoid R lean.  1st ex at mirror with pt holding onto side of parallel bars. Pt worked on sliding L hand to L on bars for forced wt shift, he also worked on stepping L foot out laterally onto a bench step in a lunge for forced  wt bearing through leg.  Pt then sat on mat and worked on numerous reps of reaching L with trunk elongation with hand on top of a tall hockey stick pushing stick to the side.  Integrated this movement with sit to stands. As pt stood, he had to lean hockey stick away from him.   Standing with hand support on tall bar stool, pt used mirror for feedback and worked on him self correcting himself to midline when he leaned R.  B sh strength with isometric holding of stick with arms at 90 and then integrating torso twists.   Pt transferred back to w/c and returned to room. Pt opted to get back to bed.  Min with transfers. Pt in bed with all needs met and alarm set.   Therapy Documentation Precautions:  Precautions Precautions: Fall Precaution Comments: R gaze preference, R lateral lean in sitting/standing Restrictions Weight Bearing Restrictions: No  Pain: Pain Assessment Pain Scale: 0-10 Pain Score: 0-No pain    Therapy/Group: Individual Therapy  Twain 09/25/2021, 11:56 AM

## 2021-09-25 NOTE — Progress Notes (Signed)
PROGRESS NOTE   Subjective/Complaints:  Pt asks about DC date. Continues to have bladder incontinence with frequent urge to urinate and urination soon after.   LBM on 8/7  ROS: No CP, SOB, Abdominal pain, N/V/D/C, HA  Objective:   No results found. Recent Labs    09/23/21 0657  WBC 7.0  HGB 11.4*  HCT 34.1*  PLT 493*     No results for input(s): "NA", "K", "CL", "CO2", "GLUCOSE", "BUN", "CREATININE", "CALCIUM" in the last 72 hours.    Intake/Output Summary (Last 24 hours) at 09/25/2021 1050 Last data filed at 09/24/2021 2133 Gross per 24 hour  Intake 357 ml  Output 200 ml  Net 157 ml         Physical Exam: Vital Signs Blood pressure (!) 138/94, pulse 88, temperature 98.5 F (36.9 C), resp. rate 18, height 5\' 9"  (1.753 m), weight 82.5 kg, SpO2 99 %.     General: Alert. NAD HENT: gaze preference oropharynx moist CV: RRR; no JVD Pulmonary: CTA B/L; no W/R/R- good air movement GI: soft, NT, ND, (+)BS Psychiatric: appropriate Neurological: alert and awake Psych: Pt's affect is appropriate. Pt is cooperative Skin: warm and dry, no breakdown noted Neuro:Speech clear. Keeps eyes turned to the right and able to move to left with some effort. Left lateral rectus weakness. Left central 7. Demonstrates almost a left inattention-improved. Follows commands. Altered Left finger to nose , decreased fine motor movement LLE. Strength 5/5 RUE and RLE and grossly 4 to 4+/5 LUE and LLE. Senses pain in all 4'. No resting tone. Musculoskeletal: No joint swelling or tenderness noted, ROM normal  Assessment/Plan: 1. Functional deficits which require 3+ hours per day of interdisciplinary therapy in a comprehensive inpatient rehab setting. Physiatrist is providing close team supervision and 24 hour management of active medical problems listed below. Physiatrist and rehab team continue to assess barriers to discharge/monitor  patient progress toward functional and medical goals  Care Tool:  Bathing    Body parts bathed by patient: Right arm, Chest, Abdomen, Left arm, Front perineal area, Right upper leg, Left upper leg, Face, Right lower leg, Left lower leg, Buttocks   Body parts bathed by helper: Buttocks     Bathing assist Assist Level: Minimal Assistance - Patient > 75%     Upper Body Dressing/Undressing Upper body dressing   What is the patient wearing?: Pull over shirt    Upper body assist Assist Level: Supervision/Verbal cueing    Lower Body Dressing/Undressing Lower body dressing      What is the patient wearing?: Incontinence brief, Pants     Lower body assist Assist for lower body dressing: Minimal Assistance - Patient > 75%     Toileting Toileting    Toileting assist Assist for toileting: Minimal Assistance - Patient > 75%     Transfers Chair/bed transfer  Transfers assist     Chair/bed transfer assist level: Moderate Assistance - Patient 50 - 74%     Locomotion Ambulation   Ambulation assist      Assist level: Minimal Assistance - Patient > 75% Assistive device: Walker-rolling Max distance: 300 feet   Walk 10 feet activity   Assist  Assist level: Minimal Assistance - Patient > 75% Assistive device: Walker-rolling   Walk 50 feet activity   Assist    Assist level: Moderate Assistance - Patient - 50 - 74% Assistive device: Walker-rolling    Walk 150 feet activity   Assist Walk 150 feet activity did not occur: Safety/medical concerns  Assist level: Moderate Assistance - Patient - 50 - 74% Assistive device: Walker-rolling    Walk 10 feet on uneven surface  activity   Assist     Assist level: Maximal Assistance - Patient 25 - 49% Assistive device: Hand held assist, Other (comment) (handrail)   Wheelchair     Assist Is the patient using a wheelchair?: Yes Type of Wheelchair: Manual    Wheelchair assist level: Supervision/Verbal  cueing Max wheelchair distance: 314 feet    Wheelchair 50 feet with 2 turns activity    Assist        Assist Level: Supervision/Verbal cueing   Wheelchair 150 feet activity     Assist      Assist Level: Supervision/Verbal cueing   Blood pressure (!) 138/94, pulse 88, temperature 98.5 F (36.9 C), resp. rate 18, height 5\' 9"  (1.753 m), weight 82.5 kg, SpO2 99 %.  Medical Problem List and Plan: 1. 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  Antithrombotics: -DVT/anticoagulation:  Pharmaceutical: Lovenox             -antiplatelet therapy: DAPT X 3 months followed by ASA 325 mg daily 3. Pain Management:  Tylenol prn.  4. Mood/Behavior/Sleep: LCSW to follow for evaluation and support.              --Trazodone prn for insomnia             -antipsychotic agents: N/A 5. Neuropsych/cognition: This patient is ?capable of making decisions on his own behalf. 6. Skin/Wound Care:  Routine pressure relief measures.  7. Fluids/Electrolytes/Nutrition: Monitor I/O. Check CMET in am. 8. HTN: Monitor BP TID--avoid hypotension with Right M2 occlusion.             --continue Cozaar, Coreg and HCTZ  --Will decrease HCTZ to 12.5mg  due to AKI  -7/31 will increase HCTZ back to 25mg  daily  -8/9 intermittently elevated, monitor tend 9. T2DM: Hgb A1c- 8.1 (down from 12). Will continue to monitor BS ac/hs             continue Glipizide and metformin  -Well controlled, continue to monitor for now  -8/1 DC glipizide  -CBG fairly good control, continue metformin  10. AKI  -Will decrease HCTZ to 12.5mg   -NS 50 ml/hr startedd 7/28, Cr reviewed and has normalized  -Will DC fluids 7/31, recheck labs on Wednesday 8/2  -Scr 0.99 8/7, stable, follow with weekly labs 11. HLD  -continue statin, heart  healthy diet 12. Urinary incontinence  8/5- will order Timed voiding- q2 hours since cannot tell when going/needs to go or has any control- and cannot feel when wet  8/9 continence improving, continue timed voids  8/10 start oxybutynin 5mg , PVR 13. Constipation  8/5- LBM 4-5+ days ago- d/w pt- insisted needs to take bowel meds or could require disimpaction- pt agreed- will lgive Sorbitol and soap suds enema if no  BM  8/6- Had at least 4 BM's- got cleaned out- didn't need enema.   -Continue senokot, Schedule miralax 14. Anemia, mild  -HGB stable at 11.4 today, no signs of bleeding, continue to monitor, consider iron studies with  next labs friday  -Recheck Friday  -Will check occult stool    LOS: 14 days A FACE TO FACE EVALUATION WAS PERFORMED  Fanny Dance 09/25/2021, 10:50 AM

## 2021-09-25 NOTE — Progress Notes (Signed)
Speech Language Pathology Daily Session Note  Patient Details  Name: ELMOR KOST MRN: [id number] Date of Birth: 15-Dec-1965  Today's Date: 09/25/2021 SLP Individual Time: 1435-1535 SLP Individual Time Calculation (min): 60 min  Short Term Goals: Week 2: SLP Short Term Goal 1 (Week 2): Patient will demonstrate selective attention to functional tasks in mildly distracting environments with min A verbal redirection cues SLP Short Term Goal 2 (Week 2): Patient will complete mildly complex problem solving tasks with mod A verbal/visual cues to achieve 75% accuracy SLP Short Term Goal 3 (Week 2): Patient will utilize external memory aids to recall functional information with min A verbal cues for effectiveness SLP Short Term Goal 4 (Week 2): Patient will demonstrate awareness to errors and repair with mod A verbal cues SLP Short Term Goal 5 (Week 2): Pt will visually scan left during functional tasks with min A verbal cues during 75% of opportunities  Skilled Therapeutic Interventions: Skilled ST treatment focused on cognitive goals. SLP facilitated alternating attention, visual scanning, and working memory task with max fading to min A for organization and alternating attention; min A verbal cues for visual scanning left of midline; and min A for using external memory aid for functional recall. SLP then facilitated conversational working memory task with min A verbal cues to achieve 75% accuracy; pt requiring mod-to-max A verbal cues to recall novel information following 10 minute delay without use of external aids; min A with use of external aids. Patient passed off to NT at end of session. Continue per current plan of care.      Pain None/denied  Therapy/Group: Individual Therapy  Tamala Ser 09/25/2021, 2:59 PM

## 2021-09-25 NOTE — Progress Notes (Signed)
Removed Zio monitor per Dr. Benjie Karvonen. Returned Zio to [employer] per monitor instructions and placed it in the unsealed return envelope provided. Called pts significant other, [name] to notify her that the Zio monitor is ready for her to mail back to company. Placed unsealed envelope in drawer under pts closet per Katina's request since she stated that will be the easiest place for her to find it. Pt aware as well.

## 2021-09-25 NOTE — Progress Notes (Signed)
Physical Therapy Session Note  Patient Details  Name: Shawn Austin MRN: [id number] Date of Birth: [birth year]  Today's Date: 09/25/2021 PT Individual Time: 0859-1001 PT Individual Time Calculation (min): 62 min   Short Term Goals: Week 1:  PT Short Term Goal 1 (Week 1): Pt will perform sit to stand with LRAD and min A PT Short Term Goal 1 - Progress (Week 1): Met PT Short Term Goal 2 (Week 1): Pt will perform stand pivot transfer with LRAD and mod A PT Short Term Goal 2 - Progress (Week 1): Met PT Short Term Goal 3 (Week 1): Pt will ambulate 50 ft with LRAD and mod A PT Short Term Goal 3 - Progress (Week 1): Progressing toward goal Week 2:  PT Short Term Goal 1 (Week 2): Pt will perform sit to stand with LRAD and CGA PT Short Term Goal 2 (Week 2): Pt will perform stand pivot transfer with LRAD and min A PT Short Term Goal 3 (Week 2): Pt will ambulate 25 feet with mod A and LRAD  Skilled Therapeutic Interventions/Progress Updates:      Therapy Documentation Precautions:  Precautions Precautions: Fall Precaution Comments: R gaze preference, R lateral lean in sitting/standing Restrictions Weight Bearing Restrictions: No   Pt received semi-reclined in bed and agreeable to PT session. Pt declines pain. Pt requires min A for lower body dressing. Pt ambulated 10 ft with RW to toilet to adhere to timed toilet schedule and did not void or have a bowel movement. Pt requires mod A to ambulate ~90 ft with RW and w/c follow. Pt requires frequent verbal cueing for foot placement and weight shift to the left. Pt transported remaining distance to ortho gym and participated in blocked practice of stand step turn transfers with RW. Pt progressed from mod to min A with transfers and requires verbal cues to maintain midline alignment. Pt performed alternating step up's on 3 inch step ( 2 x 10) to prepare for stair negotiation as pt will need to demonstrate safe technique prior to discharge home. Pt  ambulated additional ~80 ft and was transported remaining distance to room. Pt reported the need to void and urinated in standing with bilateral support on grab bars and CGA for balance. PT updated timed toileting sheet. Pt left seated in w/c at bedside with chair alarm on and all needs in reach.   Therapy/Group: Individual Therapy  Verl Dicker Verl Dicker PT, DPT  09/25/2021, 7:31 AM

## 2021-09-26 LAB — CBC
HCT: 34.2 % — ABNORMAL LOW (ref 39.0–52.0)
Hemoglobin: 11.8 g/dL — ABNORMAL LOW (ref 13.0–17.0)
MCH: 28.1 pg (ref 26.0–34.0)
MCHC: 34.5 g/dL (ref 30.0–36.0)
MCV: 81.4 fL (ref 80.0–100.0)
Platelets: 512 10*3/uL — ABNORMAL HIGH (ref 150–400)
RBC: 4.2 MIL/uL — ABNORMAL LOW (ref 4.22–5.81)
RDW: 12.4 % (ref 11.5–15.5)
WBC: 5.2 10*3/uL (ref 4.0–10.5)
nRBC: 0 % (ref 0.0–0.2)

## 2021-09-26 LAB — GLUCOSE, CAPILLARY
Glucose-Capillary: 136 mg/dL — ABNORMAL HIGH (ref 70–99)
Glucose-Capillary: 148 mg/dL — ABNORMAL HIGH (ref 70–99)
Glucose-Capillary: 124 mg/dL — ABNORMAL HIGH (ref 70–99)
Glucose-Capillary: 120 mg/dL — ABNORMAL HIGH (ref 70–99)

## 2021-09-26 LAB — IRON AND TIBC
Iron: 62 ug/dL (ref 45–182)
Saturation Ratios: 26 % (ref 17.9–39.5)
TIBC: 237 ug/dL — ABNORMAL LOW (ref 250–450)
UIBC: 175 ug/dL

## 2021-09-26 LAB — FERRITIN: Ferritin: 343 ng/mL — ABNORMAL HIGH (ref 24–336)

## 2021-09-26 MED ORDER — OXYBUTYNIN CHLORIDE 5 MG PO TABS
5.0000 mg | ORAL_TABLET | Freq: Three times a day (TID) | ORAL | Status: DC
  Administered 2021-09-26 – 2021-10-01 (×15): 5 mg via ORAL
  Filled 2021-09-26 (×17): qty 1

## 2021-09-26 NOTE — Progress Notes (Signed)
Speech Language Pathology Weekly Progress and Session Note  Patient Details  Name: Shawn Austin MRN: [id number] Date of Birth: 25-Jan-1966  Beginning of progress report period: September 19, 2021 End of progress report period: September 26, 2021  Today's Date: 09/26/2021 SLP Individual Time: 1430-1530 SLP Individual Time Calculation (min): 60 min  Short Term Goals: Week 2: SLP Short Term Goal 1 (Week 2): Patient will demonstrate selective attention to functional tasks in mildly distracting environments with min A verbal redirection cues SLP Short Term Goal 1 - Progress (Week 2): Met SLP Short Term Goal 2 (Week 2): Patient will complete mildly complex problem solving tasks with mod A verbal/visual cues to achieve 75% accuracy SLP Short Term Goal 2 - Progress (Week 2): Met SLP Short Term Goal 3 (Week 2): Patient will utilize external memory aids to recall functional information with min A verbal cues for effectiveness SLP Short Term Goal 3 - Progress (Week 2): Met SLP Short Term Goal 4 (Week 2): Patient will demonstrate awareness to errors and repair with mod A verbal cues SLP Short Term Goal 4 - Progress (Week 2): Not met SLP Short Term Goal 5 (Week 2): Pt will visually scan left during functional tasks with min A verbal cues during 75% of opportunities SLP Short Term Goal 5 - Progress (Week 2): Met  New Short Term Goals: Week 3: SLP Short Term Goal 1 (Week 3): STG=LTG due to ELOS  Weekly Progress Updates: Patient has made functional gains and has met 4 of 5 STGs this reporting period. Patient is currently completing cognitive tasks with overall mod A verbal + visual cues in regards to problem solving and awareness; min A for functional recall with use of external aids, min A for visual scanning, min A for attention, and max A for error awareness. Pt continues to exhibit impaired safety awareness and poor judgement of abilities. Patient and family education is ongoing. Patient would benefit from  continued skilled SLP intervention to maximize cognitive functioning and overall functional independence prior to discharge.     Intensity: Minumum of 1-2 x/day, 30 to 90 minutes Frequency: 3 to 5 out of 7 days Duration/Length of Stay: 8/22 Treatment/Interventions: Cognitive remediation/compensation;Internal/external aids;Functional tasks;Patient/family education;Therapeutic Activities  Daily Session Skilled Therapeutic Interventions: Skilled ST treatment focused on cognitive goals. Pt received in wheelchair with head resting on bedside table. Pt kept eyes closed or hands covering face for majority of session making limited to no eye contact with therapist.  Pt discussed eagerness to discharge from hospital as soon as possible to "get back to my life." SLP facilitated functional discussion with focus on emergent + anticipatory awareness regarding needs in home environment and community. Pt continues to exhibit impaired safety awareness and poor judgement of abilities, essentially communicating he is back to his baseline and will not require any level of assistance at discharge. Accompanied by encouragement and validation on areas of improvement, SLP facilitated further discussion on safety awareness and problem solving skills with at least mod A verbal cues.   SLP facilitated ALFA subtests: solving daily math problems with 6/10 accurate progressing to 9/10 with min A verbal cues, and counting money with 5/10 accurate progressing to 10/10 with extended time and moderate verbal + visual cues.  Patient was left in wheelchair with alarm activated and immediate needs within reach at end of session. Pt requested to be positioned in a way that will be close to his bed so he can get into bed when he's ready. SLP providing  reminder for need to press call bell for assistance with transfers. Pt verbalized understanding. Continue per current plan of care.      General    Pain  None/denied  Therapy/Group:  Individual Therapy  Patty Sermons 09/26/2021, 5:34 PM

## 2021-09-26 NOTE — Progress Notes (Signed)
Occupational Therapy Session Note  Patient Details  Name: Shawn Austin MRN: [id number] Date of Birth: 09-Oct-1965  Today's Date: 09/26/2021 OT Individual Time: 1050-1205 OT Individual Time Calculation (min): 75 min    Short Term Goals: Week 2:  OT Short Term Goal 1 (Week 2): Pt will be able to perform toilet transfers consistently with min A or less. OT Short Term Goal 1 - Progress (Week 2): Met OT Short Term Goal 2 (Week 2): Pt will be able to hold standning balance with min A or less consistently with only min cues to shift weight to L when he leans to R. OT Short Term Goal 2 - Progress (Week 2): Met OT Short Term Goal 3 (Week 2): Pt will be able to hold a L eye gaze for at least 5 seconds demonstrating improved oculomotor control to scan his environment. OT Short Term Goal 3 - Progress (Week 2): Met  Skilled Therapeutic Interventions/Progress Updates:    Pt received in wc with head resting on bedside table sleeping. Pt stated he was feeling really tired. Proved him with a diet Coke to help him with energy for this session.  Pt taken to gym to work on numerous NMR exercises focusing on trunk elongation and reach to the L, midline awareness and control, L visual scanning, B UE coordination and LE coordination. -squat pivot to mat followed pt working on continuous sq pivots down mat to his L and then to his R 6x  -rolling hula hoop out to side to left, lifting hoop overhead for sh control and then "steering the wheel" to work on smooth movements - with hands on bench in front of pt, pt worked on lifting hips and then pushing into bench with wt shifts to his left. Added on pushing into bench and then rising to stand with no UE support. At top of stand he would drift to R so added an arm chair to his L. Once pt stood he tapped chairback with L hand to help him find his midline. Numerous reps and pt did well -5# dowel bar for sh and elb strength working on midline control as he did over head  lifts - colored circles on the floor he had to tap with his toes (in sitting) for LE coordination  Excellent participation and focus in therapy today.  Pt is making strong progress. Returned to room and pivoted to toilet.  NT present to take over as pt felt he needed to sit for a few minutes.   Therapy Documentation Precautions:  Precautions Precautions: Fall Precaution Comments: R gaze preference, R lateral lean in sitting/standing Restrictions Weight Bearing Restrictions: No Therapy Vitals Pulse Rate: 89 BP: 106/73 Pain: Pain Assessment Pain Score: 0-No pain     Therapy/Group: Individual Therapy  [place] 09/26/2021, 12:39 PM

## 2021-09-26 NOTE — Progress Notes (Signed)
Occupational Therapy Weekly Progress Note  Patient Details  Name: Shawn Austin MRN: [id number] Date of Birth: 03-Jul-1965  Beginning of progress report period: September 19, 2021 End of progress report period: September 26, 2021   Patient has met 3 of 3 short term goals.  [name] has made great progress in the last week, progressing to min A overall with ADLs and ADL transfers (not including longer gait). He still has a severe R gaze preference for which clear R peripheral blocking glasses have been provided and trialed with great response. He still has impaired safety awareness and poor judgement of abilities. During longer gait he can still require heavy mod A when his R lean worsens. Family education is currently being scheduled.   Patient continues to demonstrate the following deficits: muscle weakness, impaired timing and sequencing, unbalanced muscle activation, ataxia, decreased coordination, and decreased motor planning, decreased visual acuity, decreased visual perceptual skills, and decreased visual motor skills, decreased midline orientation and decreased attention to right, decreased initiation, decreased attention, decreased awareness, decreased problem solving, decreased safety awareness, decreased memory, and delayed processing, and decreased sitting balance, decreased standing balance, decreased postural control, and decreased balance strategies and therefore will continue to benefit from skilled OT intervention to enhance overall performance with BADL and Reduce care partner burden.  Patient progressing toward long term goals..  Continue plan of care.  OT Short Term Goals Week 2:  OT Short Term Goal 1 (Week 2): Pt will be able to perform toilet transfers consistently with min A or less. OT Short Term Goal 1 - Progress (Week 2): Met OT Short Term Goal 2 (Week 2): Pt will be able to hold standning balance with min A or less consistently with only min cues to shift weight to L when he leans  to R. OT Short Term Goal 2 - Progress (Week 2): Met OT Short Term Goal 3 (Week 2): Pt will be able to hold a L eye gaze for at least 5 seconds demonstrating improved oculomotor control to scan his environment. OT Short Term Goal 3 - Progress (Week 2): Met Week 3:  OT Short Term Goal 1 (Week 3): Pt will complete toilet transfers with CGA OT Short Term Goal 2 (Week 3): Pt will require no more than min cueing to maintain midline orientation during ambulatory transfer to the bathroom OT Short Term Goal 3 (Week 3): Pt will complete LB ADLs with (S) overall   [name] 09/26/2021, 10:28 AM

## 2021-09-26 NOTE — Progress Notes (Signed)
PROGRESS NOTE   Subjective/Complaints:  Pt asks for therapy to start a little later in the AM.  Reports decreased bladder urge after trying oxybutynin.   LBM on 8/7  ROS: No CP, SOB, Abdominal pain, N/V/D/C, HA + GU urgency and incontinence  Objective:   No results found. Recent Labs    09/26/21 0535  WBC 5.2  HGB 11.8*  HCT 34.2*  PLT 512*     No results for input(s): "NA", "K", "CL", "CO2", "GLUCOSE", "BUN", "CREATININE", "CALCIUM" in the last 72 hours.    Intake/Output Summary (Last 24 hours) at 09/26/2021 1101 Last data filed at 09/25/2021 1900 Gross per 24 hour  Intake 338 ml  Output 100 ml  Net 238 ml         Physical Exam: Vital Signs Blood pressure 106/73, pulse 89, temperature 98 F (36.7 C), resp. rate 16, height 5\' 9"  (1.753 m), weight 82.9 kg, SpO2 99 %.     General: Alert. NAD HENT: gaze preference oropharynx moist CV: RRR; no JVD Pulmonary: CTA B/L; no W/R/R- good air movement GI: soft, NT, ND, (+)BS Psychiatric: appropriate, appears frustrated Neurological: alert and awake Psych: Pt's affect is appropriate. Pt is cooperative Skin: warm and dry, no breakdown noted Neuro:Speech clear. Keeps eyes turned to the right and able to move to left with some effort. Left lateral rectus weakness. Left central 7. Demonstrates almost a left inattention-improved. Follows commands. Altered Left finger to nose , decreased fine motor movement LLE. Strength 5/5 RUE and RLE and grossly 4 to 4+/5 LUE and LLE. Senses pain in all 4'. No resting tone. Musculoskeletal: No joint swelling or tenderness noted, ROM normal  Assessment/Plan: 1. Functional deficits which require 3+ hours per day of interdisciplinary therapy in a comprehensive inpatient rehab setting. Physiatrist is providing close team supervision and 24 hour management of active medical problems listed below. Physiatrist and rehab team continue to  assess barriers to discharge/monitor patient progress toward functional and medical goals  Care Tool:  Bathing    Body parts bathed by patient: Right arm, Chest, Abdomen, Left arm, Front perineal area, Right upper leg, Left upper leg, Face, Right lower leg, Left lower leg, Buttocks   Body parts bathed by helper: Buttocks     Bathing assist Assist Level: Minimal Assistance - Patient > 75%     Upper Body Dressing/Undressing Upper body dressing   What is the patient wearing?: Pull over shirt    Upper body assist Assist Level: Supervision/Verbal cueing    Lower Body Dressing/Undressing Lower body dressing      What is the patient wearing?: Incontinence brief, Pants     Lower body assist Assist for lower body dressing: Minimal Assistance - Patient > 75%     Toileting Toileting    Toileting assist Assist for toileting: Minimal Assistance - Patient > 75%     Transfers Chair/bed transfer  Transfers assist     Chair/bed transfer assist level: Minimal Assistance - Patient > 75%     Locomotion Ambulation   Ambulation assist      Assist level: Moderate Assistance - Patient 50 - 74% Assistive device: Walker-rolling Max distance: 300 feet   Walk  10 feet activity   Assist     Assist level: Moderate Assistance - Patient - 50 - 74% Assistive device: Walker-rolling   Walk 50 feet activity   Assist    Assist level: Moderate Assistance - Patient - 50 - 74% Assistive device: Walker-rolling    Walk 150 feet activity   Assist Walk 150 feet activity did not occur: Safety/medical concerns  Assist level: Moderate Assistance - Patient - 50 - 74% Assistive device: Walker-rolling    Walk 10 feet on uneven surface  activity   Assist     Assist level: Maximal Assistance - Patient 25 - 49% Assistive device: Hand held assist, Other (comment) (handrail)   Wheelchair     Assist Is the patient using a wheelchair?: Yes Type of Wheelchair: Manual     Wheelchair assist level: Supervision/Verbal cueing Max wheelchair distance: 314 feet    Wheelchair 50 feet with 2 turns activity    Assist        Assist Level: Supervision/Verbal cueing   Wheelchair 150 feet activity     Assist      Assist Level: Supervision/Verbal cueing   Blood pressure 106/73, pulse 89, temperature 98 F (36.7 C), resp. rate 16, height 5\' 9"  (1.753 m), weight 82.9 kg, SpO2 99 %.  Medical Problem List and Plan: 1. 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  Antithrombotics: -DVT/anticoagulation:  Pharmaceutical: Lovenox             -antiplatelet therapy: DAPT X 3 months followed by ASA 325 mg daily 3. Pain Management:  Tylenol prn.  4. Mood/Behavior/Sleep: LCSW to follow for evaluation and support.              --Trazodone prn for insomnia             -antipsychotic agents: N/A 5. Neuropsych/cognition: This patient is ?capable of making decisions on his own behalf. 6. Skin/Wound Care:  Routine pressure relief measures.  7. Fluids/Electrolytes/Nutrition: Monitor I/O. Check CMET in am. 8. HTN: Monitor BP TID--avoid hypotension with Right M2 occlusion.             --continue Cozaar, Coreg and HCTZ  --Will decrease HCTZ to 12.5mg  due to AKI  -7/31 will increase HCTZ back to 25mg  daily  -8/11 last BP 106/73, he is intermittently high but will hold off on increasing antihypertensives for now to avoid hypotension, continue to monitor 9. T2DM: Hgb A1c- 8.1 (down from 12). Will continue to monitor BS ac/hs             continue Glipizide and metformin  -Well controlled, continue to monitor for now  -8/1 DC glipizide  -CBG well controlled, continue current  10. AKI  -Will decrease HCTZ to  12.5mg   -NS 50 ml/hr startedd 7/28, Cr reviewed and has normalized  -Will DC fluids 7/31, recheck labs on Wednesday 8/2  -Scr 0.99 8/7, stable, follow with weekly labs 11. HLD  -continue statin, heart healthy diet 12. Urinary incontinence  8/5- will order Timed voiding- q2 hours since cannot tell when going/needs to go or has any control- and cannot feel when wet  8/9 continence  improving, continue timed voids  8/11 He had 1 dose oxybutynin yesterday, seem likely he had benefit and had continent episode, will increase oxybutynin to 5mg  TID, monitor PVR 13. Constipation  8/5- LBM 4-5+ days ago- d/w pt- insisted needs to take bowel meds or could require disimpaction- pt agreed- will lgive Sorbitol and soap suds enema if no BM  8/6- Had at least 4 BM's- got cleaned out- didn't need enema.   -Continue senokot, Schedule miralax  -LBM today, improved 14. Anemia, mild  -HGB a little improved to 11.8, stable, no signs of bleeding  -Will check occult stool    LOS:  15 days A FACE TO FACE EVALUATION WAS PERFORMED  09/26/2021, 11:01 AM

## 2021-09-26 NOTE — Progress Notes (Signed)
Physical Therapy Session Note  Patient Details  Name: Shawn Austin MRN: [id number] Date of Birth: 1965/04/28  Today's Date: 09/26/2021 PT Individual Time: 0801-0913 PT Individual Time Calculation (min): 72 min   Short Term Goals: Week 1:  PT Short Term Goal 1 (Week 1): Pt will perform sit to stand with LRAD and min A PT Short Term Goal 1 - Progress (Week 1): Met PT Short Term Goal 2 (Week 1): Pt will perform stand pivot transfer with LRAD and mod A PT Short Term Goal 2 - Progress (Week 1): Met PT Short Term Goal 3 (Week 1): Pt will ambulate 50 ft with LRAD and mod A PT Short Term Goal 3 - Progress (Week 1): Progressing toward goal Week 2:  PT Short Term Goal 1 (Week 2): Pt will perform sit to stand with LRAD and CGA PT Short Term Goal 2 (Week 2): Pt will perform stand pivot transfer with LRAD and min A PT Short Term Goal 3 (Week 2): Pt will ambulate 25 feet with mod A and LRAD  Skilled Therapeutic Interventions/Progress Updates:      Therapy Documentation Precautions:  Precautions Precautions: Fall Precaution Comments: R gaze preference, R lateral lean in sitting/standing Restrictions Weight Bearing Restrictions: No   Pt received seated edge of bed and is agreeable to PT session. Pt declines pain and wore right gaze blockers on glasses throughout session.  Pt performed upper/lower body dressing with min A and able to pull up shorts in standing with RW and CGA. Pt expressed need to void and transported to bathroom via w/c. Pt performed sit to stand with grab bars and CGA and unable to urinate. Pt propelled w/c ~160 ft to ortho gym with supervision. Pt requires min A with stand pivot with RW to mat table. Pt participated in sit to stand transfers while holding a 1.1# ball in front of him to encourage anterior weight shift with sit to stands (2 x 6). Pt required external and tactile cues for anterior and lateral weight shift with transfers. Pt performed lateral weight shifting in  sitting by weight bearing through forearm and extending elbow to return back to midline ( 1 x10). Pt ambulated 30 ft with RW mod A and reported need to void. Pt transported to room and performed sit to stand with grab bars CGA and voided. Timed toileting clock updated by PT. Pt continues to require max A for turns with gait and practiced ambulation of 10 ft x 4 with emphasis on turning with RW. Pt requires external verbal cue "do not let feet touch" and visual cues to increase base of support with turns. Pt left seated in w/c at bedside with chair alarm on and all needs within reach.     Therapy/Group: Individual Therapy  Verl Dicker Verl Dicker PT, DPT  09/26/2021, 7:24 AM

## 2021-09-26 NOTE — Progress Notes (Signed)
Patient ID: Shawn Austin, male   DOB: [birth year], 56 y.o.   MRN: [id number]  Have left another message with Katina-pt's girlfriend regarding scheduling family education next week prior to his discharge home. Await her return call.

## 2021-09-26 NOTE — Progress Notes (Signed)
Physical Therapy Weekly Progress Note  Patient Details  Name: Shawn Austin MRN: [id number] Date of Birth: [birth year]  Beginning of progress report period: September 19, 2021 End of progress report period: September 26, 2021  Patient has met 6 of 6 short term goals.  Pt is making steady progress to long term goals due to improvements in strength, balance, coordination, motor control, postural control, and activity tolerance. Pt largely limited due to left inattention deficits. Pt requires supervision with bed mobility, CGA with sit to stand transfers with RW, min A with stand pivot transfers with RW and mod A x 1 with gait with RW. Plan to continue patient education and initiate family training and stair training as pt nears discharge.   Patient continues to demonstrate the following deficits muscle weakness, impaired timing and sequencing, unbalanced muscle activation, ataxia, decreased coordination, and decreased motor planning, decreased visual acuity, decreased visual perceptual skills, and decreased visual motor skills, decreased midline orientation, decreased attention to left, and decreased motor planning, decreased initiation, decreased attention, decreased awareness, decreased problem solving, decreased safety awareness, decreased memory, and delayed processing, and decreased sitting balance, decreased standing balance, decreased postural control, and decreased balance strategies and therefore will continue to benefit from skilled PT intervention to increase functional independence with mobility.  Patient progressing toward long term goals..  Continue plan of care.  PT Short Term Goals Week 1:  PT Short Term Goal 1 (Week 1): Pt will perform sit to stand with LRAD and min A PT Short Term Goal 1 - Progress (Week 1): Met PT Short Term Goal 2 (Week 1): Pt will perform stand pivot transfer with LRAD and mod A PT Short Term Goal 2 - Progress (Week 1): Met PT Short Term Goal 3 (Week 1): Pt will  ambulate 50 ft with LRAD and mod A PT Short Term Goal 3 - Progress (Week 1): Progressing toward goal Week 2:  PT Short Term Goal 1 (Week 2): Pt will perform sit to stand with LRAD and CGA PT Short Term Goal 1 - Progress (Week 2): Met PT Short Term Goal 2 (Week 2): Pt will perform stand pivot transfer with LRAD and min A PT Short Term Goal 2 - Progress (Week 2): Met PT Short Term Goal 3 (Week 2): Pt will ambulate 25 feet with mod A and LRAD PT Short Term Goal 3 - Progress (Week 2): Met PT Short Term Goal 5 (Week 2): Pt will perform sit to stand with SBA Week 3:  PT Short Term Goal 1 (Week 3): Pt will perform stand pivot transfer with CGA PT Short Term Goal 2 (Week 3): Pt will ambulate 25 ft with min A  Skilled Therapeutic Interventions/Progress Updates:  Ambulation/gait training;Discharge planning;DME/adaptive equipment instruction;Functional mobility training;Psychosocial support;Therapeutic Activities;UE/LE Strength taining/ROM;Visual/perceptual remediation/compensation;Balance/vestibular training;Community reintegration;Disease management/prevention;Neuromuscular re-education;Patient/family education;Stair training;Therapeutic Exercise;UE/LE Coordination activities;Wheelchair propulsion/positioning   Therapy Documentation Precautions:  Precautions Precautions: Fall Precaution Comments: R gaze preference, R lateral lean in sitting/standing Restrictions Weight Bearing Restrictions: No    Therapy/Group: Individual Therapy  Verl Dicker Verl Dicker PT, DPT  09/26/2021, 7:38 AM

## 2021-09-26 NOTE — Plan of Care (Signed)
  Problem: Consults Goal: RH STROKE PATIENT EDUCATION Description: See Patient Education module for education specifics  Outcome: Progressing   Problem: RH BOWEL ELIMINATION Goal: RH STG MANAGE BOWEL WITH ASSISTANCE Description: STG Manage Bowel with  mod I Assistance. Outcome: Progressing Goal: RH STG MANAGE BOWEL W/MEDICATION W/ASSISTANCE Description: STG Manage Bowel with Medication with mod I Assistance. Outcome: Progressing   Problem: RH BLADDER ELIMINATION Goal: RH STG MANAGE BLADDER WITH ASSISTANCE Description: STG Manage Bladder With mod I  and toileting Assistance Outcome: Progressing   Problem: RH SAFETY Goal: RH STG ADHERE TO SAFETY PRECAUTIONS W/ASSISTANCE/DEVICE Description: STG Adhere to Safety Precautions With cues Assistance/Device. Outcome: Progressing   Problem: RH KNOWLEDGE DEFICIT Goal: RH STG INCREASE KNOWLEDGE OF DIABETES Description: Patient and significant other will be able to manage DM with medications and dietary modifications using handouts and educational resources independently  Outcome: Progressing Goal: RH STG INCREASE KNOWLEDGE OF HYPERTENSION Description: Patient and significant other will be able to manage HTN with medications and dietary modifications using handouts and educational resources independently  Outcome: Progressing Goal: RH STG INCREASE KNOWLEGDE OF HYPERLIPIDEMIA Description: Patient and significant other will be able to manage HLD with medications and dietary modifications using handouts and educational resources independently  Outcome: Progressing Goal: RH STG INCREASE KNOWLEDGE OF STROKE PROPHYLAXIS Description: Patient and significant other will be able to manage secondary risks with medications and dietary modifications using handouts and educational resources independently  Outcome: Progressing   Problem: RH Vision Goal: RH LTG Vision (Specify) Outcome: Progressing

## 2021-09-27 LAB — GLUCOSE, CAPILLARY
Glucose-Capillary: 129 mg/dL — ABNORMAL HIGH (ref 70–99)
Glucose-Capillary: 166 mg/dL — ABNORMAL HIGH (ref 70–99)
Glucose-Capillary: 118 mg/dL — ABNORMAL HIGH (ref 70–99)
Glucose-Capillary: 120 mg/dL — ABNORMAL HIGH (ref 70–99)

## 2021-09-27 NOTE — Progress Notes (Signed)
Occupational Therapy Session Note  Patient Details  Name: Shawn Austin MRN: [id number] Date of Birth: 1966/01/09  Today's Date: 09/27/2021 OT Individual Time: 7062-3762 OT Individual Time Calculation (min): 57 min    Short Term Goals: Week 3:  OT Short Term Goal 1 (Week 3): Pt will complete toilet transfers with CGA OT Short Term Goal 2 (Week 3): Pt will require no more than min cueing to maintain midline orientation during ambulatory transfer to the bathroom OT Short Term Goal 3 (Week 3): Pt will complete LB ADLs with (S) overall  Skilled Therapeutic Interventions/Progress Updates:  Pt asleep in w/c with head on the table upon OT arrival to the room. Pt reports, "I just want to ride in my w/c. I am tired today." Pt in agreement for OT session.  Therapy Documentation Precautions:  Precautions Precautions: Fall Precaution Comments: R gaze preference, R lateral lean in sitting/standing Restrictions Weight Bearing Restrictions: No Vital Signs: Please see "Flowsheet" for most recent vitals charted by nursing staff.  Pain: Pain Assessment Pain Scale: 0-10 Pain Score: 0-No pain  ADL: Pt reports completing ADL prior to OT session and declines need to perform ADL's at this time.   Visual Perceptual Skill: Pt participates in therapeutic activity in order to improve visual perceptual skills and visual scanning to the L side while propelling the w/c. OT placed suction cup "Squiggs" in the hallway on B sides (placed on L > R to improve visual scanning to the L). Pt able to propel self in the w/c in the hallway with close supervision for safety due to 1-2 small collisions in the w/c on the L side. Pt encouraged to perform visual scanning while propelling to locate objects that were placed on the wall. Pt able to locate all objects on the R, however, requires moderate VC's to locate objects on the L side due to R side visual preference and gaze throughout task. Pt requires a compensatory  head turn in order to locate objects that are placed in the L visual field.   Once outside, pt participates in visual scanning tasks to locate objects within the L visual field. Pt requires minimal prompts to locate 5/5 objects in the garden that is located in pt's L visual field.   W/C Propulsion: Pt participates in w/c propulsion in order to improve safety with mobility tasks at w/c level and improve BUE strength/endurance skills needed for community/household mobility. Pt able to propel w/c from room > elevators (~150') with close supervision - minimal assistance to avoid small collisions on the L within a busy hallway.   Environmental Navigation: Pt participates in environmental navigation task in order to improve environmental awareness, visual scanning, and recall skills that are needed for pre-community mobility navigation. Education provided to pt on techniques to utilize cues within the environment to improve independence with environmental navigation. OT provides total assistance to navigate outdoors, but encourages pt to utilize these techniques in order to participate in navigation back to room. Pt requires moderate VC's and assist to correctly navigate back to the room. Pt pt appeared unsure of the correct route to go, pt noted to choose the route more in R visual field than on L. However, when pt was noted to have improved recall and confidence in the route pt was able to report need to navigate to the L.   Swallowing Safety: Once returned to room, RN has placed a new drink in pt's room. Pt began taking drinks out of a straw and cough  after drinks. Education provided to pt on safe swallowing techniques to take smaller drinks in order to protect airway. Pt then able to take a smaller drink with no coughing noted.    Pt requested to stay in the w/c at end of session. Pt left sitting comfortably in the w/c with personal belongings and call light within reach, belt alarm placed and activated,  and comfort needs attended to. Phone rang with OT providing assistance to answer and pt talking on the phone when OT exits room.   Therapy/Group: Individual Therapy  Lavera Guise 09/27/2021, 10:32 AM

## 2021-09-27 NOTE — Progress Notes (Signed)
PROGRESS NOTE   Subjective/Complaints:  No issues overnite , pt is looking forward to a light therapy day  ROS: No CP, SOB, Abdominal pain, N/V/D/C,    Objective:   No results found. Recent Labs    09/26/21 0535  WBC 5.2  HGB 11.8*  HCT 34.2*  PLT 512*     No results for input(s): "NA", "K", "CL", "CO2", "GLUCOSE", "BUN", "CREATININE", "CALCIUM" in the last 72 hours.    Intake/Output Summary (Last 24 hours) at 09/27/2021 0827 Last data filed at 09/27/2021 0700 Gross per 24 hour  Intake 240 ml  Output 525 ml  Net -285 ml         Physical Exam: Vital Signs Blood pressure (!) 157/90, pulse 77, temperature 98.3 F (36.8 C), temperature source Oral, resp. rate 18, height 5\' 9"  (1.753 m), weight 82.9 kg, SpO2 100 %.   General: No acute distress Mood and affect are appropriate Heart: Regular rate and rhythm no rubs murmurs or extra sounds Lungs: Clear to auscultation, breathing unlabored, no rales or wheezes Abdomen: Positive bowel sounds, soft nontender to palpation, nondistended Extremities: No clubbing, cyanosis, or edema Skin: No evidence of breakdown, no evidence of rash   Psych: Pt's affect is appropriate. Pt is cooperative  Neuro:Speech clear. Keeps eyes turned to the right and able to move to left with some effort. Left lateral rectus weakness. Left central 7. Demonstrates almost a left inattention-improved. Follows commands. Altered Left finger to nose , decreased fine motor movement LLE. Strength 5/5 RUE and RLE and grossly 4 to 4+/5 LUE and LLE. Senses pain in all 4'. No resting tone. Musculoskeletal: No joint swelling or tenderness noted, ROM normal  Assessment/Plan: 1. Functional deficits which require 3+ hours per day of interdisciplinary therapy in a comprehensive inpatient rehab setting. Physiatrist is providing close team supervision and 24 hour management of active medical problems listed  below. Physiatrist and rehab team continue to assess barriers to discharge/monitor patient progress toward functional and medical goals  Care Tool:  Bathing    Body parts bathed by patient: Right arm, Chest, Abdomen, Left arm, Front perineal area, Right upper leg, Left upper leg, Face, Right lower leg, Left lower leg, Buttocks   Body parts bathed by helper: Buttocks     Bathing assist Assist Level: Minimal Assistance - Patient > 75%     Upper Body Dressing/Undressing Upper body dressing   What is the patient wearing?: Pull over shirt    Upper body assist Assist Level: Supervision/Verbal cueing    Lower Body Dressing/Undressing Lower body dressing      What is the patient wearing?: Incontinence brief, Pants     Lower body assist Assist for lower body dressing: Minimal Assistance - Patient > 75%     Toileting Toileting    Toileting assist Assist for toileting: Minimal Assistance - Patient > 75%     Transfers Chair/bed transfer  Transfers assist     Chair/bed transfer assist level: Minimal Assistance - Patient > 75%     Locomotion Ambulation   Ambulation assist      Assist level: Moderate Assistance - Patient 50 - 74% Assistive device: Walker-rolling Max distance: 300 feet  Walk 10 feet activity   Assist     Assist level: Moderate Assistance - Patient - 50 - 74% Assistive device: Walker-rolling   Walk 50 feet activity   Assist    Assist level: Moderate Assistance - Patient - 50 - 74% Assistive device: Walker-rolling    Walk 150 feet activity   Assist Walk 150 feet activity did not occur: Safety/medical concerns  Assist level: Moderate Assistance - Patient - 50 - 74% Assistive device: Walker-rolling    Walk 10 feet on uneven surface  activity   Assist     Assist level: Maximal Assistance - Patient 25 - 49% Assistive device: Hand held assist, Other (comment) (handrail)   Wheelchair     Assist Is the patient using a  wheelchair?: Yes Type of Wheelchair: Manual    Wheelchair assist level: Supervision/Verbal cueing Max wheelchair distance: 314 feet    Wheelchair 50 feet with 2 turns activity    Assist        Assist Level: Supervision/Verbal cueing   Wheelchair 150 feet activity     Assist      Assist Level: Supervision/Verbal cueing   Blood pressure (!) 157/90, pulse 77, temperature 98.3 F (36.8 C), temperature source Oral, resp. rate 18, height 5\' 9"  (1.753 m), weight 82.9 kg, SpO2 100 %.  Medical Problem List and Plan: 1. 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  Antithrombotics: -DVT/anticoagulation:  Pharmaceutical: Lovenox             -antiplatelet therapy: DAPT X 3 months followed by ASA 325 mg daily 3. Pain Management:  Tylenol prn.  4. Mood/Behavior/Sleep: LCSW to follow for evaluation and support.              --Trazodone prn for insomnia             -antipsychotic agents: N/A 5. Neuropsych/cognition: This patient is ?capable of making decisions on his own behalf. 6. Skin/Wound Care:  Routine pressure relief measures.  7. Fluids/Electrolytes/Nutrition: Monitor I/O. Check CMET in am. 8. HTN: Monitor BP TID--avoid hypotension with Right M2 occlusion.             --continue Cozaar, Coreg and HCTZ  --Will decrease HCTZ to 12.5mg  due to AKI  -7/31 will increase HCTZ back to 25mg  daily  -8/11 last BP 106/73, he is intermittently high but will hold off on increasing antihypertensives for now to avoid hypotension, continue to monitor Vitals:   09/27/21 0806 09/27/21 0806  BP: (!) 157/90 (!) 157/90  Pulse: 77 77  Resp:    Temp:    SpO2:      9. T2DM: Hgb A1c- 8.1 (down from 12). Will continue to monitor BS  ac/hs             continue Glipizide and metformin  -Well controlled, continue to monitor for now  -8/1 DC glipizide CBG (last 3)  Recent Labs    09/26/21 1640 09/26/21 2128 09/27/21 0621  GLUCAP 124* 120* 129*  Controlled    10. AKI  -Will decrease HCTZ to 12.5mg   -NS 50 ml/hr startedd 7/28, Cr reviewed and has normalized  -Will  DC fluids 7/31, recheck labs on Wednesday 8/2  -Scr 0.99 8/7, stable, follow with weekly labs 11. HLD  -continue statin, heart healthy diet 12. Urinary incontinence  8/5- will order Timed voiding- q2 hours since cannot tell when going/needs to go or has any control- and cannot feel when wet  8/9 continence improving, continue timed voids  8/11 He had 1 dose oxybutynin yesterday, seem likely he had benefit and had continent episode, will increase oxybutynin to 5mg  TID, monitor PVR 13. Constipation  8/5- LBM 4-5+ days ago- d/w pt- insisted needs to take bowel meds or could require disimpaction- pt agreed- will lgive Sorbitol and soap suds enema if no BM  8/6- Had at least 4 BM's- got cleaned out- didn't need enema.   -Continue senokot, Schedule miralax  -LBM today, improved 14. Anemia, mild  -HGB a little improved to 11.8, stable, no signs of bleeding  -Will check occult stool- pending     LOS:  16 days A FACE TO FACE EVALUATION WAS PERFORMED  09/27/2021, 8:27 AM

## 2021-09-27 NOTE — Progress Notes (Signed)
Physical Therapy Session Note  Patient Details  Name: Shawn Austin MRN: [id number] Date of Birth: 07/19/1965  Today's Date: 09/27/2021 PT Individual Time: 1630-1740 PT Individual Time Calculation (min): 70 min   Short Term Goals: Week 3:  PT Short Term Goal 1 (Week 3): Pt will perform stand pivot transfer with CGA PT Short Term Goal 2 (Week 3): Pt will ambulate 25 ft with min A  Skilled Therapeutic Interventions/Progress Updates:  Pt received supine in bed and agreeable to PT. Supine>sit transfer without assist through long sitting.   Squat pivot transfer to WC with mod Assist due to poor BLE positioning on pushers syndrome to the R.   Sit<>stand transfers throughout session with RW and min-mod assist throughout session with cues for midline to improve safety as well as proper BLE positioning to prevent R LOB. .   Gait training with RW mod assist from PT due to heavy lateral lean/pushing to the R. Visual feedback to straddle 1x4 board on floor to maintain wide BOS and prevent adduction on the RLE. Improvingstep width in gait throughout session. Performed 50ft x 6 throughout session.   Dynamic standing balance to perform lateral reach to improve weight shift to the Left to place and remove clothes pins from basketball net 2x6 with min assist from PT and cues for improved pelvic and trunk translation   Nustep reciprocal movement training x 8 minutes with cues for full ROM into extension on the LLE with RLE in flexion .   Pt reports need to urinate. Transported to bathroom in WC. Urination standing at toilet with 1 UE supported on rail. Able to void. .   Stand pivot transfers to and from WC with BUE support on arm rest and bed rails and min assist.  Sit>supine completed with supervision assist with cues for positioning in bed, and left supine in bed with call bell in reach and all needs met.       Therapy Documentation Precautions:  Precautions Precautions: Fall Precaution  Comments: R gaze preference, R lateral lean in sitting/standing Restrictions Weight Bearing Restrictions: No     Vital Signs: Therapy Vitals Temp: 97.7 F (36.5 C) Temp Source: Oral Pulse Rate: 80 Resp: 17 BP: 130/89 Patient Position (if appropriate): Sitting Oxygen Therapy SpO2: 100 % Pain: denies    Therapy/Group: Individual Therapy   E  09/27/2021, 6:35 PM  

## 2021-09-27 NOTE — Plan of Care (Signed)
  Problem: Consults Goal: RH STROKE PATIENT EDUCATION Description: See Patient Education module for education specifics  Outcome: Progressing   Problem: RH BOWEL ELIMINATION Goal: RH STG MANAGE BOWEL WITH ASSISTANCE Description: STG Manage Bowel with  mod I Assistance. Outcome: Progressing Goal: RH STG MANAGE BOWEL W/MEDICATION W/ASSISTANCE Description: STG Manage Bowel with Medication with mod I Assistance. Outcome: Progressing   Problem: RH BLADDER ELIMINATION Goal: RH STG MANAGE BLADDER WITH ASSISTANCE Description: STG Manage Bladder With mod I  and toileting Assistance Outcome: Progressing   Problem: RH SAFETY Goal: RH STG ADHERE TO SAFETY PRECAUTIONS W/ASSISTANCE/DEVICE Description: STG Adhere to Safety Precautions With cues Assistance/Device. Outcome: Progressing   Problem: RH KNOWLEDGE DEFICIT Goal: RH STG INCREASE KNOWLEDGE OF DIABETES Description: Patient and significant other will be able to manage DM with medications and dietary modifications using handouts and educational resources independently  Outcome: Progressing Goal: RH STG INCREASE KNOWLEDGE OF HYPERTENSION Description: Patient and significant other will be able to manage HTN with medications and dietary modifications using handouts and educational resources independently  Outcome: Progressing Goal: RH STG INCREASE KNOWLEGDE OF HYPERLIPIDEMIA Description: Patient and significant other will be able to manage HLD with medications and dietary modifications using handouts and educational resources independently  Outcome: Progressing Goal: RH STG INCREASE KNOWLEDGE OF STROKE PROPHYLAXIS Description: Patient and significant other will be able to manage secondary risks with medications and dietary modifications using handouts and educational resources independently  Outcome: Progressing   Problem: RH Vision Goal: RH LTG Vision (Specify) Outcome: Progressing   

## 2021-09-28 LAB — GLUCOSE, CAPILLARY
Glucose-Capillary: 124 mg/dL — ABNORMAL HIGH (ref 70–99)
Glucose-Capillary: 147 mg/dL — ABNORMAL HIGH (ref 70–99)
Glucose-Capillary: 136 mg/dL — ABNORMAL HIGH (ref 70–99)
Glucose-Capillary: 129 mg/dL — ABNORMAL HIGH (ref 70–99)

## 2021-09-28 NOTE — Progress Notes (Signed)
Physical Therapy Session Note  Patient Details  Name: Shawn Austin MRN: [id number] Date of Birth: 05/14/1965  Today's Date: 09/28/2021 PT Individual Time: 1047-1200 PT Individual Time Calculation (min): 73 min   Short Term Goals: Week 1:  PT Short Term Goal 1 (Week 1): Pt will perform sit to stand with LRAD and min A PT Short Term Goal 1 - Progress (Week 1): Met PT Short Term Goal 2 (Week 1): Pt will perform stand pivot transfer with LRAD and mod A PT Short Term Goal 2 - Progress (Week 1): Met PT Short Term Goal 3 (Week 1): Pt will ambulate 50 ft with LRAD and mod A PT Short Term Goal 3 - Progress (Week 1): Progressing toward goal Week 2:  PT Short Term Goal 1 (Week 2): Pt will perform sit to stand with LRAD and CGA PT Short Term Goal 1 - Progress (Week 2): Met PT Short Term Goal 2 (Week 2): Pt will perform stand pivot transfer with LRAD and min A PT Short Term Goal 2 - Progress (Week 2): Met PT Short Term Goal 3 (Week 2): Pt will ambulate 25 feet with mod A and LRAD PT Short Term Goal 3 - Progress (Week 2): Met PT Short Term Goal 5 (Week 2): Pt will perform sit to stand with SBA  Skilled Therapeutic Interventions/Progress Updates:      Therapy Documentation Precautions:  Precautions Precautions: Fall Precaution Comments: R gaze preference, R lateral lean in sitting/standing Restrictions Weight Bearing Restrictions: No  Pt received seated in w/c at bedside and declines pain. Pt propelled w/c ~150 ft and transported remaining distance to main gym. Pt participated in gait training of 50 feet x 3 with RW and min-mod A. Pt requires tactile cueing with left weight shift. Pt reported need to urinate and transported to bathroom. Pt requires CGA with sit to stand and while voiding pt also had bowel movement. Pt required total A for peri-care. Pt performed right LE step ups on to 6 inch step with 2 HR's to practice left weight shift and performed 2 x 10. Pt required min A with verbal  cueing for stairs x 4 with 2 HR's. Pt requires CGA with dynamic standing balance activity in parallel bars in which pt utilized R UE to reach for horse shoe and place to left side to encourage left weight shifting to improve coordination and postural control. Pt transported to room and left seated in w/c at bedside with chair alarm on and nurse tech present.    Therapy/Group: Individual Therapy  Verl Dicker Verl Dicker PT, DPT  09/28/2021, 7:44 AM

## 2021-09-29 LAB — CBC
HCT: 35.1 % — ABNORMAL LOW (ref 39.0–52.0)
Hemoglobin: 12 g/dL — ABNORMAL LOW (ref 13.0–17.0)
MCH: 28 pg (ref 26.0–34.0)
MCHC: 34.2 g/dL (ref 30.0–36.0)
MCV: 82 fL (ref 80.0–100.0)
Platelets: 523 10*3/uL — ABNORMAL HIGH (ref 150–400)
RBC: 4.28 MIL/uL (ref 4.22–5.81)
RDW: 12.3 % (ref 11.5–15.5)
WBC: 6.2 10*3/uL (ref 4.0–10.5)
nRBC: 0 % (ref 0.0–0.2)

## 2021-09-29 LAB — BASIC METABOLIC PANEL
Anion gap: 8 (ref 5–15)
BUN: 16 mg/dL (ref 6–20)
CO2: 22 mmol/L (ref 22–32)
Calcium: 8.7 mg/dL — ABNORMAL LOW (ref 8.9–10.3)
Chloride: 102 mmol/L (ref 98–111)
Creatinine, Ser: 1.09 mg/dL (ref 0.61–1.24)
GFR, Estimated: >60 mL/min (ref >60)
Glucose, Bld: 132 mg/dL — ABNORMAL HIGH (ref 70–99)
Potassium: 4 mmol/L (ref 3.5–5.1)
Sodium: 132 mmol/L — ABNORMAL LOW (ref 135–145)

## 2021-09-29 LAB — GLUCOSE, CAPILLARY
Glucose-Capillary: 104 mg/dL — ABNORMAL HIGH (ref 70–99)
Glucose-Capillary: 131 mg/dL — ABNORMAL HIGH (ref 70–99)
Glucose-Capillary: 110 mg/dL — ABNORMAL HIGH (ref 70–99)
Glucose-Capillary: 115 mg/dL — ABNORMAL HIGH (ref 70–99)

## 2021-09-29 MED ORDER — SENNOSIDES-DOCUSATE SODIUM 8.6-50 MG PO TABS: 1.0000 | ORAL_TABLET | Freq: Every evening | ORAL | Status: DC | PRN

## 2021-09-29 MED ORDER — POLYETHYLENE GLYCOL 3350 17 G PO PACK
17.0000 g | PACK | Freq: Every day | ORAL | Status: DC | PRN
  Administered 2021-10-01: 17 g via ORAL
  Filled 2021-09-29: qty 1

## 2021-09-29 MED ORDER — CLOPIDOGREL BISULFATE 75 MG PO TABS
75.0000 mg | ORAL_TABLET | Freq: Every day | ORAL | Status: DC
  Administered 2021-09-29 – 2021-10-10 (×12): 75 mg via ORAL
  Filled 2021-09-29 (×12): qty 1

## 2021-09-29 NOTE — Progress Notes (Signed)
Speech Language Pathology Daily Session Note  Patient Details  Name: Shawn Austin MRN: [id number] Date of Birth: Jan 17, 1966  Today's Date: 09/29/2021 SLP Individual Time: 5701-7793 SLP Individual Time Calculation (min): 55 min  Short Term Goals: Week 3: SLP Short Term Goal 1 (Week 3): STG=LTG due to ELOS  Skilled Therapeutic Interventions: Skilled ST treatment focused on cognitive goals. Pt received upright in recliner on arrival and agreeable to ST intervention. Pt appeared uninterested in therapist by keeping eyes closed for majority of session, consistent with most previous encounters. SLP read patient a story and pt identified problematic scenarios with mod A verbal cues and generated appropriate solutions with max A verbal cues and repetition. At one point pt expressed "I don't understand why we're doing this." SLP provided rationale while elaborating on cognitive/communication deficits s/p CVA. Pt continues to exhibit impaired safety awareness and insight into deficits. Pt went on to explain he feel he is prepared to drive when discharged from the hospital and having a doctor clear him to drive would simply be the doctor's opinion and unnecessary. SLP inquired about who plans to provide pt with support at discharge. Pt stated he did not know. SLP provided further stroke education using acronym "be fast." Pt recalled 2/6 details following education with no cues, progressing to 5/6 with min A semantic cues. Patient was left in recliner with alarm activated and immediate needs within reach at end of session. Continue per current plan of care.      Pain None/denied   Therapy/Group: Individual Therapy  Tamala Ser 09/29/2021, 3:07 PM

## 2021-09-29 NOTE — Discharge Instructions (Addendum)
Inpatient Rehab Discharge Instructions  Shawn Austin Discharge date and time:  10/10/21  Activities/Precautions/ Functional Status: Activity: no lifting, driving, or strenuous exercise till cleared by MD Diet: cardiac diet and diabetic diet Wound Care: none needed   Functional status:  ___ No restrictions     ___ Walk up steps independently _X__ 24/7 supervision/assistance   ___ Walk up steps with assistance ___ Intermittent supervision/assistance  ___ Bathe/dress independently ___ Walk with walker     _X__ Bathe/dress with assistance ___ Walk Independently    ___ Shower independently ___ Walk with assistance    ___ Shower with assistance _X__ No alcohol     ___ Return to work/school ________   Special Instructions:  Needs to be toileted every 3-4 hours while awake, before bed and first thing in the morning to prevent  2.  Recommend in and out cath once a day before bedtime to decompress the bladder to avoid urinary tract infection and make sure that you are not retaining urine.    COMMUNITY REFERRALS UPON DISCHARGE:    Doesn't qualify for charity care would be private pay of 67% of cost of visit. Home exercise program given to patient   Medical Equipment/Items Ordered: WHEELCHAIR, 3 IN 1 , ROLLING WALKER                                                 Agency/Supplier:[facility]   [phone]  MATCH FOR PRESCRIPTION ASSISTANCE AND HAS [facility] FOLLOW UP APPOINTMENT Do not cancel you appointment with [facility] and [facility]. Contact them if you need assistance with transportation.    STROKE/TIA DISCHARGE INSTRUCTIONS SMOKING Cigarette smoking nearly doubles your risk of having a stroke & is the single most alterable risk factor  If you smoke or have smoked in the last 12 months, you are advised to quit smoking for your health. Most of the excess cardiovascular risk related to smoking disappears within a year of stopping. Ask you doctor about  anti-smoking medications [place] Quit Line: 1-800-QUIT NOW Free Smoking Cessation Classes [phone]  CHOLESTEROL Know your levels; limit fat & cholesterol in your diet  Lipid Panel     Component Value Date/Time   CHOL 232 (H) 09/02/2021 0427   TRIG 65 09/02/2021 0427   HDL 50 09/02/2021 0427   CHOLHDL 4.6 09/02/2021 0427   VLDL 13 09/02/2021 0427   LDLCALC 169 (H) 09/02/2021 0427     Many patients benefit from treatment even if their cholesterol is at goal. Goal: Total Cholesterol (CHOL) less than 160 Goal:  Triglycerides (TRIG) less than 150 Goal:  HDL greater than 40 Goal:  LDL (LDLCALC) less than 100   BLOOD PRESSURE American Stroke Association blood pressure target is less that 120/80 mm/Hg  Your discharge blood pressure is:  BP: (!) 150/90 Monitor your blood pressure Limit your salt and alcohol intake Many individuals will require more than one medication for high blood pressure  DIABETES (A1c is a blood sugar average for last 3 months) Goal HGBA1c is under 7% (HBGA1c is blood sugar average for last 3 months)  Diabetes:     Lab Results  Component Value Date   HGBA1C 8.1 (H) 09/02/2021    Your HGBA1c can be lowered with medications, healthy diet, and exercise. Check your blood sugar as directed by your physician Call your physician  if you experience unexplained or low blood sugars.  PHYSICAL ACTIVITY/REHABILITATION Goal is 30 minutes at least 4 days per week  Activity: Increase activity slowly, and No driving, Therapies: see above Return to work: N/a Activity decreases your risk of heart attack and stroke and makes your heart stronger.  It helps control your weight and blood pressure; helps you relax and can improve your mood. Participate in a regular exercise program. Talk with your doctor about the best form of exercise for you (dancing, walking, swimming, cycling).  DIET/WEIGHT Goal is to maintain a healthy weight  Your discharge diet is:  Diet Order              DIET SOFT Room service appropriate? Yes; Fluid consistency: Thin  Diet effective now                   liquids Your height is:  Height: 5\' 9"  (175.3 cm) Your current weight is: Weight: 82.9 kg Your Body Mass Index (BMI) is:  BMI (Calculated): 26.98 Following the type of diet specifically designed for you will help prevent another stroke. Your goal weight is 169 lbs Your goal Body Mass Index (BMI) is 19-24. Healthy food habits can help reduce 3 risk factors for stroke:  High cholesterol, hypertension, and excess weight.  RESOURCES Stroke/Support Group:  Call [phone]   STROKE EDUCATION PROVIDED/REVIEWED AND GIVEN TO PATIENT Stroke warning signs and symptoms How to activate emergency medical system (call 911). Medications prescribed at discharge. Need for follow-up after discharge. Personal risk factors for stroke. Pneumonia vaccine given:  Flu vaccine given:  My questions have been answered, the writing is legible, and I understand these instructions.  I will adhere to these goals & educational materials that have been provided to me after my discharge from the hospital.      My questions have been answered and I understand these instructions. I will adhere to these goals and the provided educational materials after my discharge from the hospital.  Patient/Caregiver Signature _______________________________ Date __________  Clinician Signature _______________________________________ Date __________  Please bring this form and your medication list with you to all your follow-up doctor's appointments.

## 2021-09-29 NOTE — Progress Notes (Signed)
PROGRESS NOTE   Subjective/Complaints:  Reports loose Bms. No additional concerns.   ROS: No CP, SOB, Abdominal pain, N/V/C, HA   Objective:   No results found. No results for input(s): "WBC", "HGB", "HCT", "PLT" in the last 72 hours.   No results for input(s): "NA", "K", "CL", "CO2", "GLUCOSE", "BUN", "CREATININE", "CALCIUM" in the last 72 hours.    Intake/Output Summary (Last 24 hours) at 09/29/2021 0856 Last data filed at 09/29/2021 0832 Gross per 24 hour  Intake 827 ml  Output --  Net 827 ml         Physical Exam: Vital Signs Blood pressure 139/81, pulse 91, temperature (!) 97.3 F (36.3 C), resp. rate 18, height 5\' 9"  (1.753 m), weight 82.9 kg, SpO2 100 %.   General: No acute distress Mood and affect are appropriate Heart: Regular rate and rhythm no rubs murmurs or extra sounds Lungs: Clear to auscultation, breathing unlabored, no rales or wheezes, good air movement Abdomen: Positive bowel sounds, soft nontender to palpation, nondistended Extremities: No clubbing, cyanosis, or edema Skin: No evidence of breakdown, no evidence of rash   Psych: Pt's affect is appropriate. Pt is cooperative  Neuro:Speech clear. Keeps eyes turned to the right and able to move to left with some effort. Left lateral rectus weakness. Left central 7. Demonstrates almost a left inattention-improved. Follows commands. Altered Left finger to nose , decreased fine motor movement LLE. Strength 5/5 RUE and RLE and grossly 4 to 4+/5 LUE and LLE. Senses pain in all 4'. No resting tone. Musculoskeletal: No joint swelling or tenderness noted, ROM normal  Assessment/Plan: 1. Functional deficits which require 3+ hours per day of interdisciplinary therapy in a comprehensive inpatient rehab setting. Physiatrist is providing close team supervision and 24 hour management of active medical problems listed below. Physiatrist and rehab team  continue to assess barriers to discharge/monitor patient progress toward functional and medical goals  Care Tool:  Bathing    Body parts bathed by patient: Right arm, Chest, Abdomen, Left arm, Front perineal area, Right upper leg, Left upper leg, Face, Right lower leg, Left lower leg, Buttocks   Body parts bathed by helper: Buttocks     Bathing assist Assist Level: Minimal Assistance - Patient > 75%     Upper Body Dressing/Undressing Upper body dressing   What is the patient wearing?: Pull over shirt    Upper body assist Assist Level: Supervision/Verbal cueing    Lower Body Dressing/Undressing Lower body dressing      What is the patient wearing?: Incontinence brief, Pants     Lower body assist Assist for lower body dressing: Minimal Assistance - Patient > 75%     Toileting Toileting    Toileting assist Assist for toileting: Minimal Assistance - Patient > 75%     Transfers Chair/bed transfer  Transfers assist     Chair/bed transfer assist level: Minimal Assistance - Patient > 75%     Locomotion Ambulation   Ambulation assist      Assist level: Moderate Assistance - Patient 50 - 74% Assistive device: Walker-rolling Max distance: 300 feet   Walk 10 feet activity   Assist     Assist level:  Moderate Assistance - Patient - 50 - 74% Assistive device: Walker-rolling   Walk 50 feet activity   Assist    Assist level: Moderate Assistance - Patient - 50 - 74% Assistive device: Walker-rolling    Walk 150 feet activity   Assist Walk 150 feet activity did not occur: Safety/medical concerns  Assist level: Moderate Assistance - Patient - 50 - 74% Assistive device: Walker-rolling    Walk 10 feet on uneven surface  activity   Assist     Assist level: Maximal Assistance - Patient 25 - 49% Assistive device: Hand held assist, Other (comment) (handrail)   Wheelchair     Assist Is the patient using a wheelchair?: Yes Type of Wheelchair:  Manual    Wheelchair assist level: Supervision/Verbal cueing Max wheelchair distance: 314 feet    Wheelchair 50 feet with 2 turns activity    Assist        Assist Level: Supervision/Verbal cueing   Wheelchair 150 feet activity     Assist      Assist Level: Supervision/Verbal cueing   Blood pressure 139/81, pulse 91, temperature (!) 97.3 F (36.3 C), resp. rate 18, height 5\' 9"  (1.753 m), weight 82.9 kg, SpO2 100 %.  Medical Problem List and Plan: 1. 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  Antithrombotics: -DVT/anticoagulation:  Pharmaceutical: Lovenox             -antiplatelet therapy: DAPT X 3 months followed by ASA 325 mg daily 3. Pain Management:  Tylenol prn.  4. Mood/Behavior/Sleep: LCSW to follow for evaluation and support.              --Trazodone prn for insomnia             -antipsychotic agents: N/A 5. Neuropsych/cognition: This patient is ?capable of making decisions on his own behalf. 6. Skin/Wound Care:  Routine pressure relief measures.  7. Fluids/Electrolytes/Nutrition: Monitor I/O. Check CMET in am. 8. HTN: Monitor BP TID--avoid hypotension with Right M2 occlusion.             --continue Cozaar, Coreg and HCTZ  --Will decrease HCTZ to 12.5mg  due to AKI  -7/31 will increase HCTZ back to 25mg  daily  -8/11 last BP 106/73, he is intermittently high but will hold off on increasing antihypertensives for now to avoid hypotension, continue to monitor  -8/14 well controlled, monitor Vitals:   09/28/21 2012 09/29/21 0449  BP: 137/88 139/81  Pulse: 84 91  Resp: 17 18  Temp: 98.7 F (37.1 C) (!) 97.3 F (36.3 C)  SpO2: 100% 100%    9. T2DM: Hgb A1c- 8.1 (down from 12). Will continue to  monitor BS ac/hs             continue Glipizide and metformin  -Well controlled, continue to monitor for now  -8/1 DC glipizide CBG (last 3)  Recent Labs    09/28/21 1650 09/28/21 2109 09/29/21 0613  GLUCAP 136* 129* 104*   -Well controlled, monitor   10. AKI  -Will decrease HCTZ to 12.5mg   -NS 50 ml/hr startedd 7/28, Cr reviewed and has normalized  -Will DC fluids 7/31, recheck  labs on Wednesday 8/2  -Scr 0.99 8/7, stable, follow with weekly labs 11. HLD  -continue statin, heart healthy diet 12. Urinary incontinence  8/5- will order Timed voiding- q2 hours since cannot tell when going/needs to go or has any control- and cannot feel when wet  8/9 continence improving, continue timed voids  8/11 He had 1 dose oxybutynin yesterday, seem likely he had benefit and had continent episode, will increase oxybutynin to 5mg  TID, monitor PVR 13. Constipation  8/5- LBM 4-5+ days ago- d/w pt- insisted needs to take bowel meds or could require disimpaction- pt agreed- will lgive Sorbitol and soap suds enema if no BM  8/6- Had at least 4 BM's- got cleaned out- didn't need enema.   -Continue senokot, Schedule miralax  -Change Miralax  and senokot to PRN 14. Anemia, mild  -HGB a little improved to 11.8, stable, no signs of bleeding  -Will check occult stool- pending     LOS:  18 days A FACE TO FACE EVALUATION WAS PERFORMED  Jennye Boroughs 09/29/2021, 8:56 AM

## 2021-09-29 NOTE — Progress Notes (Signed)
Occupational Therapy Session Note  Patient Details  Name: Shawn Austin MRN: [id number] Date of Birth: 21-May-1965  Today's Date: 09/29/2021 OT Individual Time: 1007-1101 OT Individual Time Calculation (min): 54 min    Short Term Goals: Week 3:  OT Short Term Goal 1 (Week 3): Pt will complete toilet transfers with CGA OT Short Term Goal 2 (Week 3): Pt will require no more than min cueing to maintain midline orientation during ambulatory transfer to the bathroom OT Short Term Goal 3 (Week 3): Pt will complete LB ADLs with (S) overall  Skilled Therapeutic Interventions/Progress Updates:    Pt received in the recliner with no c/o pain. Severe R gaze preference, however upon donning R gaze occlusion glasses his attention to midline significantly improved. These were worn for the remainder of the session. He completed a sit > stand and 3 step transfer to the w/c using the RW with only CGA. He required no cueing for midline awareness, self correcting R lean. He was taken to the therapy gym via w/c. He completed ambulatory transfer to the mat with min A using the RW. He sat EOM and completed dynamic catching/throwing activity with a ball, with majority of targets being in the L visual field to promote L scanning and bimanual coordination. He did excellent with this activity and with L scanning- significant improvement! He then completed core twists with an unweighted ball to promote increase trunk control as well as bimanual coordination. 3x 20 repetitions. Cueing to ensure visual attention to R/L equally. He then completed functional stepping activity, completing 25 ft of functional mobility with the RW and stepping over 2 hockey sticks to simulate home threshold navigation. He was able to complete the activity with min A overall, requiring cueing for RW management (holding too close), midline awareness, and visual scanning. Upon termination of activity, approaching mat to the R, he had large full LOB to  the R, requiring OT intervention to avoid fall. He took a seated rest break and then attempted activity again. He requested to return to his room for urgent urine void. He was able to remain continent, and transferred into standing over the toilet with min A. Mod A for toileting tasks, including peri hygiene as pt had small incontinent BM. Pt was left sitting up in the recliner with all needs met, chair alarm set, and call bell within reach.    Therapy Documentation Precautions:  Precautions Precautions: Fall Precaution Comments: R gaze preference, R lateral lean in sitting/standing Restrictions Weight Bearing Restrictions: No   Therapy/Group: Individual Therapy  [name] 09/29/2021, 6:22 AM

## 2021-09-29 NOTE — Progress Notes (Signed)
Physical Therapy Session Note  Patient Details  Name: Shawn Austin MRN: [id number] Date of Birth: 1965-06-14  Today's Date: 09/29/2021 PT Individual Time: 0258-5277 PT Individual Time Calculation (min): 58 min & 38 min  Short Term Goals: Week 1:  PT Short Term Goal 1 (Week 1): Pt will perform sit to stand with LRAD and min A PT Short Term Goal 1 - Progress (Week 1): Met PT Short Term Goal 2 (Week 1): Pt will perform stand pivot transfer with LRAD and mod A PT Short Term Goal 2 - Progress (Week 1): Met PT Short Term Goal 3 (Week 1): Pt will ambulate 50 ft with LRAD and mod A PT Short Term Goal 3 - Progress (Week 1): Progressing toward goal Week 2:  PT Short Term Goal 1 (Week 2): Pt will perform sit to stand with LRAD and CGA PT Short Term Goal 1 - Progress (Week 2): Met PT Short Term Goal 2 (Week 2): Pt will perform stand pivot transfer with LRAD and min A PT Short Term Goal 2 - Progress (Week 2): Met PT Short Term Goal 3 (Week 2): Pt will ambulate 25 feet with mod A and LRAD PT Short Term Goal 3 - Progress (Week 2): Met PT Short Term Goal 5 (Week 2): Pt will perform sit to stand with SBA  Skilled Therapeutic Interventions/Progress Updates:      Therapy Documentation Precautions:  Precautions Precautions: Fall Precaution Comments: R gaze preference, R lateral lean in sitting/standing Restrictions Weight Bearing Restrictions: No  Treatment Session 1:  Pt received seated in recliner at bedside and declines pain. Pt agreeable to PT session and requires CGA for sit to stand and mod A for ambulation x 10 ft to toliet and pt able to void. Pt propelled w/c ~160 feet to ortho gym and required mod A for stand pivot with RW to mat.  PT session with emphasis on dynamic standing balance activities. In standing pt utilized bilateral UE to mobilize 1.1 # ball in D2 pattern to facilitate left lateral weight shift. Pt performed step up on to 3 inch platform with right LE and RW to facilitate  left weight shift with dynamic activities ( 2 x 8). Pt performed sit to stand holding 1.1# ball  (2 x 8) to facilitate anterior weight shift as pt presents with posterior lean with transfers. Pt propelled w/c to room with supervision for safety and obstacle negotition. Pt left seated in recliner at bedside with all needs in reach and chair alarm on.   Treatment Session 2:  Pt received seated in recliner at bedside and without complaints of pain throughout session. PT session with emphasis on gait training to increase independence with mobility to prepare for discharge. Pt requires mod A with gait of 65 feet x 4 (260 feet) with RW. PT provided tactile cueing at trunk/pelvis to facilitate left weight shift. Pt able to self-correct right lean with cueing. Pt demonstrates improvement with turns during gait with RW and requires min A for negotiation and verbal cues for sequencing. Pt reported need to void and performed sit to stand from w/c to toilet and required CGA with grab bars. Pt required min A for squat pivot transfer w/c to recliner and left seated at bedside with chair alarm on and all needs within reach.    Therapy/Group: Individual Therapy  Verl Dicker Verl Dicker PT, DPT  09/29/2021, 7:45 AM

## 2021-09-30 DIAGNOSIS — R339 Retention of urine, unspecified: Secondary | ICD-10-CM

## 2021-09-30 LAB — GLUCOSE, CAPILLARY
Glucose-Capillary: 98 mg/dL (ref 70–99)
Glucose-Capillary: 135 mg/dL — ABNORMAL HIGH (ref 70–99)
Glucose-Capillary: 139 mg/dL — ABNORMAL HIGH (ref 70–99)
Glucose-Capillary: 115 mg/dL — ABNORMAL HIGH (ref 70–99)

## 2021-09-30 MED ORDER — TAMSULOSIN HCL 0.4 MG PO CAPS
0.4000 mg | ORAL_CAPSULE | Freq: Every day | ORAL | Status: DC
  Administered 2021-09-30 – 2021-10-06 (×7): 0.4 mg via ORAL
  Filled 2021-09-30 (×7): qty 1

## 2021-09-30 MED ORDER — LIDOCAINE HCL URETHRAL/MUCOSAL 2 % EX GEL
1.0000 | CUTANEOUS | Status: DC | PRN
  Administered 2021-09-30 – 2021-10-09 (×6): 1 via URETHRAL
  Filled 2021-09-30 (×11): qty 6

## 2021-09-30 MED ORDER — HYDROCHLOROTHIAZIDE 12.5 MG PO TABS
12.5000 mg | ORAL_TABLET | Freq: Every day | ORAL | Status: DC
  Administered 2021-10-01 – 2021-10-04 (×4): 12.5 mg via ORAL
  Filled 2021-09-30 (×4): qty 1

## 2021-09-30 NOTE — Plan of Care (Signed)
  Problem: RH Problem Solving Goal: LTG Patient will demonstrate problem solving for (SLP) Description: LTG:  Patient will demonstrate problem solving for basic/complex daily situations with cues  (SLP) Flowsheets (Taken 09/30/2021 0757) LTG Patient will demonstrate problem solving for: Moderate Assistance - Patient 50 - 74% Note: Goal downgraded due to slower than anticipated progress   Problem: RH Memory Goal: LTG Patient will demonstrate ability for day to day (SLP) Description: LTG:   Patient will demonstrate ability for day to day recall/carryover during cognitive/linguistic activities with assist  (SLP) Flowsheets (Taken 09/30/2021 0757) LTG: Patient will demonstrate ability for day to day recall/carryover during cognitive/linguistic activities with assist (SLP): Moderate Assistance - Patient 50 - 74% Note: Goal downgraded due to slower than anticipated progress

## 2021-09-30 NOTE — Progress Notes (Signed)
Physical Therapy Session Note  Patient Details  Name: Shawn Austin MRN: [id number] Date of Birth: 06-16-1965  Today's Date: 09/30/2021 PT Individual Time: 1002-1100 PT Individual Time Calculation (min): 58 min & 28 min   Short Term Goals: Week 1:  PT Short Term Goal 1 (Week 1): Pt will perform sit to stand with LRAD and min A PT Short Term Goal 1 - Progress (Week 1): Met PT Short Term Goal 2 (Week 1): Pt will perform stand pivot transfer with LRAD and mod A PT Short Term Goal 2 - Progress (Week 1): Met PT Short Term Goal 3 (Week 1): Pt will ambulate 50 ft with LRAD and mod A PT Short Term Goal 3 - Progress (Week 1): Progressing toward goal Week 2:  PT Short Term Goal 1 (Week 2): Pt will perform sit to stand with LRAD and CGA PT Short Term Goal 1 - Progress (Week 2): Met PT Short Term Goal 2 (Week 2): Pt will perform stand pivot transfer with LRAD and min A PT Short Term Goal 2 - Progress (Week 2): Met PT Short Term Goal 3 (Week 2): Pt will ambulate 25 feet with mod A and LRAD PT Short Term Goal 3 - Progress (Week 2): Met PT Short Term Goal 5 (Week 2): Pt will perform sit to stand with SBA  Skilled Therapeutic Interventions/Progress Updates:      Therapy Documentation Precautions:  Precautions Precautions: Fall Precaution Comments: R gaze preference, R lateral lean in sitting/standing Restrictions Weight Bearing Restrictions: No  Treatment Session 1:  Pt received seated in w/c at bedside with nurse present. Nurse reports pt had emesis episode 15 minutes prior. Pt reported feeling better and declined pain. Pt requires CGA with sit to stand and mod A for ambulation ~10 ft for toileting. Observed blood in urine and nurse informed and reached out to medical team. PT session with emphasis on equal weight shifting in standing and with gait. PT instructed pt to use left shoulder to tap therapist shoulder to encourage lateral weight shift to left in standing and with gait of 5 ft x 2  min A. Nurse returned to room with medications and assessed vitals. BP was 101/74 (84), pulse was 87 and SPO2 was 100. Pt reported lightheadedness and states it started prior to arrival of PT. Pt required mod A for stand pivot to bed and supervision with sit to supine and BP recorded as 130/76 (92). Pt left semi-reclined in bed with bed alarm on and all needs within reach.    Treatment Session 2:  Pt received semi-reclined in bed and declines pain. Pt requires supervision with bed mobility and CGA with sit to stand and min A with stand step turn transfer with RW to w/c. Pt reported need to void and transported in w/c to toilet. Pt transported to dayroom in w/c for time management and energy conservation. PT session with emphasis on high intensity gait training and pt ambulated total of 180 feet in one bout with RW. With gait, pt primarily needed min A and required intermittent mod A to regain balance and with turning. Pt provided with visual and verbal cueing to foster lateral weight shift to the left with gait. PT instructed patient to mobilize L shoulder towards PT's shoulder with left heel strike. PT also provided verbal cue "left" every step to facilitate weight shift. Pt returned to room and required supervision with bed mobility and left semi-reclined in bed with bed alarm on and all needs within reach.  Therapy/Group: Individual Therapy  Verl Dicker Verl Dicker PT, DPT  09/30/2021, 7:56 AM

## 2021-09-30 NOTE — Progress Notes (Signed)
PROGRESS NOTE   Subjective/Complaints:  Had urinary retention last night requiring IC.  No additional concerns.  LBM today  ROS: No CP, SOB, Abdominal pain, N/V/C, HA,vision changes   Objective:   No results found. Recent Labs    09/29/21 0851  WBC 6.2  HGB 12.0*  HCT 35.1*  PLT 523*     Recent Labs    09/29/21 0851  NA 132*  K 4.0  CL 102  CO2 22  GLUCOSE 132*  BUN 16  CREATININE 1.09  CALCIUM 8.7*      Intake/Output Summary (Last 24 hours) at 09/30/2021 1224 Last data filed at 09/30/2021 0818 Gross per 24 hour  Intake 354 ml  Output 800 ml  Net -446 ml         Physical Exam: Vital Signs Blood pressure 101/74, pulse 86, temperature 97.9 F (36.6 C), temperature source Oral, resp. rate 12, height 5\' 9"  (1.753 m), weight 82.9 kg, SpO2 100 %.   General: No acute distress, lying in bed Mood and affect are appropriate Heart: Regular rate and rhythm no rubs murmurs or extra sounds Lungs: CTAB, no R/R/W,  good air movement Abdomen: Positive bowel sounds, soft nontender to palpation, nondistended Extremities: No clubbing, cyanosis, or edema Skin: No evidence of breakdown, no evidence of rash   Psych: Pt's affect is a little flat. Pt is cooperative  Neuro:Speech clear. Keeps eyes turned to the right and able to move to left with some effort. Left lateral rectus weakness. Left central 7. Demonstrates almost a left inattention-improved. Follows commands. Altered Left finger to nose , decreased fine motor movement LLE. Strength 5/5 RUE and RLE and grossly 4 to 4+/5 LUE and LLE. Senses pain in all 4'. No resting tone. Musculoskeletal: No joint swelling or tenderness noted, ROM normal  Assessment/Plan: 1. Functional deficits which require 3+ hours per day of interdisciplinary therapy in a comprehensive inpatient rehab setting. Physiatrist is providing close team supervision and 24 hour management of  active medical problems listed below. Physiatrist and rehab team continue to assess barriers to discharge/monitor patient progress toward functional and medical goals  Care Tool:  Bathing    Body parts bathed by patient: Right arm, Chest, Abdomen, Left arm, Front perineal area, Right upper leg, Left upper leg, Face, Right lower leg, Left lower leg, Buttocks   Body parts bathed by helper: Buttocks     Bathing assist Assist Level: Minimal Assistance - Patient > 75%     Upper Body Dressing/Undressing Upper body dressing   What is the patient wearing?: Pull over shirt    Upper body assist Assist Level: Supervision/Verbal cueing    Lower Body Dressing/Undressing Lower body dressing      What is the patient wearing?: Incontinence brief, Pants     Lower body assist Assist for lower body dressing: Minimal Assistance - Patient > 75%     Toileting Toileting    Toileting assist Assist for toileting: Minimal Assistance - Patient > 75%     Transfers Chair/bed transfer  Transfers assist     Chair/bed transfer assist level: Minimal Assistance - Patient > 75%     Locomotion Ambulation   Ambulation assist  Assist level: Moderate Assistance - Patient 50 - 74% Assistive device: Walker-rolling Max distance: 300 feet   Walk 10 feet activity   Assist     Assist level: Moderate Assistance - Patient - 50 - 74% Assistive device: Walker-rolling   Walk 50 feet activity   Assist    Assist level: Moderate Assistance - Patient - 50 - 74% Assistive device: Walker-rolling    Walk 150 feet activity   Assist Walk 150 feet activity did not occur: Safety/medical concerns  Assist level: Moderate Assistance - Patient - 50 - 74% Assistive device: Walker-rolling    Walk 10 feet on uneven surface  activity   Assist     Assist level: Maximal Assistance - Patient 25 - 49% Assistive device: Hand held assist, Other (comment) (handrail)    Wheelchair     Assist Is the patient using a wheelchair?: Yes Type of Wheelchair: Manual    Wheelchair assist level: Supervision/Verbal cueing Max wheelchair distance: 314 feet    Wheelchair 50 feet with 2 turns activity    Assist        Assist Level: Supervision/Verbal cueing   Wheelchair 150 feet activity     Assist      Assist Level: Supervision/Verbal cueing   Blood pressure 101/74, pulse 86, temperature 97.9 F (36.6 C), temperature source Oral, resp. rate 12, height 5\' 9"  (1.753 m), weight 82.9 kg, SpO2 100 %.  Medical Problem List and Plan: 1. 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  Antithrombotics: -DVT/anticoagulation:  Pharmaceutical: Lovenox             -antiplatelet therapy: DAPT X 3 months followed by ASA 325 mg daily 3. Pain Management:  Tylenol prn.  4. Mood/Behavior/Sleep: LCSW to follow for evaluation and support.              --Trazodone prn for insomnia             -antipsychotic agents: N/A 5. Neuropsych/cognition: This patient is ?capable of making decisions on his own behalf. 6. Skin/Wound Care:  Routine pressure relief measures.  7. Fluids/Electrolytes/Nutrition: Monitor I/O. Check CMET in am. 8. HTN: Monitor BP TID--avoid hypotension with Right M2 occlusion.             --continue Cozaar, Coreg and HCTZ  --Will decrease HCTZ to 12.5mg  due to AKI  -7/31 will increase HCTZ back to 25mg  daily  -8/11 last BP 106/73, he is intermittently high but will hold off on increasing antihypertensives for now to avoid hypotension, continue to monitor  -8/15 reduce HCTZ back to 12.5, start flomax 0.4mg  Vitals:   09/30/21 0250 09/30/21 1044  BP: (!) 155/94 101/74  Pulse: 88 86   Resp: 17 12  Temp: 97.9 F (36.6 C)   SpO2: 100% 100%    9. T2DM: Hgb A1c- 8.1 (down from 12). Will continue to monitor BS ac/hs             continue Glipizide and metformin  -Well controlled, continue to monitor for now  -8/1 DC glipizide CBG (last 3)  Recent Labs    09/29/21 2158 09/30/21 0611 09/30/21 1129  GLUCAP  115* 98 135*   -Well controlled, monitor   10. AKI  -Will decrease HCTZ to 12.5mg   -NS 50 ml/hr startedd 7/28, Cr reviewed and has normalized  -Will DC fluids 7/31, recheck labs on Wednesday 8/2  -Scr 1.09  8/15, encourage PO intake to prevent repeat AKI, HCTZ decreased back to 12.5mg  11. HLD  -continue statin, heart healthy diet 12. Urinary incontinence  8/5- will order Timed voiding- q2 hours since cannot tell when going/needs to go or has any control- and cannot feel when wet  8/9 continence improving, continue timed voids  8/11 He had 1 dose oxybutynin yesterday, seem likely he had benefit and had continent episode, will increase oxybutynin to 5mg  TID, monitor PVR  8/15 Urinary retention last night and required IC, will decrease HCTZ and start Flomax 13. Constipation  8/5- LBM 4-5+ days ago- d/w pt- insisted needs to take bowel meds or could require disimpaction- pt agreed- will lgive Sorbitol and soap suds enema if no BM  8/6- Had at least 4 BM's- got cleaned out- didn't need enema.   -Continue senokot, Schedule miralax  -Change Miralax  and senokot to PRN 14. Anemia, mild  -HGB stable at 12.0 8/15  -Will check occult stool- pending     LOS:  19 days A FACE TO FACE EVALUATION WAS PERFORMED  9/15 09/30/2021, 12:24 PM

## 2021-09-30 NOTE — Progress Notes (Signed)
Occupational Therapy Session Note  Patient Details  Name: Shawn Austin MRN: [id number] Date of Birth: 10-05-1965  Today's Date: 09/30/2021 OT Individual Time: 0820-0930 OT Individual Time Calculation (min): 70 min    Short Term Goals: Week 3:  OT Short Term Goal 1 (Week 3): Pt will complete toilet transfers with CGA OT Short Term Goal 2 (Week 3): Pt will require no more than min cueing to maintain midline orientation during ambulatory transfer to the bathroom OT Short Term Goal 3 (Week 3): Pt will complete LB ADLs with (S) overall  Skilled Therapeutic Interventions/Progress Updates:    Pt received supine with no c/o pain, agreeable to OT session. He requested to start session with shower. Min A ambulatory transfer into the bathroom with frequent cueing required for R lean and midline awareness. He bathed UB seated with min cueing for remembering to bathe his RUE. Min cueing for locating items on the L side of the shower ,with pt needing to make a significant effort to turn head first and then gaze. He required only min A for bathing peri areas standing with use of the grab bar, however with removal of the grab bar support he required more like mod A (pt does not have grab bar in shower at home). He also required mod cueing for motor planning/attention to removing top from soap bottle. He transferred back to his w/c and completed oral care with set up assist. Shirt donned with (S), min A to don shorts. In standing he required frequent cueing for midline awareness and min A. Clear glasses with R vision block donned during ADLs. Pt taken to the BITS to work on Database administrator. Pt positioned perpendicular to the screen with his L side proximal to the screen to force full L visual scanning. Used the LUE to reach for static targets to promote improved accurate visual perception. He was much more accurate in his reaching today- 89% overall in accuracy. He was able to maintain a L head turn  and visual gaze with 1-2 breaks briefly but overall excellent endurance. He was brought to his new room in 4W06, passed off to RN.    Therapy Documentation Precautions:  Precautions Precautions: Fall Precaution Comments: R gaze preference, R lateral lean in sitting/standing Restrictions Weight Bearing Restrictions: No   Therapy/Group: Individual Therapy  Crissie Reese 09/30/2021, 6:35 AM

## 2021-09-30 NOTE — Progress Notes (Signed)
Speech Language Pathology Daily Session Note  Patient Details  Name: Shawn Austin MRN: [id number] Date of Birth: 1965-09-29  Today's Date: 09/30/2021 SLP Individual Time: 1345-1430 SLP Individual Time Calculation (min): 45 min  Short Term Goals: Week 3: SLP Short Term Goal 1 (Week 3): STG=LTG due to ELOS  Skilled Therapeutic Interventions: Skilled ST treatment focused on cognitive goals. Pt was semi-reclined in bed on arrival and agreeable to intervention. SLP facilitated jeopardy style activity by providing mod-to-max A verbal cues for categories including verbal reasoning and anticipatory problem solving skills; mod A verbal cues for functional recall; min A verbal cues for basic calculations. Pt recalled previously discussed information with 25% accuracy and mod A verbal cues following 2-3 minute delay. Overall pt is making slow gains toward cognitive goals. LTGs were modified to reflect slower than anticipated progress. Patient was left in bed with alarm activated and immediate needs within reach at end of session. Continue per current plan of care.    Pain  None/denied  Therapy/Group: Individual Therapy  Tamala Ser 09/30/2021, 2:32 PM

## 2021-09-30 NOTE — Progress Notes (Signed)
Patient ID: Shawn Austin, male   DOB: 1965-07-23, 56 y.o.   MRN: [id number]  Did speak with Katina-girlfriend to set up family education for Friday 8/18 from 1:00-3:30. Can come back if needed. May want his cousin to come in also.

## 2021-10-01 ENCOUNTER — Inpatient Hospital Stay (HOSPITAL_COMMUNITY): Payer: Medicaid Other

## 2021-10-01 DIAGNOSIS — E119 Type 2 diabetes mellitus without complications: Secondary | ICD-10-CM

## 2021-10-01 LAB — GLUCOSE, CAPILLARY
Glucose-Capillary: 102 mg/dL — ABNORMAL HIGH (ref 70–99)
Glucose-Capillary: 174 mg/dL — ABNORMAL HIGH (ref 70–99)
Glucose-Capillary: 167 mg/dL — ABNORMAL HIGH (ref 70–99)
Glucose-Capillary: 125 mg/dL — ABNORMAL HIGH (ref 70–99)

## 2021-10-01 MED ORDER — SENNOSIDES-DOCUSATE SODIUM 8.6-50 MG PO TABS
2.0000 | ORAL_TABLET | Freq: Every day | ORAL | Status: DC
  Administered 2021-10-02 – 2021-10-10 (×9): 2 via ORAL
  Filled 2021-10-01 (×9): qty 2

## 2021-10-01 MED ORDER — POLYETHYLENE GLYCOL 3350 17 G PO PACK: 17.0000 g | PACK | Freq: Once | ORAL | Status: AC

## 2021-10-01 MED ORDER — SENNOSIDES-DOCUSATE SODIUM 8.6-50 MG PO TABS
1.0000 | ORAL_TABLET | Freq: Every day | ORAL | Status: DC
  Administered 2021-10-01: 1 via ORAL
  Filled 2021-10-01: qty 1

## 2021-10-01 MED ORDER — OXYBUTYNIN CHLORIDE 5 MG PO TABS
2.5000 mg | ORAL_TABLET | Freq: Two times a day (BID) | ORAL | Status: DC
  Administered 2021-10-02: 2.5 mg via ORAL
  Filled 2021-10-01 (×2): qty 0.5

## 2021-10-01 NOTE — Progress Notes (Signed)
Slept well last night into this morning. Remains alert, oriented, and denies pain. Bladder scan X2 this shift. Had to be Catheterized (Intermittent) with coude 16 french with 2% lidocaine gel under aseptic technique after Bladder scan volume of > 500 mls.  Recovered volume of 700 mls of yellow straw urine without any sediments documented. Peri care done and repositioned for comfort. All safety measures adhered to. Will continue to monitor.

## 2021-10-01 NOTE — Progress Notes (Signed)
Patient ID: Shawn Austin, male   DOB: September 20, 1965, 56 y.o.   MRN: [id number]  Patient returned from x-ray.

## 2021-10-01 NOTE — Progress Notes (Signed)
Occupational Therapy Weekly Progress Note  Patient Details  Name: Shawn Austin MRN: [id number] Date of Birth: August 09, 1965  Beginning of progress report period: September 26, 2021 End of progress report period: October 01, 2021   Patient has met 0 of 3 short term goals. [name] continues to progress toward his ADL goals, improving to CGA-min A for stand pivot transfers. His R gaze preference is still severe and impacts his ability to perform longer distance gait without moderate cueing for correcting his R lean. Family education is scheduled for this Friday with his wife.   Patient continues to demonstrate the following deficits: muscle weakness, decreased cardiorespiratoy endurance, decreased visual perceptual skills and decreased visual motor skills, decreased midline orientation and decreased motor planning, decreased initiation, decreased attention, decreased awareness, decreased problem solving, decreased safety awareness, decreased memory, and delayed processing, and decreased sitting balance, decreased standing balance, decreased postural control, and decreased balance strategies and therefore will continue to benefit from skilled OT intervention to enhance overall performance with BADL and iADL.  Patient progressing toward long term goals..  Plan of care revisions: Several goals downgraded to CGA to reflect need for hands on care to keep pt safe.  OT Short Term Goals Week 3:  OT Short Term Goal 1 (Week 3): Pt will complete toilet transfers with CGA OT Short Term Goal 1 - Progress (Week 3): Progressing toward goal OT Short Term Goal 2 (Week 3): Pt will require no more than min cueing to maintain midline orientation during ambulatory transfer to the bathroom OT Short Term Goal 2 - Progress (Week 3): Progressing toward goal OT Short Term Goal 3 (Week 3): Pt will complete LB ADLs with (S) overall OT Short Term Goal 3 - Progress (Week 3): Progressing toward goal Week 4:  OT Short Term Goal 1  (Week 4): STG= LTG d/t ELOS    [name] 10/01/2021, 1:17 PM

## 2021-10-01 NOTE — Progress Notes (Signed)
Patient ID: Shawn Austin, male   DOB: 1965/11/28, 56 y.o.   MRN: [id number]  Met with pt to update him regarding team conference and family education on Friday. He is ready to go home and has been since he got here. [name] to be here Friday afternoon to do hands on care in preparation for discharge Tuesday may need to come in again on Monday will see on Friday and see how it goes. Work toward discharge 8/22

## 2021-10-01 NOTE — Progress Notes (Signed)
Speech Language Pathology Daily Session Note  Patient Details  Name: Shawn Austin MRN: [id number] Date of Birth: Jun 21, 1965  Today's Date: 10/01/2021 SLP Individual Time: 1300-1400 SLP Individual Time Calculation (min): 60 min  Short Term Goals: Week 3: SLP Short Term Goal 1 (Week 3): STG=LTG due to ELOS  Skilled Therapeutic Interventions: Skilled ST treatment focused on cognitive goals. Pt was received supine in bed on arrival with untouched lunch tray at bedside. Pt endorsed nausea and declined lunch. Denied nausea by end of session. Stated he didn't like egg sandwiches but receptive to encouragement to order another sandwich through food & nutrition services. SLP dialed number and pt ordered new meal with sup A verbal cues to recall preferences that were discussed with SLP prior to call. Pt was told new tray would be delivered within ~40 mins.   SLP reviewed pt's therapists by discipline and name as discussed during yesterday's session. Pt was unable to recall and required max A verbal cues (field of 3 choices) to recall 1/3 (33%). Pt stated he was unable to read dry erase board in room and therapy schedule as use of external aid due to visual deficits. Continued to require at least mod A for recall throughout session.   Pt acknowledged changes with memory s/p CVA however continues to exhibit impaired insight into other cognitive and physical impairments. Pt stated he wouldn't have any difficulty with tasks at home because he would be able to "think better." SLP provided further stroke education by educating on locations involved with CVA, their unique functions, and impact on cognitive-communication + motor functions.   Pt required max A verbal cues for engagement in anticipatory problem solving for areas of need at discharge and often gave short and quite broad responses. Pt does not feel he will need a lot of support when he returns home and is also unsure of level of support available.  Highly recommend family education prior to discharge with 24 hour support for safety d/t severity of cognitive deficits impacting safety awareness and insight. Patient was left in bed with alarm activated and immediate needs within reach at end of session. Sandwich had not arrived yet at end of session. Continue per current plan of care.      Pain  None/denied  Therapy/Group: Individual Therapy  Alizeh Madril T Karrin Eisenmenger 10/01/2021, 1:20 PM

## 2021-10-01 NOTE — Progress Notes (Signed)
Occupational Therapy Session Note  Patient Details  Name: Shawn Austin MRN: [id number] Date of Birth: 1965-11-01  Today's Date: 10/01/2021 OT Individual Time: 1000-1100 OT Individual Time Calculation (min): 60 min    Short Term Goals: Week 3:  OT Short Term Goal 1 (Week 3): Pt will complete toilet transfers with CGA OT Short Term Goal 2 (Week 3): Pt will require no more than min cueing to maintain midline orientation during ambulatory transfer to the bathroom OT Short Term Goal 3 (Week 3): Pt will complete LB ADLs with (S) overall  Skilled Therapeutic Interventions/Progress Updates:    Pt received supine with no c/o pain, agreeable to OT session. He reported that he was waiting to be cathed d/t not being able to void urine. Encouraged him to try again and he was agreeable. He completed sit > stand from EOB with min A. Min A ambulatory transfer into the bathroom with the RW, requiring mod cueing for R lean correction. He required min A for clothing management in standing and then voided urine in standing! He was instructed to turn to sit on the toilet d/t some urine reaching his brief, and he required mod A to turn d/t fast impulsive movements with R LOB. He donned a new brief and shorts with CGA. Pt returned to the EOB and OT called his wife to discuss shower transfers at home and recommended DME. Wife agreeable with using BSC as shower chair and told pt is NOT to stand in the shower. He was taken to the therapy gym via the w/c. He completed 50 ft of functional mobility with the RW, requiring min A overall, mod A during the turn d/t impulsive movements and R LOB. From EOM he worked on blocked  Associate Professor sit <> stands to increase safety/independence with ADL transfers. OT stayed on his L to encourage L lean and to provide anchor, he required min A overall ,occasional large LOB to the R. He worked on cross body reaching in standing with large anterior LOB requiring mod A to recover. He  completed 100 ft of functional mobility to improve dynamic standing balance and simulate in home mobility with min A overall to recover R lean. He returned to his room and was left sitting up in the w/c with all needs met.    Therapy Documentation Precautions:  Precautions Precautions: Fall Precaution Comments: R gaze preference, R lateral lean in sitting/standing Restrictions Weight Bearing Restrictions: No   Therapy/Group: Individual Therapy  [name] 10/01/2021, 6:40 AM

## 2021-10-01 NOTE — Progress Notes (Signed)
Physical Therapy Session Note  Patient Details  Name: Shawn Austin MRN: [id number] Date of Birth: Feb 23, 1965  Today's Date: 10/01/2021 PT Individual Time: 6063-0160  PT Individual Time Calculation (min): 30 min and  Today's Date: 10/01/2021 PT Missed Time: 30 Minutes and 45 minutes Missed Time Reason: Other (Comment) (meal) and 2nd session due to nausea  Short Term Goals: Week 3:  PT Short Term Goal 1 (Week 3): Pt will perform stand pivot transfer with CGA PT Short Term Goal 2 (Week 3): Pt will ambulate 25 ft with min A  Skilled Therapeutic Interventions/Progress Updates:    Session 1: Pt received supported upright in bed eating lunch stating he had to order a different meal tray because his first lunch was "no good." Pt requesting 30-25minutes to eat prior to starting therapy session. Therapist to return. Missed 30 minutes of skilled physical therapy.  Therapist returned and pt now eager to get up, trying to reach across his bed to move his w/c in a position for him to transfer demonstrating decreased safety awareness. Supine>sitting R EOB, HOB elevated and using bedrail as needed, with close supervision for safety. Donned glasses with R visual field block due to strong R gaze preference. Donned tennis shoes max assist for time management.   Sit>stand EOB>RW with min assist for balance due to minor R lean - requires verbal cuing to recall safe hand placement during transfer when using AD.   Gait training ~132ft to dayroom using RW (+2 providing w/c follow to allow longer distance gait - pt requested seated rest break but with min encouragement continued ambulating) - requires consistent min assist and intermittent mod assist for balance due to R anterior lean that worsens when turning due to R LE adducting/scissoring especially when turning towards R.   Gait training 180ft x2 using B HHA with +2 mod assist for balance targeting balance awareness and improved midline orientation -  continues to demo strong R anterior whole body lean that worsens intermittently and especially when turning  - with fatigue, starts to have R LE scissoring that makes R lean even more severe requiring max assist to maintain upright - pt moves very quickly attempting to take too large of steps causing worsening instability with poor safety awareness of need to correct this - he has quick awareness of when LOB is occurring but very delayed initiation for balance recovery strategy placing him at a high fall risk - repeated cuing throughout to maintain trunk upright because pt starts to flex trunk forward  Transported back to room to participate in timed toileting protocol with MD request to have pt stand while trying to void to see if this will facilitate continence.   Gait ~47ft in/out bathroom using RW with heavy min assist of 1 for balance due to R lean and pt continuing to move quickly and impulsively when navigating over bathroom threshold. Standing with min/mod assist for balance due to R lean while pt performed LB clothing management with min assist. Pt unable to void - NT notified.  At end of session, pt left seated in w/c with needs in reach and seat belt alarm on.   Session 2: Nurse tech reports that pt had just projectile vomited a large amount and nurse tech had just finished assisting pt with cleaning up. Pt received supine in bed and reports he does not feel well enough to participate in therapy session at this time. Pt denies any needs at this time - left supine in bed with  call bell in reach and bed alarm on. Missed 45 minutes of skilled physical therapy.  Therapy Documentation Precautions:  Precautions Precautions: Fall Precaution Comments: R gaze preference, R lateral lean in sitting/standing Restrictions Weight Bearing Restrictions: No   Pain:  Session 1: No reports of pain throughout session.  Session 2: No reports of pain.   Therapy/Group: Individual Therapy  Ginny Forth , PT, DPT, NCS, CSRS 10/01/2021, 1:00 PM

## 2021-10-01 NOTE — Progress Notes (Signed)
Patient ID: AMAD MAU, male   DOB: 1965/05/21, 56 y.o.   MRN: [id number]  Patient experienced 2 emesis episodes today. Notified MD and PA-C. Orders received for Senna, Miralax, and Compazine. Patient tolerated medications well. Orders placed for KUB, patient down to x-ray now. Will await results.  Kennyth Arnold, RN3, BSN, CBIS, CRRN, [facility]

## 2021-10-01 NOTE — Plan of Care (Signed)
Downgraded d/t ongoing severe L inattention and R gaze preference  Problem: RH Balance Goal: LTG Patient will maintain dynamic standing with ADLs (OT) Description: LTG:  Patient will maintain dynamic standing balance with assist during activities of daily living (OT)  Flowsheets (Taken 10/01/2021 1324) LTG: Pt will maintain dynamic standing balance during ADLs with: (downgraded d/t severe L inattention- SD) Contact Guard/Touching assist   Problem: Sit to Stand Goal: LTG:  Patient will perform sit to stand in prep for activites of daily living with assistance level (OT) Description: LTG:  Patient will perform sit to stand in prep for activites of daily living with assistance level (OT) Flowsheets (Taken 10/01/2021 1324) LTG: PT will perform sit to stand in prep for activites of daily living with assistance level: (downgraded d/t severe L inattention- SD) Contact Guard/Touching assist   Problem: RH Dressing Goal: LTG Patient will perform lower body dressing w/assist (OT) Description: LTG: Patient will perform lower body dressing with assist, with/without cues in positioning using equipment (OT) Flowsheets (Taken 10/01/2021 1324) LTG: Pt will perform lower body dressing with assistance level of: (downgraded d/t severe L inattention- SD) Contact Guard/Touching assist   Problem: RH Toileting Goal: LTG Patient will perform toileting task (3/3 steps) with assistance level (OT) Description: LTG: Patient will perform toileting task (3/3 steps) with assistance level (OT)  Flowsheets (Taken 10/01/2021 1324) LTG: Pt will perform toileting task (3/3 steps) with assistance level: (downgraded d/t severe L inattention- SD) Contact Guard/Touching assist   Problem: RH Toilet Transfers Goal: LTG Patient will perform toilet transfers w/assist (OT) Description: LTG: Patient will perform toilet transfers with assist, with/without cues using equipment (OT) Flowsheets (Taken 10/01/2021 1324) LTG: Pt will perform  toilet transfers with assistance level of: (downgraded d/t severe L inattention- SD) Contact Guard/Touching assist   Problem: RH Tub/Shower Transfers Goal: LTG Patient will perform tub/shower transfers w/assist (OT) Description: LTG: Patient will perform tub/shower transfers with assist, with/without cues using equipment (OT) Flowsheets (Taken 10/01/2021 1324) LTG: Pt will perform tub/shower stall transfers with assistance level of: (downgraded d/t severe L inattention- SD) Contact Guard/Touching assist   Problem: RH Attention Goal: LTG Patient will demonstrate this level of attention during functional activites (OT) Description: LTG:  Patient will demonstrate this level of attention during functional activites  (OT) Flowsheets (Taken 10/01/2021 1324) LTG: Patient will demonstrate this level of attention during functional activites (OT): Minimal Assistance - Patient > 75%   Problem: RH Awareness Goal: LTG: Patient will demonstrate awareness during functional activites type of (OT) Description: LTG: Patient will demonstrate awareness during functional activites type of (OT) Flowsheets (Taken 10/01/2021 1324) LTG: Patient will demonstrate awareness during functional activites type of (OT): Minimal Assistance - Patient > 75%

## 2021-10-01 NOTE — Progress Notes (Signed)
Patient ID: Shawn Austin, male   DOB: March 02, 1965, 56 y.o.   MRN: [id number]  Attempted to intermittent catheterize patient with 16 fr. Coude and 2% lidocaine gel with no success. Will attempt to intermittent catheterize again after therapy. Patient did attempt to void on the toilet, also no success. Patient returned to bed and bed alarm activated. Patient waiting on first therapy session.  Kennyth Arnold, RN3, BSN, CBIS, CRRN, [facility]

## 2021-10-01 NOTE — Patient Care Conference (Signed)
Inpatient RehabilitationTeam Conference and Plan of Care Update Date: 10/01/2021   Time: 12:09 PM    Patient Name: Shawn Austin      Medical Record Number: [id number]  Date of Birth: July 23, 1965 Sex: Male         Room/Bed: 4W06C/4W06C-01 Payor Info: Payor: /    Admit Date/Time:  09/11/2021  4:02 PM  Primary Diagnosis:  Acute stroke of medulla oblongata [facility])  Hospital Problems: Principal Problem:   Acute stroke of medulla oblongata (HCC) Active Problems:   Diabetes mellitus (HCC)   Primary hypertension   Urinary incontinence    Expected Discharge Date: Expected Discharge Date: 10/07/21  Team Members Present: Physician leading conference: Dr. Fanny Dance Social Worker Present: Dossie Der, LCSW Nurse Present: Chana Bode, RN PT Present: Raechel Chute, PT OT Present: Jake Shark, OT SLP Present: Feliberto Gottron, SLP PPS Coordinator present : Fae Pippin, SLP     Current Status/Progress Goal Weekly Team Focus  Bowel/Bladder     Incont at [facility]; needs to stand to void   Continent   toileting  Swallow/Nutrition/ Hydration             ADL's   Severe R gaze preference and mod R lean. Min A transfers but still requires frequent cueing. Min A LB ADls, (S) UB  supervision overall  Visual perception/scanning, ADLs, midline orientation, d/c planning, transfers   Mobility   supervision bed, CGA STS, min A stand pivot. 180 ft with RW mod A, steps x 4 with 2 HR's min  supervision overall  transfers, gait with RW, turning, lateral left weight shift, pt/family edu   Communication             Safety/Cognition/ Behavioral Observations  still requiring mod-to-max with poor insight into deficits, impaired problem solving, error awareness.  min-to-mod A (goals downgraded 8/15)  problem solving, memory, error awareness, left visual scanning, insight   Pain     N/a        Skin     N/a          Discharge Planning:  Girlfriend scheduled for family education on Friday  afternoon to do hands on education prior to DC. Will order DME and try to get [facility] charity follow up at home   Team Discussion: Patient with incontinence at [facility]; constipation addressed.  MD adjusting meds and noted CBGs stable. Continue to note right gaze preference with occlusion glasses in use.  Patient on target to meet rehab goals: yes, currently needs CGA - mod assist to correct balance during transfers. Needs min assist for lower body care and supervision for upper body care/ADLs. Completes STS and Stand pivot with CGA. Needs mod assist for ambulation and min assist for steps. Requires mod - max assist for cognitive function due to poor insight, problem solving deficits. Goals for discharge set for supervision; changed to min-mod assist.  *See Care Plan and progress notes for long and short-term goals.   Revisions to Treatment Plan:  Downgraded goals for OT/PT  Teaching Needs: Safety, cognitive tips, medications, CBG monitoring, dietary modification recommendations, transfers, no ambulation in the home; w/c level, toileting, etc.  Current Barriers to Discharge: Decreased caregiver support, Home enviroment access/layout, Insurance for SNF coverage, and lack of insurance for [facility] follow up services  Possible Resolutions to Barriers: Family education [facility] follow up services requested DME: BSC, RW, W/C     Medical Summary Current Status: MCA CVA, Urinary retention, Constipation, HTN, DM  Barriers to Discharge: Incontinence;Medical stability;Home  enviroment access/layout  Barriers to Discharge Comments: MCA CVA, Urinary retention, Constipation, HTN, DM Possible Resolutions to [employer] Focus: adjust medications for bladder function, laxatives, follow HTN and adjust antihypertensives   Continued Need for Acute Rehabilitation Level of Care: The patient requires daily medical management by a physician with specialized training in physical medicine and rehabilitation for the following  reasons: Direction of a multidisciplinary physical rehabilitation program to maximize functional independence : Yes Medical management of patient stability for increased activity during participation in an intensive rehabilitation regime.: Yes Analysis of laboratory values and/or radiology reports with any subsequent need for medication adjustment and/or medical intervention. : Yes   I attest that I was present, lead the team conference, and concur with the assessment and plan of the team.   Chana Bode B 10/01/2021, 3:49 PM

## 2021-10-01 NOTE — Progress Notes (Signed)
PROGRESS NOTE   Subjective/Complaints:  He continues to have urinary retention requiring IC.  Therapy reports he was able to void today when he was standing.   LBM 8/15  ROS: No CP, SOB, Abdominal pain, N/V/C, HA,vision changes   Objective:   No results found. Recent Labs    09/29/21 0851  WBC 6.2  HGB 12.0*  HCT 35.1*  PLT 523*     Recent Labs    09/29/21 0851  NA 132*  K 4.0  CL 102  CO2 22  GLUCOSE 132*  BUN 16  CREATININE 1.09  CALCIUM 8.7*      Intake/Output Summary (Last 24 hours) at 10/01/2021 1146 Last data filed at 10/01/2021 1130 Gross per 24 hour  Intake 40 ml  Output 1700 ml  Net -1660 ml         Physical Exam: Vital Signs Blood pressure (!) 147/92, pulse 98, temperature 97.8 F (36.6 C), resp. rate 18, height 5\' 9"  (1.753 m), weight 82.9 kg, SpO2 90 %.   General: No acute distress, lying in bed Heart: Regular rate and rhythm no rubs murmurs or extra sounds Lungs: CTAB, no R/R/W,  normal rate Abdomen: Positive bowel sounds, soft nontender to palpation, nondistended Extremities: No clubbing, cyanosis, or edema Skin: No evidence of breakdown, no evidence of rash   Psych: Pt's affect is a little flat. Pt is cooperative  Neuro:Speech clear. Keeps eyes turned to the right and able to move to left with some effort. Left lateral rectus weakness. Left central 7. Demonstrates almost a left inattention-improved. Follows commands. Altered Left finger to nose , decreased fine motor movement LLE. Strength 5/5 RUE and RLE and grossly 4 to 4+/5 LUE and LLE. Senses pain in all 4. No resting tone. Musculoskeletal: No joint swelling or tenderness noted, ROM normal  Assessment/Plan: 1. Functional deficits which require 3+ hours per day of interdisciplinary therapy in a comprehensive inpatient rehab setting. Physiatrist is providing close team supervision and 24 hour management of active medical  problems listed below. Physiatrist and rehab team continue to assess barriers to discharge/monitor patient progress toward functional and medical goals  Care Tool:  Bathing    Body parts bathed by patient: Right arm, Chest, Abdomen, Left arm, Front perineal area, Right upper leg, Left upper leg, Face, Right lower leg, Left lower leg, Buttocks   Body parts bathed by helper: Buttocks     Bathing assist Assist Level: Minimal Assistance - Patient > 75%     Upper Body Dressing/Undressing Upper body dressing   What is the patient wearing?: Pull over shirt    Upper body assist Assist Level: Supervision/Verbal cueing    Lower Body Dressing/Undressing Lower body dressing      What is the patient wearing?: Incontinence brief, Pants     Lower body assist Assist for lower body dressing: Minimal Assistance - Patient > 75%     Toileting Toileting    Toileting assist Assist for toileting: Minimal Assistance - Patient > 75%     Transfers Chair/bed transfer  Transfers assist     Chair/bed transfer assist level: Minimal Assistance - Patient > 75%     Locomotion Ambulation  Ambulation assist      Assist level: Moderate Assistance - Patient 50 - 74% Assistive device: Walker-rolling Max distance: 180 feet   Walk 10 feet activity   Assist     Assist level: Moderate Assistance - Patient - 50 - 74% Assistive device: Walker-rolling   Walk 50 feet activity   Assist    Assist level: Moderate Assistance - Patient - 50 - 74% Assistive device: Walker-rolling    Walk 150 feet activity   Assist Walk 150 feet activity did not occur: Safety/medical concerns  Assist level: Moderate Assistance - Patient - 50 - 74% Assistive device: Walker-rolling    Walk 10 feet on uneven surface  activity   Assist     Assist level: Maximal Assistance - Patient 25 - 49% Assistive device: Hand held assist, Other (comment) (handrail)   Wheelchair     Assist Is the  patient using a wheelchair?: Yes Type of Wheelchair: Manual    Wheelchair assist level: Supervision/Verbal cueing Max wheelchair distance: 314 feet    Wheelchair 50 feet with 2 turns activity    Assist        Assist Level: Supervision/Verbal cueing   Wheelchair 150 feet activity     Assist      Assist Level: Supervision/Verbal cueing   Blood pressure (!) 147/92, pulse 98, temperature 97.8 F (36.6 C), resp. rate 18, height 5\' 9"  (1.753 m), weight 82.9 kg, SpO2 90 %.  Medical Problem List and Plan: 1. Functional deficits secondary to right MCA distribution infarct, likely embolic             -Patient may shower             -ELOS/Goals: 8/22, supervision- Min A  Continue CIR- PT, OT and SLP  -Seen by Neruops[facility], appreciate assistance  -Will ask if therapy can start a little later in AM if possible  -Team conference today 2.  Antithrombotics: -DVT/anticoagulation:  Pharmaceutical: Lovenox             -antiplatelet therapy: DAPT X 3 months followed by ASA 325 mg daily 3. Pain Management:  Tylenol prn.  4. Mood/Behavior/Sleep: LCSW to follow for evaluation and support.              --Trazodone prn for insomnia             -antipsychotic agents: N/A 5. Neuropsych/cognition: This patient is ?capable of making decisions on his own behalf. 6. Skin/Wound Care:  Routine pressure relief measures.  7. Fluids/Electrolytes/Nutrition: Monitor I/O. Check CMET in am. 8. HTN: Monitor BP TID--avoid hypotension with Right M2 occlusion.             --continue Cozaar, Coreg and HCTZ  --Will decrease HCTZ to 12.5mg  due to AKI  -7/31 will increase HCTZ back to 25mg  daily  -8/11 last BP 106/73, he is intermittently high but will hold off on increasing antihypertensives for now to avoid hypotension, continue to monitor  -8/15 reduce HCTZ back to 12.5 due to frequent urination, start flomax 0.4mg   Intermittently elevated, continue to monitor,consider increase Cozzar if remains above  goal Vitals:   10/01/21 0330 10/01/21 1227  BP: (!) 147/92 112/83  Pulse: 98 79  Resp: 18 16  Temp: 97.8 F (36.6 C) 97.7 F (36.5 C)  SpO2: 90% 100%    9. T2DM: Hgb A1c- 8.1 (down from 12). Will continue to monitor BS ac/hs             continue Glipizide and metformin  -  Well controlled, continue to monitor for now  -8/1 DC glipizide CBG (last 3)  Recent Labs    09/30/21 1711 09/30/21 2116 10/01/21 0614  GLUCAP 139* 115* 102*   -continues to be well controlled   10. AKI  -Will decrease HCTZ to 12.5mg   -NS 50 ml/hr startedd 7/28, Cr reviewed and has normalized  -Will DC fluids 7/31, recheck labs on Wednesday 8/2  -Scr 1.09  8/15, encourage PO intake to prevent repeat AKI, HCTZ decreased back to 12.5mg  11. HLD  -continue statin, heart healthy diet 12. Urinary incontinence  8/5- will order Timed voiding- q2 hours since cannot tell when going/needs to go or has any control- and cannot feel when wet  8/9 continence improving, continue timed voids  8/11 He had 1 dose oxybutynin yesterday, seem likely he had benefit and had continent episode, will increase oxybutynin to 5mg  TID, monitor PVR  8/15 Urinary retention last night and required IC, will decrease HCTZ and start Flomax  8/16 Urinary retention requiring IC, may be related to oxybutynin, will decrease to 2.5mg  BID, will ask to stand pt up when attempting to void 13. Constipation  -improved overall, senokot daily, PRN miralax 14. Anemia, mild  -HGB stable at 12.0 8/15  -Will check occult stool- pending     LOS:  20 days A FACE TO FACE EVALUATION WAS PERFORMED  9/15 10/01/2021, 11:46 AM

## 2021-10-02 ENCOUNTER — Inpatient Hospital Stay (HOSPITAL_COMMUNITY): Payer: Medicaid Other

## 2021-10-02 LAB — CBC WITH DIFFERENTIAL/PLATELET
Abs Immature Granulocytes: 0.01 10*3/uL (ref 0.00–0.07)
Basophils Absolute: 0 10*3/uL (ref 0.0–0.1)
Basophils Relative: 1 %
Eosinophils Absolute: 0.2 10*3/uL (ref 0.0–0.5)
Eosinophils Relative: 3 %
HCT: 35.2 % — ABNORMAL LOW (ref 39.0–52.0)
Hemoglobin: 12.2 g/dL — ABNORMAL LOW (ref 13.0–17.0)
Immature Granulocytes: 0 %
Lymphocytes Relative: 38 %
Lymphs Abs: 2.1 10*3/uL (ref 0.7–4.0)
MCH: 27.7 pg (ref 26.0–34.0)
MCHC: 34.7 g/dL (ref 30.0–36.0)
MCV: 80 fL (ref 80.0–100.0)
Monocytes Absolute: 0.5 10*3/uL (ref 0.1–1.0)
Monocytes Relative: 9 %
Neutro Abs: 2.8 10*3/uL (ref 1.7–7.7)
Neutrophils Relative %: 49 %
Platelets: 481 10*3/uL — ABNORMAL HIGH (ref 150–400)
RBC: 4.4 MIL/uL (ref 4.22–5.81)
RDW: 12.2 % (ref 11.5–15.5)
WBC: 5.6 10*3/uL (ref 4.0–10.5)
nRBC: 0 % (ref 0.0–0.2)

## 2021-10-02 LAB — BASIC METABOLIC PANEL
Anion gap: 8 (ref 5–15)
BUN: 13 mg/dL (ref 6–20)
CO2: 22 mmol/L (ref 22–32)
Calcium: 9 mg/dL (ref 8.9–10.3)
Chloride: 102 mmol/L (ref 98–111)
Creatinine, Ser: 1.01 mg/dL (ref 0.61–1.24)
GFR, Estimated: >60 mL/min (ref >60)
Glucose, Bld: 124 mg/dL — ABNORMAL HIGH (ref 70–99)
Potassium: 4.2 mmol/L (ref 3.5–5.1)
Sodium: 132 mmol/L — ABNORMAL LOW (ref 135–145)

## 2021-10-02 LAB — GLUCOSE, CAPILLARY
Glucose-Capillary: 91 mg/dL (ref 70–99)
Glucose-Capillary: 113 mg/dL — ABNORMAL HIGH (ref 70–99)
Glucose-Capillary: 119 mg/dL — ABNORMAL HIGH (ref 70–99)
Glucose-Capillary: 74 mg/dL (ref 70–99)

## 2021-10-02 LAB — OCCULT BLOOD X 1 CARD TO LAB, STOOL: Fecal Occult Bld: NEGATIVE

## 2021-10-02 NOTE — Progress Notes (Signed)
Patient continues to be limited by progressive issues with nausea for the past 2-3 days. No dizziness and PT reporting that gaze stabilization /gait is better. KUB showed abdomen distension due to air and was placed on liquid diet but continues to have symptoms limiting therapy today.   He also continues to have urinary retention requiring I/O caths. Will d/c ditropan as can cause gastroparesis and retention no better with decrease in dose. Discussed with Dr. Riley Kill.  NGT ordered to decompress stomach. Symptoms likely GI in nature from gastric distension but will also get CT head to rule out progression of stroke.

## 2021-10-02 NOTE — Progress Notes (Signed)
Speech Language Pathology Daily Session Note  Patient Details  Name: Shawn Austin MRN: [id number] Date of Birth: June 04, 1965  Today's Date: 10/02/2021 SLP Individual Time: 1103-1200 SLP Individual Time Calculation (min): 57 min  Short Term Goals: Week 3: SLP Short Term Goal 1 (Week 3): STG=LTG due to ELOS  Skilled Therapeutic Interventions: Skilled ST treatment focused on cognitive goals. Pt received supine in bed on arrival and reported he was told by the doctor to "hold off on therapy today." There was no mention of this in MD's note signed at 1055. SLP checked with nurse who confirmed there was no hold on therapy and to proceed with therapy schedule as planned. SLP informed pt who required significant coaxing and more than reasonable amount of time to make way to EOB to transfer to w/c for treatment. Pt transferred with sup A verbal cues for safety due to attempts to stand without wheelchair in place and breaks locked. Pt appeared less interactive today and often would not respond to therapist or responded with "I don't know" often.   SLP facilitated session by providing mod A verbal cues for implementation of memory strategies, visual scanning, and sequencing with memory activity via BITS to achieve 60% accuracy, progressing to 66% accuracy during 2nd trial. Faclitated visual scanning by selecting letters of the alphabet in sequential order with 60% accuracy and min A verbal cues for scanning, and 67% accuracy for selecting numbers in sequential order. SLP facilitated orientation with sup A verbal cues for use of external aid to orient to date. Patient was left in wheelchair with alarm activated and immediate needs within reach at end of session. Continue per current plan of care.      Pain  None/denied  Therapy/Group: Individual Therapy  Tamala Ser 10/02/2021, 12:46 PM

## 2021-10-02 NOTE — Progress Notes (Signed)
Physical Therapy Session Note  Patient Details  Name: Shawn Austin MRN: [id number] Date of Birth: 1965-08-20  Today's Date: 10/02/2021 PT Individual Time: 3567-0141 PT Individual Time Calculation (min): 44 min  and Today's Date: 10/02/2021 PT Missed Time: 31 Minutes Missed Time Reason: Patient ill (Comment) (Nausea)  Short Term Goals: Week 1:  PT Short Term Goal 1 (Week 1): Pt will perform sit to stand with LRAD and min A PT Short Term Goal 1 - Progress (Week 1): Met PT Short Term Goal 2 (Week 1): Pt will perform stand pivot transfer with LRAD and mod A PT Short Term Goal 2 - Progress (Week 1): Met PT Short Term Goal 3 (Week 1): Pt will ambulate 50 ft with LRAD and mod A PT Short Term Goal 3 - Progress (Week 1): Progressing toward goal Week 2:  PT Short Term Goal 1 (Week 2): Pt will perform sit to stand with LRAD and CGA PT Short Term Goal 1 - Progress (Week 2): Met PT Short Term Goal 2 (Week 2): Pt will perform stand pivot transfer with LRAD and min A PT Short Term Goal 2 - Progress (Week 2): Met PT Short Term Goal 3 (Week 2): Pt will ambulate 25 feet with mod A and LRAD PT Short Term Goal 3 - Progress (Week 2): Met PT Short Term Goal 5 (Week 2): Pt will perform sit to stand with SBA Week 3:  PT Short Term Goal 1 (Week 3): Pt will perform stand pivot transfer with CGA PT Short Term Goal 2 (Week 3): Pt will ambulate 25 ft with min A  Skilled Therapeutic Interventions/Progress Updates:      Therapy Documentation Precautions:  Precautions Precautions: Fall Precaution Comments: R gaze preference, R lateral lean in sitting/standing Restrictions Weight Bearing Restrictions: No  Pt received seated in w/c at bedside and declines pain throughout session. Pt states he cannot leave his room. PT viewed patient's chart and no medical hold present. Pt agreeable to participate in therapy. Pt transported to main gym for time management and energy conservation. Pt participated in gait  training with RW and required min A for safety along with verbal cueing to encourage left weight shift. Pt ambulated ~180 ft with 1 standing rest break. Pt reported onset of nausea as soon as he sat down in w/c after gait training. Pt provided with extensive seated rest and nausea continued. PT updated nursing and transported patient to room in w/c. Pt requires CGA with sit to stand and min A with stand step turn transfer with RW to sit edge of bed. Pt continues to require verbal cues for hand placement and sequencing with transfers. Pt supervision for sit to supine and vitals assessed. BP: 139/91 (104), pulse: 78, and O2: 96%. Pt left semi-reclined in bed with all needs in reach and bed alarm on. Pt missed total of 31 minutes of skilled physical therapy due to nausea.   Therapy/Group: Individual Therapy  Verl Dicker Verl Dicker PT, DPT  10/02/2021, 7:48 AM

## 2021-10-02 NOTE — Progress Notes (Signed)
Slept well last night. Intermittent cath with coude this morning under aseptic technique. Slight bleeding noted in urethra. Pls refer I/O in flowsheet. Safety maintained

## 2021-10-02 NOTE — Progress Notes (Signed)
PROGRESS NOTE   Subjective/Complaints: Air on KUB- Pam will discuss with nursing trying liquid diet until tomorrow He states he feels better this morning.  Denies pain  ROS: No CP, SOB, Abdominal pain, N/V/C, HA,vision changes, pain   Objective:   DG Abd 1 View  Result Date: 10/01/2021 CLINICAL DATA:  Nausea and vomiting EXAM: ABDOMEN - 1 VIEW COMPARISON:  None Available. FINDINGS: Scattered large and small bowel gas is noted. Stomach is distended with air. No free air is seen. No abnormal mass or abnormal calcifications are noted. No acute bony abnormality is seen. IMPRESSION: No acute abnormality noted. Electronically Signed   By: Inez Catalina M.D.   On: 10/01/2021 19:51   No results for input(s): "WBC", "HGB", "HCT", "PLT" in the last 72 hours.  No results for input(s): "NA", "K", "CL", "CO2", "GLUCOSE", "BUN", "CREATININE", "CALCIUM" in the last 72 hours.   Intake/Output Summary (Last 24 hours) at 10/02/2021 1001 Last data filed at 10/02/2021 0904 Gross per 24 hour  Intake 480 ml  Output 1200 ml  Net -720 ml        Physical Exam: Vital Signs Blood pressure 136/78, pulse 79, temperature 97.7 F (36.5 C), temperature source Oral, resp. rate 16, height 5\' 9"  (1.753 m), weight 82.9 kg, SpO2 99 %. Gen: no distress, normal appearing HEENT: oral mucosa pink and moist, NCAT Cardio: Reg rate Chest: normal effort, normal rate of breathing Abd: soft, non-distended Ext: no edema Psych: Pt's affect is a little flat. Pt is cooperative  Neuro:Speech clear. Keeps eyes turned to the right and able to move to left with some effort. Left lateral rectus weakness. Left central 7. Demonstrates almost a left inattention-improved. Follows commands. Altered Left finger to nose , decreased fine motor movement LLE. Strength 5/5 RUE and RLE and grossly 4 to 4+/5 LUE and LLE. Senses pain in all 4. No resting tone. Musculoskeletal: No joint  swelling or tenderness noted, ROM normal  Assessment/Plan: 1. Functional deficits which require 3+ hours per day of interdisciplinary therapy in a comprehensive inpatient rehab setting. Physiatrist is providing close team supervision and 24 hour management of active medical problems listed below. Physiatrist and rehab team continue to assess barriers to discharge/monitor patient progress toward functional and medical goals  Care Tool:  Bathing    Body parts bathed by patient: Right arm, Chest, Abdomen, Left arm, Front perineal area, Right upper leg, Left upper leg, Face, Right lower leg, Left lower leg, Buttocks   Body parts bathed by helper: Buttocks     Bathing assist Assist Level: Minimal Assistance - Patient > 75%     Upper Body Dressing/Undressing Upper body dressing   What is the patient wearing?: Pull over shirt    Upper body assist Assist Level: Supervision/Verbal cueing    Lower Body Dressing/Undressing Lower body dressing      What is the patient wearing?: Incontinence brief, Pants     Lower body assist Assist for lower body dressing: Minimal Assistance - Patient > 75%     Toileting Toileting    Toileting assist Assist for toileting: Minimal Assistance - Patient > 75%     Transfers Chair/bed transfer  Transfers assist  Chair/bed transfer assist level: Minimal Assistance - Patient > 75%     Locomotion Ambulation   Ambulation assist      Assist level: Moderate Assistance - Patient 50 - 74% Assistive device: Walker-rolling Max distance: 180 feet   Walk 10 feet activity   Assist     Assist level: Moderate Assistance - Patient - 50 - 74% Assistive device: Walker-rolling   Walk 50 feet activity   Assist    Assist level: Moderate Assistance - Patient - 50 - 74% Assistive device: Walker-rolling    Walk 150 feet activity   Assist Walk 150 feet activity did not occur: Safety/medical concerns  Assist level: Moderate Assistance -  Patient - 50 - 74% Assistive device: Walker-rolling    Walk 10 feet on uneven surface  activity   Assist     Assist level: Maximal Assistance - Patient 25 - 49% Assistive device: Hand held assist, Other (comment) (handrail)   Wheelchair     Assist Is the patient using a wheelchair?: Yes Type of Wheelchair: Manual    Wheelchair assist level: Supervision/Verbal cueing Max wheelchair distance: 314 feet    Wheelchair 50 feet with 2 turns activity    Assist        Assist Level: Supervision/Verbal cueing   Wheelchair 150 feet activity     Assist      Assist Level: Supervision/Verbal cueing   Blood pressure 136/78, pulse 79, temperature 97.7 F (36.5 C), temperature source Oral, resp. rate 16, height 5\' 9"  (1.753 m), weight 82.9 kg, SpO2 99 %.  Medical Problem List and Plan: 1. 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  Impaired mobility: continue Lovenox             -antiplatelet therapy: DAPT X 3 months followed by ASA 325 mg daily 3. Pain Management:  Tylenol prn.  4. Mood/Behavior/Sleep: LCSW to follow for evaluation and support.              --Trazodone prn for insomnia             -antipsychotic agents: N/A 5. Neuropsych/cognition: This patient is ?capable of making decisions on his own behalf. 6. Skin/Wound Care:  Routine pressure relief measures.  7. Fluids/Electrolytes/Nutrition: Monitor I/O. Check CMET in am. 8. HTN: Monitor BP TID--avoid hypotension with Right M2 occlusion.             --continue Cozaar, Coreg and HCTZ  --Will decrease HCTZ to 12.5mg  due to AKI  -7/31 will increase HCTZ back to 25mg  daily  -8/11 last BP 106/73, he is intermittently high but will hold off on increasing  antihypertensives for now to avoid hypotension, continue to monitor  -8/15 reduce HCTZ back to 12.5 due to frequent urination, start flomax 0.4mg   Intermittently elevated, continue to monitor,consider increase Cozzar if remains above goal Vitals:   10/01/21 1928 10/02/21 0442  BP: 128/78 136/78  Pulse: 86 79  Resp: 16 16  Temp: 98 F (36.7 C) 97.7 F (36.5 C)  SpO2: 100% 99%    9. T2DM: Hgb A1c- 8.1 (down from 12). Will continue to monitor BS ac/hs  continue Glipizide and metformin  -Well controlled, continue to monitor for now  -8/1 DC glipizide CBG (last 3)  Recent Labs    10/01/21 1647 10/01/21 2105 10/02/21 0610  GLUCAP 167* 125* 91  -continues to be well controlled   10. AKI  -Will decrease HCTZ to 12.5mg   -NS 50 ml/hr startedd 7/28, Cr reviewed and has normalized  -Will DC fluids 7/31, recheck labs on Wednesday 8/2  -Scr 1.09  8/15, encourage PO intake to prevent repeat AKI, HCTZ decreased back to 12.5mg  11. HLD  -continue statin, heart healthy diet 12. Urinary incontinence  8/5- will order Timed voiding- q2 hours since cannot tell when going/needs to go or has any control- and cannot feel when wet  8/9 continence improving, continue timed voids  8/11 He had 1 dose oxybutynin yesterday, seem likely he had benefit and had continent episode, will increase oxybutynin to 5mg  TID, monitor PVR  8/15 Urinary retention last night and required IC, will decrease HCTZ and start Flomax  8/16 Urinary retention requiring IC, may be related to oxybutynin, will decrease to 2.5mg  BID, will ask to stand pt up when attempting to void 13. Constipation  -improved overall, senokot daily, PRN miralax 14. Anemia, mild  -HGB stable at 12.0 8/15  -Will check occult stool- I do not see result- will message nursing to obtain this 15. Air in abdomen: switch to liquid diet until tomorrow    LOS:  21 days A FACE TO FACE EVALUATION WAS PERFORMED  Alasia Enge P Larissa Pegg 10/02/2021,  10:01 AM

## 2021-10-02 NOTE — Progress Notes (Signed)
Occupational Therapy Note  Patient Details  Name: Shawn Austin MRN: [id number] Date of Birth: 07-25-1965  Today's Date: 10/02/2021 OT Missed Time: 75 Minutes Missed Time Reason: Patient ill (comment);Patient fatigue (nausea)  Pt nauseous and fatigued after PT session and declined to participate with OT. OT will follow up per plan of care.     Merlene Laughter Ryker [place] 10/02/2021, 2:46 PM

## 2021-10-02 NOTE — Plan of Care (Signed)
Goals downgrade to severe L inattention   Problem: RH Balance Goal: LTG Patient will maintain dynamic standing balance (PT) Description: LTG:  Patient will maintain dynamic standing balance with assistance during mobility activities (PT) Flowsheets (Taken 10/02/2021 0807) LTG: Pt will maintain dynamic standing balance during mobility activities with:: (downgrade to severe L inattention) Contact Guard/Touching assist Note: downgrade to severe L inattention   Problem: Sit to Stand Goal: LTG:  Patient will perform sit to stand with assistance level (PT) Description: LTG:  Patient will perform sit to stand with assistance level (PT) Flowsheets (Taken 10/02/2021 0807) LTG: PT will perform sit to stand in preparation for functional mobility with assistance level: (downgrade to severe L inattention) Contact Guard/Touching assist Note: downgrade to severe L inattention   Problem: RH Bed to Chair Transfers Goal: LTG Patient will perform bed/chair transfers w/assist (PT) Description: LTG: Patient will perform bed to chair transfers with assistance (PT). Flowsheets (Taken 10/02/2021 0807) LTG: Pt will perform Bed to Chair Transfers with assistance level: (downgrade to severe L inattention) Minimal Assistance - Patient > 75% Note: downgrade to severe L inattention   Problem: RH Car Transfers Goal: LTG Patient will perform car transfers with assist (PT) Description: LTG: Patient will perform car transfers with assistance (PT). Flowsheets (Taken 10/02/2021 0807) LTG: Pt will perform car transfers with assist:: (downgrade to severe L inattention) Minimal Assistance - Patient > 75% Note: downgrade to severe L inattention   Problem: RH Ambulation Goal: LTG Patient will ambulate in controlled environment (PT) Description: LTG: Patient will ambulate in a controlled environment, # of feet with assistance (PT). Flowsheets (Taken 10/02/2021 0807) LTG: Pt will ambulate in controlled environ  assist needed::  (downgrade to severe L inattention) Minimal Assistance - Patient > 75% LTG: Ambulation distance in controlled environment: 50 ft with LRAD Note: downgrade to severe L inattention Goal: LTG Patient will ambulate in home environment (PT) Description: LTG: Patient will ambulate in home environment, # of feet with assistance (PT). Flowsheets (Taken 10/02/2021 0807) LTG: Pt will ambulate in home environ  assist needed:: (downgrade to severe L inattention) Minimal Assistance - Patient > 75% LTG: Ambulation distance in home environment: 25 feet with LRAD Note: downgrade to severe L inattention   Problem: RH Wheelchair Mobility Goal: LTG Patient will propel w/c in controlled environment (PT) Description: LTG: Patient will propel wheelchair in controlled environment, # of feet with assist (PT) Flowsheets (Taken 10/02/2021 0807) LTG: Pt will propel w/c in controlled environ  assist needed:: Supervision/Verbal cueing LTG: Propel w/c distance in controlled environment: 100 ft Goal: LTG Patient will propel w/c in home environment (PT) Description: LTG: Patient will propel wheelchair in home environment, # of feet with assistance (PT). Flowsheets (Taken 10/02/2021 0807) LTG: Pt will propel w/c in home environ  assist needed:: Supervision/Verbal cueing Distance: wheelchair distance in controlled environment: 100 LTG: Propel w/c distance in home environment: 50 ft with LRAD   Problem: RH Stairs Goal: LTG Patient will ambulate up and down stairs w/assist (PT) Description: LTG: Patient will ambulate up and down # of stairs with assistance (PT) Flowsheets (Taken 10/02/2021 0807) LTG: Pt will ambulate up/down stairs assist needed:: (downgrade to severe L inattention) Minimal Assistance - Patient > 75% LTG: Pt will  ambulate up and down number of stairs: 8 steps Note: downgrade to severe L inattention

## 2021-10-03 ENCOUNTER — Inpatient Hospital Stay (HOSPITAL_COMMUNITY): Payer: Medicaid Other

## 2021-10-03 LAB — URINALYSIS, ROUTINE W REFLEX MICROSCOPIC
Bilirubin Urine: NEGATIVE
Glucose, UA: NEGATIVE mg/dL
Ketones, ur: 5 mg/dL — AB
Nitrite: NEGATIVE
Protein, ur: 100 mg/dL — AB
RBC / HPF: >50 RBC/hpf — ABNORMAL HIGH (ref 0–5)
Specific Gravity, Urine: 1.011 (ref 1.005–1.030)
WBC, UA: >50 WBC/hpf — ABNORMAL HIGH (ref 0–5)
pH: 5 (ref 5.0–8.0)

## 2021-10-03 LAB — CBC WITH DIFFERENTIAL/PLATELET
Abs Immature Granulocytes: 0.02 10*3/uL (ref 0.00–0.07)
Basophils Absolute: 0 10*3/uL (ref 0.0–0.1)
Basophils Relative: 1 %
Eosinophils Absolute: 0.1 10*3/uL (ref 0.0–0.5)
Eosinophils Relative: 2 %
HCT: 36.3 % — ABNORMAL LOW (ref 39.0–52.0)
Hemoglobin: 12.6 g/dL — ABNORMAL LOW (ref 13.0–17.0)
Immature Granulocytes: 0 %
Lymphocytes Relative: 29 %
Lymphs Abs: 1.6 10*3/uL (ref 0.7–4.0)
MCH: 28.1 pg (ref 26.0–34.0)
MCHC: 34.7 g/dL (ref 30.0–36.0)
MCV: 81 fL (ref 80.0–100.0)
Monocytes Absolute: 0.4 10*3/uL (ref 0.1–1.0)
Monocytes Relative: 8 %
Neutro Abs: 3.3 10*3/uL (ref 1.7–7.7)
Neutrophils Relative %: 60 %
Platelets: 493 10*3/uL — ABNORMAL HIGH (ref 150–400)
RBC: 4.48 MIL/uL (ref 4.22–5.81)
RDW: 12.3 % (ref 11.5–15.5)
WBC: 5.5 10*3/uL (ref 4.0–10.5)
nRBC: 0 % (ref 0.0–0.2)

## 2021-10-03 LAB — GLUCOSE, CAPILLARY
Glucose-Capillary: 65 mg/dL — ABNORMAL LOW (ref 70–99)
Glucose-Capillary: 65 mg/dL — ABNORMAL LOW (ref 70–99)
Glucose-Capillary: 79 mg/dL (ref 70–99)
Glucose-Capillary: 85 mg/dL (ref 70–99)
Glucose-Capillary: 92 mg/dL (ref 70–99)
Glucose-Capillary: 78 mg/dL (ref 70–99)
Glucose-Capillary: 106 mg/dL — ABNORMAL HIGH (ref 70–99)

## 2021-10-03 LAB — LACTIC ACID, PLASMA
Lactic Acid, Venous: 1.1 mmol/L (ref 0.5–1.9)
Lactic Acid, Venous: 1.2 mmol/L (ref 0.5–1.9)

## 2021-10-03 MED ORDER — GLUCOSE 4 G PO CHEW
CHEWABLE_TABLET | ORAL | Status: AC
  Administered 2021-10-03: 4 g
  Filled 2021-10-03: qty 1

## 2021-10-03 MED ORDER — ONDANSETRON HCL 4 MG/2ML IJ SOLN
4.0000 mg | Freq: Once | INTRAMUSCULAR | Status: AC
Start: 2021-10-03 — End: 2021-10-03
  Administered 2021-10-03: 4 mg via INTRAVENOUS
  Filled 2021-10-03: qty 2

## 2021-10-03 MED ORDER — METFORMIN HCL 500 MG PO TABS
500.0000 mg | ORAL_TABLET | Freq: Every day | ORAL | Status: DC
  Administered 2021-10-04 – 2021-10-05 (×2): 500 mg via ORAL
  Filled 2021-10-03 (×2): qty 1

## 2021-10-03 MED ORDER — GADOBUTROL 1 MMOL/ML IV SOLN
8.0000 mL | Freq: Once | INTRAVENOUS | Status: AC | PRN
Start: 2021-10-03 — End: 2021-10-03
  Administered 2021-10-03: 8 mL via INTRAVENOUS

## 2021-10-03 MED ORDER — ONDANSETRON HCL 40 MG/20ML IJ SOLN
8.0000 mg | Freq: Once | INTRAMUSCULAR | Status: DC
  Filled 2021-10-03: qty 4

## 2021-10-03 MED ORDER — GLUCOSE 40 % PO GEL
ORAL | Status: AC
  Filled 2021-10-03: qty 1.21

## 2021-10-03 MED ORDER — CHLORPROMAZINE HCL 25 MG PO TABS
12.5000 mg | ORAL_TABLET | Freq: Two times a day (BID) | ORAL | Status: DC
Start: 2021-10-03 — End: 2021-10-10
  Administered 2021-10-03 – 2021-10-10 (×14): 12.5 mg via ORAL
  Filled 2021-10-03 (×16): qty 1

## 2021-10-03 NOTE — Progress Notes (Signed)
PROGRESS NOTE   Subjective/Complaints: Seen ambulating with therapy in hallway today Head CT reviewed and stable, will order MRI as well to confirm no evolution of stroke  ROS: No CP, SOB, Abdominal pain, N/V/C, HA,vision changes, pain, nausea is improved   Objective:   DG Abd 2 Views  Result Date: 10/03/2021 CLINICAL DATA:  Aspiration pneumonia.  Nausea and vomiting. EXAM: ABDOMEN - 2 VIEW COMPARISON:  10/02/2021 FINDINGS: Normal sized heart. Clear lung bases. Normal bowel gas pattern without free peritoneal air. Thoracic and lumbar spine degenerative changes and mild scoliosis. IMPRESSION: No acute abnormality. Electronically Signed   By: Beckie Salts M.D.   On: 10/03/2021 11:32   DG Chest 2 View  Result Date: 10/03/2021 CLINICAL DATA:  Aspiration pneumonia EXAM: CHEST - 2 VIEW COMPARISON:  None Available. FINDINGS: Normal mediastinum and cardiac silhouette. Normal pulmonary vasculature. No evidence of effusion, infiltrate, or pneumothorax. No acute bony abnormality. IMPRESSION: No acute cardiopulmonary process. Electronically Signed   By: Genevive Bi M.D.   On: 10/03/2021 11:28   CT HEAD WO CONTRAST ( )  Result Date: 10/03/2021 CLINICAL DATA:  Stroke, follow up; worsening of nausea. History of medullary stroke EXAM: CT HEAD WITHOUT CONTRAST TECHNIQUE: Contiguous axial images were obtained from the base of the skull through the vertex without intravenous contrast. RADIATION DOSE REDUCTION: This exam was performed according to the departmental dose-optimization program which includes automated exposure control, adjustment of the mA and/or kV according to patient size and/or use of iterative reconstruction technique. COMPARISON:  Recent CT and MR imaging FINDINGS: Brain: Subtle low density at the right aspect of the medulla corresponding to the infarction on MRI. Small right frontoparietal cortical infarcts are not well seen by  CT. No acute intracranial hemorrhage. No new loss of gray-white differentiation. Chronic bilateral basal ganglia, thalamus, and adjacent white matter infarcts. No hydrocephalus or extra-axial collection. Vascular: No new finding. Skull: Calvarium is unremarkable. Sinuses/Orbits: No acute finding. Other: Mastoid air cells are clear. IMPRESSION: Recent small infarcts are better seen on the prior MRI. No acute intracranial hemorrhage or other acute abnormality. Electronically Signed   By: Guadlupe Spanish M.D.   On: 10/03/2021 08:33   DG Abd 1 View  Result Date: 10/02/2021 CLINICAL DATA:  NG tube placement EXAM: ABDOMEN - 1 VIEW COMPARISON:  None Available. FINDINGS: Enteric tube is within the stomach. Included lungs are clear. Visualized bowel gas pattern is unremarkable. IMPRESSION: Enteric tube is within stomach. Electronically Signed   By: Guadlupe Spanish M.D.   On: 10/02/2021 16:49   DG Abd 1 View  Result Date: 10/01/2021 CLINICAL DATA:  Nausea and vomiting EXAM: ABDOMEN - 1 VIEW COMPARISON:  None Available. FINDINGS: Scattered large and small bowel gas is noted. Stomach is distended with air. No free air is seen. No abnormal mass or abnormal calcifications are noted. No acute bony abnormality is seen. IMPRESSION: No acute abnormality noted. Electronically Signed   By: Alcide Clever M.D.   On: 10/01/2021 19:51   Recent Labs    10/02/21 1553  WBC 5.6  HGB 12.2*  HCT 35.2*  PLT 481*    Recent Labs    10/02/21 1553  NA 132*  K 4.2  CL 102  CO2 22  GLUCOSE 124*  BUN 13  CREATININE 1.01  CALCIUM 9.0     Intake/Output Summary (Last 24 hours) at 10/03/2021 1152 Last data filed at 10/03/2021 1130 Gross per 24 hour  Intake 297 ml  Output 1500 ml  Net -1203 ml        Physical Exam: Vital Signs Blood pressure (!) 142/79, pulse 93, temperature 97.8 F (36.6 C), temperature source Oral, resp. rate 16, height 5\' 9"  (1.753 m), weight 82.9 kg, SpO2 100 %. Gen: no distress, normal  appearing HEENT: oral mucosa pink and moist, NCAT Cardio: Reg rate Chest: normal effort, normal rate of breathing Abd: soft, non-distended Ext: no edema Psych: Pt's affect is a little flat. Pt is cooperative  Neuro:Speech clear. Keeps eyes turned to the right and able to move to left with some effort. Left lateral rectus weakness. Left central 7. Demonstrates almost a left inattention-improved. Follows commands. Altered Left finger to nose , decreased fine motor movement LLE. Strength 5/5 RUE and RLE and grossly 4 to 4+/5 LUE and LLE. Senses pain in all 4. No resting tone. Musculoskeletal: No joint swelling or tenderness noted, ROM normal  Assessment/Plan: 1. Functional deficits which require 3+ hours per day of interdisciplinary therapy in a comprehensive inpatient rehab setting. Physiatrist is providing close team supervision and 24 hour management of active medical problems listed below. Physiatrist and rehab team continue to assess barriers to discharge/monitor patient progress toward functional and medical goals  Care Tool:  Bathing    Body parts bathed by patient: Right arm, Chest, Abdomen, Left arm, Front perineal area, Right upper leg, Left upper leg, Face, Right lower leg, Left lower leg, Buttocks   Body parts bathed by helper: Buttocks     Bathing assist Assist Level: Minimal Assistance - Patient > 75%     Upper Body Dressing/Undressing Upper body dressing   What is the patient wearing?: Pull over shirt    Upper body assist Assist Level: Supervision/Verbal cueing    Lower Body Dressing/Undressing Lower body dressing      What is the patient wearing?: Incontinence brief, Pants     Lower body assist Assist for lower body dressing: Minimal Assistance - Patient > 75%     Toileting Toileting    Toileting assist Assist for toileting: Minimal Assistance - Patient > 75%     Transfers Chair/bed transfer  Transfers assist     Chair/bed transfer assist level:  Minimal Assistance - Patient > 75%     Locomotion Ambulation   Ambulation assist      Assist level: Minimal Assistance - Patient > 75% Assistive device: Walker-rolling Max distance: 180 feet   Walk 10 feet activity   Assist     Assist level: Minimal Assistance - Patient > 75% Assistive device: Walker-rolling   Walk 50 feet activity   Assist    Assist level: Minimal Assistance - Patient > 75% Assistive device: Walker-rolling    Walk 150 feet activity   Assist Walk 150 feet activity did not occur: Safety/medical concerns  Assist level: Minimal Assistance - Patient > 75% Assistive device: Walker-rolling    Walk 10 feet on uneven surface  activity   Assist     Assist level: Maximal Assistance - Patient 25 - 49% Assistive device: Hand held assist, Other (comment) (handrail)   Wheelchair     Assist Is the patient using a wheelchair?: Yes Type of Wheelchair: Manual    Wheelchair assist level: Supervision/Verbal  cueing Max wheelchair distance: 314 feet    Wheelchair 50 feet with 2 turns activity    Assist        Assist Level: Supervision/Verbal cueing   Wheelchair 150 feet activity     Assist      Assist Level: Supervision/Verbal cueing   Blood pressure (!) 142/79, pulse 93, temperature 97.8 F (36.6 C), temperature source Oral, resp. rate 16, height 5\' 9"  (1.753 m), weight 82.9 kg, SpO2 100 %.  Medical Problem List and Plan: 1. Functional deficits secondary to right MCA distribution infarct, likely embolic             -Patient may shower             -ELOS/Goals: 8/22, supervision- Min A  Continue  CIR- PT, OT and SLP  -Seen by Neruopsych, appreciate assistance  -Will ask if therapy can start a little later in AM if possible 2.  Impaired mobility: continue Lovenox             -antiplatelet therapy: DAPT X 3 months followed by ASA 325 mg daily 3. Pain Management:  Tylenol prn.  4. Mood/Behavior/Sleep: LCSW to follow for  evaluation and support.              --Trazodone prn for insomnia             -antipsychotic agents: N/A 5. Neuropsych/cognition: This patient is ?capable of making decisions on his own behalf. 6. Skin/Wound Care:  Routine pressure relief measures.  7. Hyponatremia: Na reviewed and is 132 on 8/17, repeat Monday 8. HTN: Monitor BP TID--avoid hypotension with Right M2 occlusion.             --continue Cozaar, Coreg and HCTZ  --Will decrease HCTZ to 12.5mg  due to AKI  -7/31 will increase HCTZ back to 25mg  daily  -8/11 last BP 106/73, he is intermittently high but will hold off on increasing antihypertensives for now to avoid hypotension, continue to monitor  -8/15 reduce HCTZ back to 12.5 due to frequent urination, start flomax 0.4mg   Intermittently elevated, continue to monitor,consider increase Cozzar if remains above goal Vitals:   10/03/21 0937 10/03/21 1018  BP: 119/74 (!) 142/79  Pulse: (!) 102 93  Resp:  16  Temp:  97.8 F (36.6 C)  SpO2:  100%    9. T2DM: Hgb A1c- 8.1 (down from 12). Will continue to monitor BS ac/hs             continue Glipizide and metformin  -Well controlled, continue to monitor for now  -8/1 DC glipizide CBG (last 3)  Recent Labs    10/03/21 0658 10/03/21 0804 10/03/21 1124  GLUCAP 79 85 92  Decrease metformin to 250mg  BID   10. AKI  -Will decrease HCTZ to 12.5mg   -NS 50 ml/hr startedd 7/28, Cr reviewed and has normalized  -Will DC fluids 7/31, recheck labs on Wednesday 8/2  -Scr 1.09  8/15, encourage PO intake to prevent repeat AKI, HCTZ decreased back to 12.5mg  11. HLD  -continue statin, heart healthy diet 12. Urinary incontinence  8/5- will order Timed voiding- q2 hours since cannot tell when going/needs to go or has any control- and cannot feel when wet  8/9 continence improving, continue timed voids  8/11 He had 1 dose oxybutynin yesterday, seem likely he had benefit and had continent episode, will increase oxybutynin to 5mg  TID, monitor  PVR  8/15 Urinary retention last night and required IC, will decrease HCTZ and start Flomax  8/16 Urinary retention requiring IC, may be related to oxybutynin, will decrease to 2.5mg  BID, will ask to stand pt up when attempting to void 13. Constipation  -improved overall, senokot daily, PRN miralax 14. Anemia, mild  -HGB stable at 12.0 8/15  -Stool occult reviewed and is negative 15. Gastroparesis: NGT ordered to decompress stomach. NPO. CT ordered to assess for stroke progression and is stable. Will order MRI as well to confirm.     LOS:  22 days A FACE TO FACE EVALUATION WAS PERFORMED  Clide Deutscher Violet [place] 10/03/2021, 11:52 AM

## 2021-10-03 NOTE — Progress Notes (Signed)
Patient intentionally pulled NG tube being used for gastric decompression. Provider notified. Not SOB, no symptoms of aspiration. To monitor and document any changes. Remains NPO with only sips with Medication which he refused. Safety maintained.

## 2021-10-03 NOTE — Progress Notes (Signed)
Occupational Therapy Session Note  Patient Details  Name: Shawn Austin MRN: [id number] Date of Birth: 1965/10/15  Today's Date: 10/03/2021 OT Individual Time: 1345-1415 OT Individual Time Calculation (min): 30 min    Short Term Goals: Week 2:  OT Short Term Goal 1 (Week 2): Pt will be able to perform toilet transfers consistently with min A or less. OT Short Term Goal 1 - Progress (Week 2): Met OT Short Term Goal 2 (Week 2): Pt will be able to hold standning balance with min A or less consistently with only min cues to shift weight to L when he leans to R. OT Short Term Goal 2 - Progress (Week 2): Met OT Short Term Goal 3 (Week 2): Pt will be able to hold a L eye gaze for at least 5 seconds demonstrating improved oculomotor control to scan his environment. OT Short Term Goal 3 - Progress (Week 2): Met  Skilled Therapeutic Interventions/Progress Updates:    Pt greeted semi-reclined in bed with significant other [name] present for family education. Pt denied any nausea at this time. He reported he may need to use the bathroom. OT educated Katina on L innattention, gaze preference and stabilization, motor planning deficits, shower transfers, and BADL modifications. Pt completed bed mobility with supervision. He then performed stand-pivot transfer to [facility] w/ RW and CGA. Pt unable to void bowel or bladder. OT had [place] practice sit<>stands from [facility] and stand-pivot back to bed with CGA/min A. Educated on use of gait belt, lateral LOB, and safety modifications. [name] felt more confident with dc plan after hands on training. Pt left semi-reclined in bed with bed alarm on, call bell in reach, and needs met.   Therapy Documentation Precautions:  Precautions Precautions: Fall Precaution Comments: R gaze preference, R lateral lean in sitting/standing Restrictions Weight Bearing Restrictions: No Vital Signs: Therapy Vitals Temp: 98.5 F (36.9 C) Temp Source: Oral Pulse Rate: 83 Resp: 16 BP:  131/77 Patient Position (if appropriate): Lying Oxygen Therapy SpO2: 100 % O2 Device: Room Air Pain:  Denies pain   Therapy/Group: Individual Therapy  Valma Cava 10/03/2021, 2:21 PM

## 2021-10-03 NOTE — Progress Notes (Signed)
Patient ID: Shawn Austin, male   DOB: February 01, 1966, 56 y.o.   MRN: [id number]  Met with pt and Katina-girlfriend who is here for education but pt is not feeling well today so not sure how much he can participate with pt. Have asked her to come back on Monday from 1;00-4:00 to do more education with pt hoping he will feel better then. She will be back on Monday for continued family education.

## 2021-10-03 NOTE — Evaluation (Signed)
Speech Language Pathology Bedside Swallow Evaluation   Patient Details  Name: Shawn Austin MRN: [id number] Date of Birth: [birth year]  SLP Diagnosis: Cognitive Impairments;Dysphagia  Rehab Potential: Excellent ELOS: 8/22   Today's Date: 10/03/2021 SLP Individual Time: 9450-3888 SLP Individual Time Calculation (min): 70 min  Hospital Problem: Principal Problem:   Acute stroke of medulla oblongata ([place]) Active Problems:   Diabetes mellitus ([place])   Primary hypertension   Urinary incontinence  Past Medical History:  Past Medical History:  Diagnosis Date   Anemia    Articular cartilage disorder of hip    Cerebral ischemia    Congenital deformity of right hip joint    Degeneration of cervical intervertebral disc    Diabetes mellitus without complication ([place])    type 2   Fatigue    Hyperglycemia due to diabetes mellitus (HCC)    Hyperlipidemia    Hypertension    Hypocalcemia    Hyponatremia    Lower limb pain, inferior, right    Mild cognitive disorder    Pain in joint of left knee    Recurrent falls    Spondylolisthesis of lumbar region    Spondylolisthesis, lumbar region    Past Surgical History:  Past Surgical History:  Procedure Laterality Date   APPENDECTOMY  1970   BUBBLE STUDY  09/11/2021   Procedure: BUBBLE STUDY;  Surgeon: Josue Hector, MD;  Location: [facility];  Service: Cardiovascular;;   TEE WITHOUT CARDIOVERSION N/A 09/11/2021   Procedure: TRANSESOPHAGEAL ECHOCARDIOGRAM (TEE);  Surgeon: Josue Hector, MD;  Location: [facility];  Service: Cardiovascular;  Laterality: N/A;   HPI:  Shawn Austin is a 56 year old RH-male with history of HTN, T2DM, DDD who was admitted on 09/01/21 with reports of leaning to left with falls, slurring of speech as well as eye deviation to right for a few days. CTA head/neck showed diminutive L-ACA with concerns of occlusion, moderate stenosis of distal left M1 and proximal right M2 segment . MRI brain done showing  small acute ischemia in dorsal lateral right medulla oblongata with chronic ischemic microangiopathy. While awaiting rehab bed, patient developed perseveration with apraxia LUE, worsening of balance as well as visual deficits on 07/25. MRI brain repeated and showed few new interval/acute infarcts in right perirolandic frontoparietal region with mild increase in size of right medullary infract and no change in edema. Therapy has been working with patient who continues to be limited by weakness, ataxia, apraxia, decreased in safety awareness as well as balance deficits. CIR recommended due to functional decline.   Assessment / Plan / Recommendation Clinical Impression SLP was consulted for clinical swallow evaluation secondary to report of difficulty swallowing morning medications (attempted to take several at a time). Pt has been limited by progressive issues with nausea and emesis for the past 2-3 days. Pt was made NPO as of 8/17 @ 1531 due to GI concerns; NGT ordered to decompress stomach. Per PA, symptoms likely GI in nature from gastric distension. CT head was ordered to rule out progression of stroke. Pt also awaiting MRI. SLP received verbal clearance to assess swallow at bedside.  Pt was accompanied by his partner, Shawn Austin who reported pt exhibits periods of nausea and emesis at home and this is not uncommon for him. Pt/Katina denied pt having symptoms assessed by MD or GI specialist.   Pt exhibited timely and effective mastication of regular solids with trace oral residuals noted. Pt exhibited delayed throat clear with sequential thin liquid straw sips, and  no pharyngeal symptoms with cup sips. Pt exhibited adequate oral containment, what appeared to be timely swallow response, and no evidence of oral holding or pocketing. At this time, do not suspect symptoms correlate to oropharyngeal swallow function are are likely GI in nature. From a swallowing standpoint, pt appears appropriate to progress to dys  3/regular textures as tolerated per clearance from MD/PA. Recommend pt take only one medication at a time with liquid or in applesauce/pudding. Prefer cup sips due to delayed throat clear with straw x1. Aspiration risk appears mild. Reviewed aspiration & reflux precautions including keeping HOB elevated during and 30-60 minutes following meal; small bites/sips, and slow rate of consumption. Discontinue PO intake with nausea or any intolerance.   Per discussion with PA following swallow assessment, plan is to initiate full liquid diet and diet will be progressed as tolerated per MD discretion.  Further follow up does not appear clinically indicated. Please re-consult if change in status.   Skilled Therapeutic Interventions          Clinical swallow evaluation   SLP Assessment  Patient will need skilled [facility] during CIR admission    Recommendations  SLP Diet Recommendations: Other (Comment) (Per PA, initiate full liquid diet at this time. From a swallowing perspective, pt Shawn progress pt to regular textures with thin liquids as tolerated as directed by PA/MD) Medication Administration: Whole meds with liquid (one at a time) Supervision: Patient able to self feed;Full supervision/cueing for compensatory strategies (initial full supervision recommended due to hx of recent emesis) Compensations: Slow rate;Small sips/bites Postural Changes and/or Swallow Maneuvers: Upright 30-60 min after meal;Seated upright 90 degrees Oral Care Recommendations: Oral care BID Patient destination: Home Follow up Recommendations: 24 hour supervision/assistance;Home Health SLP;Outpatient SLP Equipment Recommended: None recommended by SLP    SLP Frequency 3 to 5 out of 7 days   SLP Duration  SLP Intensity  SLP Treatment/Interventions 8/22  Minumum of 1-2 x/day, 30 to 90 minutes  Dysphagia/aspiration precaution training    Care Tool Care Tool Cognition Ability to hear (with  hearing aid or hearing appliances if normally used Ability to hear (with hearing aid or hearing appliances if normally used): 0. Adequate - no difficulty in normal conservation, social interaction, listening to TV   Expression of Ideas and Wants Expression of Ideas and Wants: 3. Some difficulty - exhibits some difficulty with expressing needs and ideas (e.g, some words or finishing thoughts) or speech is not clear   Understanding Verbal and Non-Verbal Content Understanding Verbal and Non-Verbal Content: 3. Usually understands - understands most conversations, but misses some part/intent of message. Requires cues at times to understand  Memory/Recall Ability     Bedside Swallowing Assessment General Date of Onset: 09/01/21 Previous Swallow Assessment: 09/02/2021 Diet Prior to this Study: NPO (previous to NPO, pt was consuming regular textures and thin liquids) Temperature Spikes Noted: No Respiratory Status: Room air History of Recent Intubation: No Behavior/Cognition: Alert Oral Cavity - Dentition: Adequate natural dentition Self-Feeding Abilities: Able to feed self;Needs set up Vision: Functional for self-feeding Patient Positioning: Upright in bed Baseline Vocal Quality: Hoarse Volitional Cough: Strong Volitional Swallow: Able to elicit  Oral Care Assessment Oral Assessment  (WDL): Exceptions to WDL Lips: Asymmetrical Teeth: Missing (Comment) Tongue: Pink;Moist Mucous Membrane(s): Pink;Moist Saliva: Moist, saliva free flowing Level of Consciousness: Alert Is patient on any of following O2 devices?: None of the above Nutritional status: Fluids only or NPO for >24 hours Oral Assessment Risk : Low Risk Ice  Chips Ice chips: Not tested Thin Liquid Thin Liquid: Impaired Presentation: Spoon;Cup Pharyngeal  Phase Impairments: Throat Clearing - Delayed Nectar Thick Nectar Thick Liquid: Not tested Honey Thick Honey Thick Liquid: Not tested Puree Puree: Within functional  limits Presentation: Self Fed Solid Solid: Within functional limits BSE Assessment Risk for Aspiration Impact on safety and function: Mild aspiration risk Other Related Risk Factors: Previous CVA;Cognitive impairment  Short Term Goals: Week 3: SLP Short Term Goal 1 (Week 3): STG=LTG due to ELOS  Refer to Care Plan for Long Term Goals  Recommendations for other services: None   Discharge Criteria: Patient will be discharged from SLP if patient refuses treatment 3 consecutive times without medical reason, if treatment goals not met, if there is a change in medical status, if patient makes no progress towards goals or if patient is discharged from hospital.  The above assessment, treatment plan, treatment alternatives and goals were discussed and mutually agreed upon: by patient and by family  Patty Sermons 10/03/2021, 4:33 PM

## 2021-10-03 NOTE — Progress Notes (Signed)
Physical Therapy Weekly Progress Note  Patient Details  Name: Shawn Austin MRN: [id number] Date of Birth: [birth year]  Beginning of progress report period: September 12, 2021 End of progress report period: October 03, 2021  Patient has met  6 of 9 short term goals. Pt making limited progress and long term goals downgraded due to significant left inattention and right gaze preference. Pt limited due to nausea the past few days and has been unable to participate in sessions. Pt largely requires CGA/min A with transfers, min/mod A with gait, and min A with stairs with 2 HR's. Pt requires frequent cueing due to inattention and decreased safety/judgement. However, pt is motivated during PT sessions and requires decreased assistance compared to evaluation. Patient's S/O scheduled for family education Monday afternoon.   Patient continues to demonstrate the following deficits muscle weakness, decreased cardiorespiratoy endurance, impaired timing and sequencing, unbalanced muscle activation, ataxia, decreased coordination, and decreased motor planning, decreased visual acuity, decreased visual perceptual skills, and decreased visual motor skills, decreased midline orientation, decreased attention to left, and decreased motor planning, decreased initiation, decreased attention, decreased awareness, decreased problem solving, decreased safety awareness, decreased memory, and delayed processing, central origin, and decreased sitting balance, decreased standing balance, decreased postural control, and decreased balance strategies and therefore will continue to benefit from skilled PT intervention to increase functional independence with mobility.  Patient progressing toward long term goals..  Continue plan of care.  PT Short Term Goals Week 1:  PT Short Term Goal 1 (Week 1): Pt will perform sit to stand with LRAD and min A PT Short Term Goal 1 - Progress (Week 1): Met PT Short Term Goal 2 (Week 1): Pt will perform  stand pivot transfer with LRAD and mod A PT Short Term Goal 2 - Progress (Week 1): Met PT Short Term Goal 3 (Week 1): Pt will ambulate 50 ft with LRAD and mod A PT Short Term Goal 3 - Progress (Week 1): Progressing toward goal Week 2:  PT Short Term Goal 1 (Week 2): Pt will perform sit to stand with LRAD and CGA PT Short Term Goal 1 - Progress (Week 2): Met PT Short Term Goal 2 (Week 2): Pt will perform stand pivot transfer with LRAD and min A PT Short Term Goal 2 - Progress (Week 2): Met PT Short Term Goal 3 (Week 2): Pt will ambulate 25 feet with mod A and LRAD PT Short Term Goal 3 - Progress (Week 2): Met PT Short Term Goal 5 (Week 2): Pt will perform sit to stand with SBA PT Short Term Goal 5 - Progress (Week 2): Not met Week 3:  PT Short Term Goal 1 (Week 3): Pt will perform stand pivot transfer with CGA PT Short Term Goal 1 - Progress (Week 3): Not met PT Short Term Goal 2 (Week 3): Pt will ambulate 25 ft with min A PT Short Term Goal 2 - Progress (Week 3): Met Week 4:  PT Short Term Goal 1 (Week 4): STG=LTG  Skilled Therapeutic Interventions/Progress Updates:  Ambulation/gait training;Discharge planning;DME/adaptive equipment instruction;Functional mobility training;Psychosocial support;Therapeutic Activities;UE/LE Strength taining/ROM;Visual/perceptual remediation/compensation;Balance/vestibular training;Community reintegration;Disease management/prevention;Neuromuscular re-education;Patient/family education;Stair training;Therapeutic Exercise;UE/LE Coordination activities;Wheelchair propulsion/positioning   Therapy Documentation Precautions:  Precautions Precautions: Fall Precaution Comments: R gaze preference, R lateral lean in sitting/standing Restrictions Weight Bearing Restrictions: No    Therapy/Group: Individual Therapy  Verl Dicker Verl Dicker PT, DPT  10/03/2021, 7:50 AM

## 2021-10-03 NOTE — Progress Notes (Signed)
Physical Therapy Session Note  Patient Details  Name: Shawn Austin MRN: [id number] Date of Birth: Nov 26, 1965  Today's Date: 10/03/2021 PT Individual Time: 4098-1191 PT Individual Time Calculation (min): 64 min    Today's Date: 10/03/2021 PT Missed Time: 89 Minutes Missed Time Reason: Patient unwilling to participate;Patient ill (Comment)  Short Term Goals: Week 1:  PT Short Term Goal 1 (Week 1): Pt will perform sit to stand with LRAD and min A PT Short Term Goal 1 - Progress (Week 1): Met PT Short Term Goal 2 (Week 1): Pt will perform stand pivot transfer with LRAD and mod A PT Short Term Goal 2 - Progress (Week 1): Met PT Short Term Goal 3 (Week 1): Pt will ambulate 50 ft with LRAD and mod A PT Short Term Goal 3 - Progress (Week 1): Progressing toward goal Week 2:  PT Short Term Goal 1 (Week 2): Pt will perform sit to stand with LRAD and CGA PT Short Term Goal 1 - Progress (Week 2): Met PT Short Term Goal 2 (Week 2): Pt will perform stand pivot transfer with LRAD and min A PT Short Term Goal 2 - Progress (Week 2): Met PT Short Term Goal 3 (Week 2): Pt will ambulate 25 feet with mod A and LRAD PT Short Term Goal 3 - Progress (Week 2): Met PT Short Term Goal 5 (Week 2): Pt will perform sit to stand with SBA Week 3:  PT Short Term Goal 1 (Week 3): Pt will perform stand pivot transfer with CGA PT Short Term Goal 2 (Week 3): Pt will ambulate 25 ft with min A  Skilled Therapeutic Interventions/Progress Updates:      Therapy Documentation Precautions:  Precautions Precautions: Fall Precaution Comments: R gaze preference, R lateral lean in sitting/standing Restrictions Weight Bearing Restrictions: No  Treatment Session 1:  Pt received semi-reclined in bed and agreeable to PT session. Pt ambulated ~20 ft to toilet and unable to void. Pt transported in w/c to dayroom for time management and energy conservation. Pt participated in blocked practice of sit to stand on incline surface  to serve as tactile cue to weight shift anteriorly. Nursing provided patient with medications and PA rounded with patient. Pt ambulated ~160 ft with RW and primarily required min A and mod A for last 20 ft due to fatigue as pt presented with increased right side lean. Additional assist positioned w/c behind pt and he required mod A for stand to sit. Pt with episode of projectile emesis immediately after stand to sit transfer and returned to room and positioned semi-reclined in bed. Pt left in care with nursing with all needs in reach.    Treatment Session 2:  Pt received semi-reclined in bed with S/O present. Pt declined PT and missed 45 minutes of skilled therapy.   Therapy/Group: Individual Therapy  Verl Dicker Verl Dicker PT, DPT  10/03/2021, 7:35 AM

## 2021-10-03 NOTE — Progress Notes (Signed)
Hypoglycemic Event  CBG: 65  Treatment: [facility] 4 oz juice/soda after 4 gram glucose pill per MD order.  Symptoms: None  Follow-up CBG: Time:6:32 CBG Result:79  Possible Reasons for Event: Other:   NPO for gastric decompression. Allowed Sips of water with medications.  Comments/MD notified: Dois Davenport (PA)    Craig Guess

## 2021-10-04 DIAGNOSIS — R7989 Other specified abnormal findings of blood chemistry: Secondary | ICD-10-CM

## 2021-10-04 DIAGNOSIS — K3184 Gastroparesis: Secondary | ICD-10-CM

## 2021-10-04 LAB — GLUCOSE, CAPILLARY
Glucose-Capillary: 83 mg/dL (ref 70–99)
Glucose-Capillary: 129 mg/dL — ABNORMAL HIGH (ref 70–99)
Glucose-Capillary: 111 mg/dL — ABNORMAL HIGH (ref 70–99)
Glucose-Capillary: 121 mg/dL — ABNORMAL HIGH (ref 70–99)

## 2021-10-04 NOTE — Progress Notes (Signed)
Occupational Therapy Session Note  Patient Details  Name: Shawn Austin MRN: [id number] Date of Birth: 10-08-1965  Today's Date: 10/04/2021 OT Individual Time:1015-1115 OT Individual Time Calculation (min): 60 min    Short Term Goals: Week 3:  OT Short Term Goal 1 (Week 3): Pt will complete toilet transfers with CGA OT Short Term Goal 1 - Progress (Week 3): Progressing toward goal OT Short Term Goal 2 (Week 3): Pt will require no more than min cueing to maintain midline orientation during ambulatory transfer to the bathroom OT Short Term Goal 2 - Progress (Week 3): Progressing toward goal OT Short Term Goal 3 (Week 3): Pt will complete LB ADLs with (S) overall OT Short Term Goal 3 - Progress (Week 3): Progressing toward goal  Skilled Therapeutic Interventions/Progress Updates: patient supine in bed and stated he had just woken up from a nap after having been very nauseated earlier.  Patient concurred to participate as follows:  Supine to EOB = Mod I ;     sitting balance EOB = Min A initiallyto max assistance to maintain against right lateral pushing.   The longer the patient sat and fatigued, the more often and stronger were his right lateral leans.            EOB to w/c transfer x 2 to patient's left = Moderate assistance and maximal verbal techniqes for safety during transition and not sitting until he could feel chair against his legs and to reach back before sitting.                            Once in chair patient complained of "tight in my neck" and initiated "stretching it out" by stretching laterally both directions and neutral to chin.   This clinician assisted patient with stretching right trapezius muscles, bilaterally.   He denied offer for hot pack.      Patient initially concurred to practice toilet transfer but later declined and stated he preferred standing balance and neck rotation/stretching.     Patient participated in static standing balance activities with min to  moderate assistance and supporting bilateral hands on surfaces.   The longer her stood, the more he leaned right laterally.   Mirror in front was helpful for returning to midline orientation; however, he required cues for awareness and focus to look at himself and orientation in mirror.  Patient was seated in w/c with safety alarm engaged and demonstrated accurate use of call bell.    His phone was placed on his lap with reminders that it was there.  Patient tolerated session well in spite of stating he has had much nausea.  Continue patient's plan of care.     Therapy Documentation Precautions:  Precautions Precautions: Fall Precaution Comments: R gaze preference, R lateral lean in sitting/standing Restrictions Weight Bearing Restrictions: No     Therapy/Group: Individual Therapy  [name] [facility] 10/04/2021, 4:14 PM

## 2021-10-04 NOTE — Progress Notes (Signed)
Speech Language Pathology Daily Session Note  Patient Details  Name: Shawn Austin MRN: [id number] Date of Birth: June 14, 1965  Today's Date: 10/04/2021 SLP Individual Time: 0901-0930 SLP Individual Time Calculation (min): 29 min  Short Term Goals: Week 3: SLP Short Term Goal 1 (Week 3): STG=LTG due to ELOS  Skilled Therapeutic Interventions: Pt seen for skilled ST with focus on cognitive goals, pt in bed sleeping soundly and requiring multimodal cues and extra time to rouse for therapeutic tasks. Pt denies nausea this AM, reports sleeping well the night before and feeling well, however is significantly limited by fatigue and lethargy throughout tx session. SLP facilitating simple, functional recall activity by providing extra time and overall mod A cues for attention to task to increase accuracy. Pt requires mod-max A cues to recall discharge date, recent ST tx sessions and goals of ST due to decreased sustained attention to stimuli. Pt left in bed with alarm set and all needs met, cont ST POC.   Pain Pain Assessment Pain Scale: 0-10 Pain Score: 0-No pain  Therapy/Group: Individual Therapy  Dewaine Conger 10/04/2021, 9:20 AM

## 2021-10-04 NOTE — Progress Notes (Signed)
Patient refusing a large portion of meals and oral intake during day shift. Encouraged dinner and accepted.

## 2021-10-04 NOTE — Progress Notes (Addendum)
PROGRESS NOTE   Subjective/Complaints: Pt lying in bed. Says he was able to sleep. Says his stomach feels better. Denied nausea. Was contacted this morning about MRI results. Nursing reports no changes in his clinical appearance.   ROS: Limited due to cognitive/behavioral    Objective:   MR BRAIN W WO CONTRAST  Result Date: 10/04/2021 CLINICAL DATA:  Stroke suspected, slurring speech, eye deviation to the right EXAM: MRI HEAD WITHOUT AND WITH CONTRAST TECHNIQUE: Multiplanar, multiecho pulse sequences of the brain and surrounding structures were obtained without and with intravenous contrast. CONTRAST:  90mL GADAVIST GADOBUTROL 1 MMOL/ML IV SOLN COMPARISON:  09/09/2021 FINDINGS: Brain: Restricted diffusion in the medial right frontal lobe white matter (series 5, image 80), which is linear and measuring approximately 9 mm. The anterior portion of this lesion demonstrates an ADC correlate (series 6, image 31), while the more posterior portion of this lesion has a less convincing ADC correlate, possibly a combination of acute and subacute infarct. This is new from the 09/09/2021 exam. A small focus of restricted diffusion is also noted again in the dorsal lateral right medulla (series 5, image 54), without definite ADC correlate and decreased in size compared to prior exam. No acute hemorrhage, mass, mass effect, or midline shift. No hydrocephalus or extra-axial collection. Redemonstrated hemosiderin deposition in the right caudate head. T2 hyperintense signal in the periventricular white matter, likely the sequela of chronic small vessel ischemic disease. Remote lacunar infarcts in the bilateral basal ganglia Vascular: Normal arterial flow voids. Skull and upper cervical spine: Normal marrow signal. Sinuses/Orbits: Mucous retention cysts and mucosal thickening in the ethmoid air cells. Mucous retention cyst in the left maxillary sinus. Mucosal  thickening in the right maxillary sinus. Other: The mastoids are well aerated. IMPRESSION: 1. Acute and also likely subacute infarct in the medial right frontal lobe white matter. 2. Restricted diffusion is again noted in the dorsal lateral right medulla, without definite ADC correlate, possibly evolution of the prior infarct versus additional subacute infarct. These results will be called to the ordering clinician or representative by the Radiologist Assistant, and communication documented in the PACS or [employer]. Electronically Signed   By: Wiliam Ke M.D.   On: 10/04/2021 03:44   DG Abd 2 Views  Result Date: 10/03/2021 CLINICAL DATA:  Aspiration pneumonia.  Nausea and vomiting. EXAM: ABDOMEN - 2 VIEW COMPARISON:  10/02/2021 FINDINGS: Normal sized heart. Clear lung bases. Normal bowel gas pattern without free peritoneal air. Thoracic and lumbar spine degenerative changes and mild scoliosis. IMPRESSION: No acute abnormality. Electronically Signed   By: Beckie Salts M.D.   On: 10/03/2021 11:32   DG Chest 2 View  Result Date: 10/03/2021 CLINICAL DATA:  Aspiration pneumonia EXAM: CHEST - 2 VIEW COMPARISON:  None Available. FINDINGS: Normal mediastinum and cardiac silhouette. Normal pulmonary vasculature. No evidence of effusion, infiltrate, or pneumothorax. No acute bony abnormality. IMPRESSION: No acute cardiopulmonary process. Electronically Signed   By: Genevive Bi M.D.   On: 10/03/2021 11:28   CT HEAD WO CONTRAST ( )  Result Date: 10/03/2021 CLINICAL DATA:  Stroke, follow up; worsening of nausea. History of medullary stroke EXAM: CT HEAD WITHOUT CONTRAST TECHNIQUE: Contiguous axial  images were obtained from the base of the skull through the vertex without intravenous contrast. RADIATION DOSE REDUCTION: This exam was performed according to the departmental dose-optimization program which includes automated exposure control, adjustment of the mA and/or kV according to patient size  and/or use of iterative reconstruction technique. COMPARISON:  Recent CT and MR imaging FINDINGS: Brain: Subtle low density at the right aspect of the medulla corresponding to the infarction on MRI. Small right frontoparietal cortical infarcts are not well seen by CT. No acute intracranial hemorrhage. No new loss of gray-white differentiation. Chronic bilateral basal ganglia, thalamus, and adjacent white matter infarcts. No hydrocephalus or extra-axial collection. Vascular: No new finding. Skull: Calvarium is unremarkable. Sinuses/Orbits: No acute finding. Other: Mastoid air cells are clear. IMPRESSION: Recent small infarcts are better seen on the prior MRI. No acute intracranial hemorrhage or other acute abnormality. Electronically Signed   By: Guadlupe Spanish M.D.   On: 10/03/2021 08:33   DG Abd 1 View  Result Date: 10/02/2021 CLINICAL DATA:  NG tube placement EXAM: ABDOMEN - 1 VIEW COMPARISON:  None Available. FINDINGS: Enteric tube is within the stomach. Included lungs are clear. Visualized bowel gas pattern is unremarkable. IMPRESSION: Enteric tube is within stomach. Electronically Signed   By: Guadlupe Spanish M.D.   On: 10/02/2021 16:49   Recent Labs    10/02/21 1553 10/03/21 1202  WBC 5.6 5.5  HGB 12.2* 12.6*  HCT 35.2* 36.3*  PLT 481* 493*    Recent Labs    10/02/21 1553  NA 132*  K 4.2  CL 102  CO2 22  GLUCOSE 124*  BUN 13  CREATININE 1.01  CALCIUM 9.0     Intake/Output Summary (Last 24 hours) at 10/04/2021 1023 Last data filed at 10/04/2021 0856 Gross per 24 hour  Intake 237 ml  Output 700 ml  Net -463 ml        Physical Exam: Vital Signs Blood pressure 111/83, pulse 89, temperature 97.9 F (36.6 C), temperature source Oral, resp. rate 18, height 5\' 9"  (1.753 m), weight 82.9 kg, SpO2 100 %. Constitutional: No distress . Vital signs reviewed. HEENT: NCAT, EOMI, oral membranes moist Neck: supple Cardiovascular: RRR without murmur. No JVD    Respiratory/Chest: CTA  Bilaterally without wheezes or rales. Normal effort    GI/Abdomen: BS +, non-tender, non-distended Ext: no clubbing, cyanosis, or edema Psych: flat, sl irritable  Neuro:Speech clear. Right gaze preference.  Left lateral rectus weakness. Left central 7. Demonstrates almost a left inattention-improved. Follows commands. Altered Left finger to nose , decreased fine motor movement LLE. Strength 5/5 RUE and RLE and grossly 4 to 4+/5 LUE and LLE. Senses pain in all 4. No resting tone. Musculoskeletal: No joint swelling or tenderness noted, ROM normal  Assessment/Plan: 1. Functional deficits which require 3+ hours per day of interdisciplinary therapy in a comprehensive inpatient rehab setting. Physiatrist is providing close team supervision and 24 hour management of active medical problems listed below. Physiatrist and rehab team continue to assess barriers to discharge/monitor patient progress toward functional and medical goals  Care Tool:  Bathing    Body parts bathed by patient: Right arm, Chest, Abdomen, Left arm, Front perineal area, Right upper leg, Left upper leg, Face, Right lower leg, Left lower leg, Buttocks   Body parts bathed by helper: Buttocks     Bathing assist Assist Level: Minimal Assistance - Patient > 75%     Upper Body Dressing/Undressing Upper body dressing   What is the patient wearing?: Pull  over shirt    Upper body assist Assist Level: Supervision/Verbal cueing    Lower Body Dressing/Undressing Lower body dressing      What is the patient wearing?: Incontinence brief, Pants     Lower body assist Assist for lower body dressing: Minimal Assistance - Patient > 75%     Toileting Toileting    Toileting assist Assist for toileting: Minimal Assistance - Patient > 75%     Transfers Chair/bed transfer  Transfers assist     Chair/bed transfer assist level: Minimal Assistance - Patient > 75%     Locomotion Ambulation   Ambulation assist       Assist level: Minimal Assistance - Patient > 75% Assistive device: Walker-rolling Max distance: 180 feet   Walk 10 feet activity   Assist     Assist level: Minimal Assistance - Patient > 75% Assistive device: Walker-rolling   Walk 50 feet activity   Assist    Assist level: Minimal Assistance - Patient > 75% Assistive device: Walker-rolling    Walk 150 feet activity   Assist Walk 150 feet activity did not occur: Safety/medical concerns  Assist level: Minimal Assistance - Patient > 75% Assistive device: Walker-rolling    Walk 10 feet on uneven surface  activity   Assist     Assist level: Maximal Assistance - Patient 25 - 49% Assistive device: Hand held assist, Other (comment) (handrail)   Wheelchair     Assist Is the patient using a wheelchair?: Yes Type of Wheelchair: Manual    Wheelchair assist level: Supervision/Verbal cueing Max wheelchair distance: 314 feet    Wheelchair 50 feet with 2 turns activity    Assist        Assist Level: Supervision/Verbal cueing   Wheelchair 150 feet activity     Assist      Assist Level: Supervision/Verbal cueing   Blood pressure 111/83, pulse 89, temperature 97.9 F (36.6 C), temperature source Oral, resp. rate 18, height 5\' 9"  (1.753 m), weight 82.9 kg, SpO2 100 %.  Medical Problem List and Plan: 1. 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 and concern about potential neurological source. There appears to be new infarct or evolution of his right MCA stroke in the medial right frontal lobe. Have reached out to neurology to discuss. Certainly don't think this has anything to do with  nausea/vomiting  -addendum: spoke with Dr. Erlinda Hong regarding new area in right frontal lobe. Recommended bp support, potentially IVF. Will hold HCTZ for now and encourage fluids. Need to follow up on results of zio patch as well.  2.  Impaired mobility: continue Lovenox             -antiplatelet therapy: DAPT X 3 months followed by ASA 325 mg daily 3. Pain Management:  Tylenol prn.  4. Mood/Behavior/Sleep: LCSW to follow for evaluation and support.              --Trazodone prn for insomnia             -antipsychotic agents: N/A 5. Neuropsych/cognition: This patient is ?capable of making decisions on his own behalf. 6. Skin/Wound Care:  Routine pressure relief measures.  7. Hyponatremia: Na reviewed and is 132 on 8/17, repeat Monday 8. HTN: Monitor BP TID--avoid hypotension with Right M2 occlusion.             --continue Cozaar, Coreg and HCTZ  --Will decrease HCTZ to 12.5mg  due to AKI  -7/31 will increase HCTZ back to 25mg  daily  -8/11 last BP 106/73, he is intermittently high but will hold off on increasing antihypertensives for now to avoid hypotension, continue to monitor  -8/15 reduce HCTZ back to 12.5 due to frequent urination, start flomax 0.4mg   Intermittently elevated, continue to monitor,consider increase Cozzar if remains above goal Vitals:   10/03/21 1921 10/04/21 0333  BP: (!) 140/89 111/83  Pulse: 96 89  Resp:  18  Temp:  97.9 F (36.6 C)  SpO2:  100%   8/19 fair control. No changes today  9. T2DM: Hgb A1c- 8.1 (down from 12). Will continue to monitor BS ac/hs             continue Glipizide and metformin  -Well controlled, continue to monitor for now  -8/1 DC glipizide CBG (last 3)  Recent Labs    10/03/21 1815 10/03/21 2102 10/04/21 0604  GLUCAP 78 106* 83  Decreased metformin to 500mg  qd effective 8/19--observe today. May be able to dc entirely   10. AKI  -Will decrease HCTZ to 12.5mg   -NS 50 ml/hr startedd 7/28, Cr reviewed and has normalized  -Will DC fluids  7/31, recheck labs on Wednesday 8/2  -Scr 1.09  8/15, encourage PO intake to prevent repeat AKI, HCTZ decreased back to 12.5mg   -Cr 1.01 8/17---recheck monday 11. HLD  -continue statin, heart healthy diet 12. Urinary incontinence  8/5- will order Timed voiding- q2 hours since cannot tell when going/needs to go or has any control- and cannot feel when wet  8/9 continence improving, continue timed voids  8/11 He had 1 dose oxybutynin yesterday, seem likely he had benefit and had continent episode, will increase oxybutynin to 5mg  TID, monitor PVR  8/15 Urinary retention last night and required IC, will decrease HCTZ and start Flomax  8/19 Urinary retention requiring occ IC, ditropan stopped   -encourage to get out of bed to empty 13. Constipation  -improved overall, senokot daily, PRN miralax 14. Anemia, mild  -HGB stable at 12.0 8/15  -Stool occult reviewed and is negative 15. Gastroparesis: NGT ordered to decompress stomach. NPO. CT ordered to assess for stroke progression and is stable.   MRI per above 8/19 -NGT is out. Pt feels better  -will not reinsert.  -observe for now    LOS:  23 days A FACE TO FACE EVALUATION WAS PERFORMED  9/19 10/04/2021, 10:23 AM

## 2021-10-05 LAB — GLUCOSE, CAPILLARY
Glucose-Capillary: 94 mg/dL (ref 70–99)
Glucose-Capillary: 149 mg/dL — ABNORMAL HIGH (ref 70–99)
Glucose-Capillary: 139 mg/dL — ABNORMAL HIGH (ref 70–99)

## 2021-10-05 LAB — URINE CULTURE: Culture: >=100000 — AB

## 2021-10-05 MED ORDER — NITROFURANTOIN MONOHYD MACRO 100 MG PO CAPS
100.0000 mg | ORAL_CAPSULE | Freq: Two times a day (BID) | ORAL | Status: DC
  Administered 2021-10-05 – 2021-10-10 (×11): 100 mg via ORAL
  Filled 2021-10-05 (×12): qty 1

## 2021-10-05 MED ORDER — SODIUM CHLORIDE 0.45 % IV SOLN: INTRAVENOUS | Status: DC

## 2021-10-05 NOTE — Progress Notes (Addendum)
PROGRESS NOTE   Subjective/Complaints: Pt with poor intake over last 24 hours. Drinking a little. Ate 25% breakfast this am. Pt without new complaints this morning. Didn't sleep that great. Incontinent  ROS: Limited due to cognitive/behavioral    Objective:   MR BRAIN W WO CONTRAST  Result Date: 10/04/2021 CLINICAL DATA:  Stroke suspected, slurring speech, eye deviation to the right EXAM: MRI HEAD WITHOUT AND WITH CONTRAST TECHNIQUE: Multiplanar, multiecho pulse sequences of the brain and surrounding structures were obtained without and with intravenous contrast. CONTRAST:  20mL GADAVIST GADOBUTROL 1 MMOL/ML IV SOLN COMPARISON:  09/09/2021 FINDINGS: Brain: Restricted diffusion in the medial right frontal lobe white matter (series 5, image 80), which is linear and measuring approximately 9 mm. The anterior portion of this lesion demonstrates an ADC correlate (series 6, image 31), while the more posterior portion of this lesion has a less convincing ADC correlate, possibly a combination of acute and subacute infarct. This is new from the 09/09/2021 exam. A small focus of restricted diffusion is also noted again in the dorsal lateral right medulla (series 5, image 54), without definite ADC correlate and decreased in size compared to prior exam. No acute hemorrhage, mass, mass effect, or midline shift. No hydrocephalus or extra-axial collection. Redemonstrated hemosiderin deposition in the right caudate head. T2 hyperintense signal in the periventricular white matter, likely the sequela of chronic small vessel ischemic disease. Remote lacunar infarcts in the bilateral basal ganglia Vascular: Normal arterial flow voids. Skull and upper cervical spine: Normal marrow signal. Sinuses/Orbits: Mucous retention cysts and mucosal thickening in the ethmoid air cells. Mucous retention cyst in the left maxillary sinus. Mucosal thickening in the right maxillary  sinus. Other: The mastoids are well aerated. IMPRESSION: 1. Acute and also likely subacute infarct in the medial right frontal lobe white matter. 2. Restricted diffusion is again noted in the dorsal lateral right medulla, without definite ADC correlate, possibly evolution of the prior infarct versus additional subacute infarct. These results will be called to the ordering clinician or representative by the Radiologist Assistant, and communication documented in the PACS or [employer]. Electronically Signed   By: Wiliam Ke M.D.   On: 10/04/2021 03:44   DG Abd 2 Views  Result Date: 10/03/2021 CLINICAL DATA:  Aspiration pneumonia.  Nausea and vomiting. EXAM: ABDOMEN - 2 VIEW COMPARISON:  10/02/2021 FINDINGS: Normal sized heart. Clear lung bases. Normal bowel gas pattern without free peritoneal air. Thoracic and lumbar spine degenerative changes and mild scoliosis. IMPRESSION: No acute abnormality. Electronically Signed   By: Beckie Salts M.D.   On: 10/03/2021 11:32   DG Chest 2 View  Result Date: 10/03/2021 CLINICAL DATA:  Aspiration pneumonia EXAM: CHEST - 2 VIEW COMPARISON:  None Available. FINDINGS: Normal mediastinum and cardiac silhouette. Normal pulmonary vasculature. No evidence of effusion, infiltrate, or pneumothorax. No acute bony abnormality. IMPRESSION: No acute cardiopulmonary process. Electronically Signed   By: Genevive Bi M.D.   On: 10/03/2021 11:28   Recent Labs    10/02/21 1553 10/03/21 1202  WBC 5.6 5.5  HGB 12.2* 12.6*  HCT 35.2* 36.3*  PLT 481* 493*    Recent Labs    10/02/21 1553  NA 132*  K 4.2  CL 102  CO2 22  GLUCOSE 124*  BUN 13  CREATININE 1.01  CALCIUM 9.0     Intake/Output Summary (Last 24 hours) at 10/05/2021 0940 Last data filed at 10/05/2021 0815 Gross per 24 hour  Intake 720 ml  Output 400 ml  Net 320 ml        Physical Exam: Vital Signs Blood pressure 128/85, pulse 84, temperature 98 F (36.7 C), temperature source Oral, resp.  rate 15, height 5\' 9"  (1.753 m), weight 82.9 kg, SpO2 100 %. Constitutional: No distress . Vital signs reviewed. HEENT: NCAT, EOMI, oral membranes moist Neck: supple Cardiovascular: RRR without murmur. No JVD    Respiratory/Chest: CTA Bilaterally without wheezes or rales. Normal effort    GI/Abdomen: BS +, non-tender, non-distended Ext: no clubbing, cyanosis, or edema Psych: flat, often hard to engage.  Neuro:Speech clear. Right gaze preference.  Left lateral rectus weakness. Left central 7. Demonstrates almost a left inattention-improved. Follows commands. Altered Left finger to nose , decreased fine motor movement LLE. Strength 5/5 RUE and RLE and grossly 4 to 4+/5 LUE and LLE. Senses pain in all 4. No resting tone. Musculoskeletal: No joint swelling or tenderness noted, ROM normal  Assessment/Plan: 1. Functional deficits which require 3+ hours per day of interdisciplinary therapy in a comprehensive inpatient rehab setting. Physiatrist is providing close team supervision and 24 hour management of active medical problems listed below. Physiatrist and rehab team continue to assess barriers to discharge/monitor patient progress toward functional and medical goals  Care Tool:  Bathing    Body parts bathed by patient: Right arm, Chest, Abdomen, Left arm, Front perineal area, Right upper leg, Left upper leg, Face, Right lower leg, Left lower leg, Buttocks   Body parts bathed by helper: Buttocks     Bathing assist Assist Level: Minimal Assistance - Patient > 75%     Upper Body Dressing/Undressing Upper body dressing   What is the patient wearing?: Pull over shirt    Upper body assist Assist Level: Supervision/Verbal cueing    Lower Body Dressing/Undressing Lower body dressing      What is the patient wearing?: Incontinence brief, Pants     Lower body assist Assist for lower body dressing: Minimal Assistance - Patient > 75%     Toileting Toileting    Toileting assist Assist  for toileting: Minimal Assistance - Patient > 75%     Transfers Chair/bed transfer  Transfers assist     Chair/bed transfer assist level: Minimal Assistance - Patient > 75%     Locomotion Ambulation   Ambulation assist      Assist level: Minimal Assistance - Patient > 75% Assistive device: Walker-rolling Max distance: 180 feet   Walk 10 feet activity   Assist     Assist level: Minimal Assistance - Patient > 75% Assistive device: Walker-rolling   Walk 50 feet activity   Assist    Assist level: Minimal Assistance - Patient > 75% Assistive device: Walker-rolling    Walk 150 feet activity   Assist Walk 150 feet activity did not occur: Safety/medical concerns  Assist level: Minimal Assistance - Patient > 75% Assistive device: Walker-rolling    Walk 10 feet on uneven surface  activity   Assist     Assist level: Maximal Assistance - Patient 25 - 49% Assistive device: Hand held assist, Other (comment) (handrail)   Wheelchair     Assist Is the patient using a wheelchair?: Yes Type of Wheelchair: Manual  Wheelchair assist level: Supervision/Verbal cueing Max wheelchair distance: 314 feet    Wheelchair 50 feet with 2 turns activity    Assist        Assist Level: Supervision/Verbal cueing   Wheelchair 150 feet activity     Assist      Assist Level: Supervision/Verbal cueing   Blood pressure 128/85, pulse 84, temperature 98 F (36.7 C), temperature source Oral, resp. rate 15, height 5\' 9"  (1.753 m), weight 82.9 kg, SpO2 100 %.  Medical Problem List and Plan: 1. 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 and concern about potential  neurological source. There appears to be new infarct or evolution of his right MCA stroke in the medial right frontal lobe. I spoke with Dr. Erlinda Hong regarding the new infarct in right frontal lobe. Recommended bp support. Zio patch is off, and they will read. Will add IVF today since his po intake is poor. 2.  Impaired mobility: continue Lovenox             -antiplatelet therapy: DAPT X 3 months followed by ASA 325 mg daily 3. Pain Management:  Tylenol prn.  4. Mood/Behavior/Sleep: LCSW to follow for evaluation and support.              --Trazodone prn for insomnia             -antipsychotic agents: N/A 5. Neuropsych/cognition: This patient is ?capable of making decisions on his own behalf. 6. Skin/Wound Care:  Routine pressure relief measures.  7. Hyponatremia: Na reviewed and is 132 on 8/17, repeat Monday 8. HTN: Monitor BP TID--avoid hypotension with Right M2 occlusion.             --continue Cozaar, Coreg and HCTZ  --Will decrease HCTZ to 12.5mg  due to AKI  -7/31 will increase HCTZ back to 25mg  daily  -8/11 last BP 106/73, he is intermittently high but will hold off on increasing antihypertensives for now to avoid hypotension, continue to monitor  -8/15 reduce HCTZ back to 12.5 due to frequent urination, start flomax 0.4mg   Intermittently elevated, continue to monitor,consider increase Cozzar if remains above goal Vitals:   10/04/21 1911 10/05/21 0402  BP: 132/87 128/85  Pulse: 84 84  Resp: 18 15  Temp: 98.2 F (36.8 C) 98 F (36.7 C)  SpO2: 98% 100%   8/20 hctz held yesterday d/t poor po intake and for "permissive" elevation to allow cerebral perfusion   -will start IVF today  9. T2DM: Hgb A1c- 8.1 (down from 12). Will continue to monitor BS ac/hs             continue Glipizide and metformin  -Well controlled, continue to monitor for now  -8/1 DC glipizide CBG (last 3)  Recent Labs    10/04/21 1620 10/04/21 2109 10/05/21 0605  GLUCAP 111* 121* 94  Decreased metformin to 500mg   qd effective 8/19--observe today. May be able to dc entirely  8/20 will hold metformin for now until po picks up   10. AKI  -Will decrease HCTZ to 12.5mg   -NS 50 ml/hr startedd 7/28, Cr reviewed and has normalized  -Will  DC fluids 7/31, recheck labs on Wednesday 8/2  -Scr 1.09  8/15, encourage PO intake to prevent repeat AKI, HCTZ decreased back to 12.5mg   -Cr 1.01 8/17  8/20-IVF as above. Recheck labs tomorrow 11. HLD  -continue statin, heart healthy diet 12. Urinary incontinence  8/5- will order Timed voiding- q2 hours since cannot tell when going/needs to go or has any control- and cannot feel when wet  8/9 continence improving, continue timed voids  8/11 He had 1 dose oxybutynin yesterday, seem likely he had benefit and had continent episode, will increase oxybutynin to 5mg  TID, monitor PVR  8/15 Urinary retention last night and required IC, will decrease HCTZ and start Flomax  8/19 Urinary retention requiring occ IC, ditropan stopped   -encourage to get out of bed to empty  8/20 CONTINUE as above   -ucx + 100k enterococcus --begin macrobid 100mg  bid 13. Constipation  -improved overall, senokot daily, PRN miralax 14. Anemia, mild  -HGB stable at 12.0 8/15  -Stool occult reviewed and is negative 15. Gastroparesis: NGT ordered to decompress stomach. NPO. CT ordered to assess for stroke progression and is stable.   MRI per above 8/19-20 -NGT is out. Pt feels better in this regard  -will not reinsert NGT  -observe for now    LOS:  24 days A FACE TO [place] 10/05/2021, 9:40 AM

## 2021-10-05 NOTE — Progress Notes (Signed)
Speech Language Pathology Daily Session Note  Patient Details  Name: Shawn Austin MRN: [id number] Date of Birth: Nov 19, 1965  Today's Date: 10/05/2021 SLP Individual Time: 1300-1345 SLP Individual Time Calculation (min): 45 min  Short Term Goals: Week 3: SLP Short Term Goal 1 (Week 3): STG=LTG due to ELOS  Skilled Therapeutic Interventions:  Pt was seen for skilled ST targeting cognitive goals.  Upon arrival, pt was laying in bed, hooked to IV fluids, and with multiple empty emesis bags noted at bedside.  Pt reported that he had been sick all day and had thrown up earlier.  Spoke with nursing who reported that pt has had nausea over the last few days but that he may participate in treatment as tolerated.  Pt was agreeable to drinking a ginger ale to try and settle his stomach during therapy session.   Cues needed for safe positioning in bed when drinking.  Pt freely recalled his anticipated discharge date but needed mod question cues to identify specific needs for the home environment in light of his current level of function.  Pt could also identify x2 deficits post stroke (visual scanning and balance) with mod question cues.  Pt was left in bed with bed alarm set and call bell within reach. Continue per current plan of care.      Pain Pain Assessment Pain Scale: 0-10 Pain Score: 0-No pain  Therapy/Group: Individual Therapy  Avanell Banwart, Melanee Spry 10/05/2021, 1:46 PM

## 2021-10-05 NOTE — Progress Notes (Signed)
Small amount of Blood noted in diaper. At 0615, patient voided 150cc's of tea colored urine, continently with assist with urinal. Alfredo Martinez A

## 2021-10-05 NOTE — Progress Notes (Signed)
Physical Therapy Session Note  Patient Details  Name: Shawn Austin MRN: [id number] Date of Birth: 1965/08/12  Today's Date: 10/05/2021 PT Individual Time: 4270-6237 PT Individual Time Calculation (min): 64 min   Short Term Goals: Week 1:  PT Short Term Goal 1 (Week 1): Pt will perform sit to stand with LRAD and min A PT Short Term Goal 1 - Progress (Week 1): Met PT Short Term Goal 2 (Week 1): Pt will perform stand pivot transfer with LRAD and mod A PT Short Term Goal 2 - Progress (Week 1): Met PT Short Term Goal 3 (Week 1): Pt will ambulate 50 ft with LRAD and mod A PT Short Term Goal 3 - Progress (Week 1): Progressing toward goal Week 2:  PT Short Term Goal 1 (Week 2): Pt will perform sit to stand with LRAD and CGA PT Short Term Goal 1 - Progress (Week 2): Met PT Short Term Goal 2 (Week 2): Pt will perform stand pivot transfer with LRAD and min A PT Short Term Goal 2 - Progress (Week 2): Met PT Short Term Goal 3 (Week 2): Pt will ambulate 25 feet with mod A and LRAD PT Short Term Goal 3 - Progress (Week 2): Met PT Short Term Goal 5 (Week 2): Pt will perform sit to stand with SBA PT Short Term Goal 5 - Progress (Week 2): Not met Week 3:  PT Short Term Goal 1 (Week 3): Pt will perform stand pivot transfer with CGA PT Short Term Goal 1 - Progress (Week 3): Not met PT Short Term Goal 2 (Week 3): Pt will ambulate 25 ft with min A PT Short Term Goal 2 - Progress (Week 3): Met Week 4:  PT Short Term Goal 1 (Week 4): STG=LTG  Skilled Therapeutic Interventions/Progress Updates:      Therapy Documentation Precautions:  Precautions Precautions: Fall Precaution Comments: R gaze preference, R lateral lean in sitting/standing Restrictions Weight Bearing Restrictions: No  Pt received semi-reclined in bed and agreeable to PT session. Pt requires supervision with bed mobility. Initially pt mod A for sitting balance as pt presents with significant right lateral lean and with verbal and  tactile cueing pt progressed to CGA. Pt progressed from min A to CGA with sit to stand and min A with stand pivot to w/c. Pt transported to toilet and unable to void. Pt transported to dayroom by wheelchair for time management and energy conservation. With visual feedback from mirror pt able to self-correct posture and obtain midline in sitting. Pt participated in blocked practice of sit to stand x 10 CGA with verbal cues for hand placement during set-up and visual feedback from mirror. Pt ambulated ~56 ft with RW and w/c follow min A. Pt presents with forward flexed posture, decreased step length, and right lateral lean. Pt requires verbal cue of "left" to facilitate weight shift. Pt transported to main gym by w/c and participated in negotiation of 12 steps with min A and 2 HR's. Pt requires verbal cues for sequencing and weight shifting. Pt transported to ortho gym to practice car transfer and pt reported nausea. Pt with emesis episode and transported to room and nursing informed. BP was 103/82 (90) and pulse was 102. Pt left seated in w/c at bedside with chair alarm on and all needs within reach.    Therapy/Group: Individual Therapy  Verl Dicker Verl Dicker PT, DPT  10/05/2021, 7:41 AM

## 2021-10-05 NOTE — Progress Notes (Signed)
+/-   sleep.  Without complaint of N & V.  Poor intake this shift-[120cc's water and few bites of applesauce. Right gaze preference. Incontinent of urine with timed toileting. Bladder scan at 2300=190cc's. At 0500, bladder scan=340cc's. No I & O cath this shift. Doesn't call when wet. Denies pain. Shawn Austin A

## 2021-10-05 NOTE — Progress Notes (Signed)
16 fr indwelling catheter inserted per orders. Sterile technique used. Positive for urine return, urine noted to be tea colored/bloody. MD aware. Patient tolerated insertion, no concerns. Care ongoing. Call bell within reach, bed alarms in place.

## 2021-10-05 NOTE — Progress Notes (Signed)
Patient with episodes of nausea with small amounts of emesis. Administered PRN compazine with positive effect. Patient reports nausea has improved.

## 2021-10-06 DIAGNOSIS — B370 Candidal stomatitis: Secondary | ICD-10-CM

## 2021-10-06 LAB — GLUCOSE, CAPILLARY
Glucose-Capillary: 107 mg/dL — ABNORMAL HIGH (ref 70–99)
Glucose-Capillary: 228 mg/dL — ABNORMAL HIGH (ref 70–99)
Glucose-Capillary: 142 mg/dL — ABNORMAL HIGH (ref 70–99)
Glucose-Capillary: 242 mg/dL — ABNORMAL HIGH (ref 70–99)

## 2021-10-06 LAB — BASIC METABOLIC PANEL
Anion gap: 7 (ref 5–15)
BUN: 9 mg/dL (ref 6–20)
CO2: 22 mmol/L (ref 22–32)
Calcium: 8.4 mg/dL — ABNORMAL LOW (ref 8.9–10.3)
Chloride: 105 mmol/L (ref 98–111)
Creatinine, Ser: 0.95 mg/dL (ref 0.61–1.24)
GFR, Estimated: >60 mL/min (ref >60)
Glucose, Bld: 111 mg/dL — ABNORMAL HIGH (ref 70–99)
Potassium: 3.7 mmol/L (ref 3.5–5.1)
Sodium: 134 mmol/L — ABNORMAL LOW (ref 135–145)

## 2021-10-06 LAB — CBC
HCT: 33.2 % — ABNORMAL LOW (ref 39.0–52.0)
Hemoglobin: 11.4 g/dL — ABNORMAL LOW (ref 13.0–17.0)
MCH: 27.6 pg (ref 26.0–34.0)
MCHC: 34.3 g/dL (ref 30.0–36.0)
MCV: 80.4 fL (ref 80.0–100.0)
Platelets: 435 10*3/uL — ABNORMAL HIGH (ref 150–400)
RBC: 4.13 MIL/uL — ABNORMAL LOW (ref 4.22–5.81)
RDW: 12.5 % (ref 11.5–15.5)
WBC: 8.1 10*3/uL (ref 4.0–10.5)
nRBC: 0 % (ref 0.0–0.2)

## 2021-10-06 MED ORDER — NYSTATIN 100000 UNIT/ML MT SUSP
5.0000 mL | Freq: Four times a day (QID) | OROMUCOSAL | Status: DC
  Administered 2021-10-06 – 2021-10-10 (×16): 500000 [IU] via ORAL
  Filled 2021-10-06 (×11): qty 5

## 2021-10-06 MED ORDER — CHLORHEXIDINE GLUCONATE CLOTH 2 % EX PADS: 6.0000 | MEDICATED_PAD | Freq: Two times a day (BID) | CUTANEOUS | Status: DC

## 2021-10-06 MED ORDER — FLUCONAZOLE 100 MG PO TABS
200.0000 mg | ORAL_TABLET | Freq: Once | ORAL | Status: AC
  Administered 2021-10-06: 200 mg via ORAL
  Filled 2021-10-06: qty 2

## 2021-10-06 NOTE — Progress Notes (Signed)
Speech Language Pathology Daily Session Note  Patient Details  Name: Shawn Austin MRN: [id number] Date of Birth: 14-Nov-1965  Today's Date: 10/06/2021 SLP Individual Time: 1400-1443 SLP Individual Time Calculation (min): 43 min  Short Term Goals: Week 3: SLP Short Term Goal 1 (Week 3): STG=LTG due to ELOS  Skilled Therapeutic Interventions: Skilled treatment session focused on dysphagia goals and completion of patient and family education. SLP facilitated session by providing skilled observation with patient consuming thin liquids via cup. With Min verbal cues, patient took small, single sips and did not demonstrate any overt s/s of aspiration. Recommend patient continue current diet with full supervision to maximize safety with PO intake. Patient's wife present throughout session and education regarding diet recommendations, appropriate textures, swallowing compensatory strategies and medication administration. SLP also facilitated session by providing education regarding patient's current cognitive deficits and strategies to utilize at home to maximize memory, attention, visual scanning and overall safety with functional and familiar tasks. SLP emphasized the importance of 24 hour supervision at home to ensure safe decision making and the need to provide supervision with all iADLs. Patient's wife verbalized understanding and handouts were given to reinforce information. Patient left upright in wheelchair with his wife present. Continue with current plan of care.     Pain No/Denies Pain   Therapy/Group: Individual Therapy  Oda Lansdowne 10/06/2021, 2:59 PM

## 2021-10-06 NOTE — Progress Notes (Signed)
Patient ID: Shawn Austin, male   DOB: 1965-12-18, 56 y.o.   MRN: [id number]  With pt's medical issues team and MD have decided to extend him to 8/25. Katina-girlfriend is here for continued education today. Work toward discharge now Friday.

## 2021-10-06 NOTE — Progress Notes (Signed)
PROGRESS NOTE   Subjective/Complaints: Reports nausea has improved.  Foley in place. He wants to go home tomorrow.  LBM 8/20  ROS: Limited due to cognitive/behavioral    Objective:   No results found. Recent Labs    10/06/21 0604  WBC 8.1  HGB 11.4*  HCT 33.2*  PLT 435*    Recent Labs    10/06/21 0604  NA 134*  K 3.7  CL 105  CO2 22  GLUCOSE 111*  BUN 9  CREATININE 0.95  CALCIUM 8.4*     Intake/Output Summary (Last 24 hours) at 10/06/2021 0957 Last data filed at 10/06/2021 1027 Gross per 24 hour  Intake 3003.22 ml  Output 1825 ml  Net 1178.22 ml         Physical Exam: Vital Signs Blood pressure 113/73, pulse 79, temperature (!) 97.5 F (36.4 C), temperature source Oral, resp. rate 18, height 5\' 9"  (1.753 m), weight 82.9 kg, SpO2 98 %. Constitutional: No distress . Vital signs reviewed. HEENT: NCAT, EOMI, oral membranes moist, + white coating on tongue Neck: supple Cardiovascular: RRR without murmur. No JVD    Respiratory/Chest: CTA Bilaterally without wheezes or rales. Normal effort    GI/Abdomen: BS +, non-tender, non-distended GU: yellow urine Ext: no clubbing, cyanosis, or edema Psych: flat, often hard to engage.  Neuro:Speech clear. Right gaze preference.  Left lateral rectus weakness. Left central 7. Demonstrates almost a left inattention-improved. Follows commands. Altered Left finger to nose , decreased fine motor movement LLE. Strength 5/5 RUE and RLE and grossly 4 to 4+/5 LUE and LLE. Senses pain in all 4. No resting tone. Musculoskeletal: No joint swelling or tenderness noted, ROM normal  Assessment/Plan: 1. Functional deficits which require 3+ hours per day of interdisciplinary therapy in a comprehensive inpatient rehab setting. Physiatrist is providing close team supervision and 24 hour management of active medical problems listed below. Physiatrist and rehab team continue to assess  barriers to discharge/monitor patient progress toward functional and medical goals  Care Tool:  Bathing    Body parts bathed by patient: Right arm, Chest, Abdomen, Left arm, Front perineal area, Right upper leg, Left upper leg, Face, Right lower leg, Left lower leg, Buttocks   Body parts bathed by helper: Buttocks     Bathing assist Assist Level: Minimal Assistance - Patient > 75%     Upper Body Dressing/Undressing Upper body dressing   What is the patient wearing?: Pull over shirt    Upper body assist Assist Level: Supervision/Verbal cueing    Lower Body Dressing/Undressing Lower body dressing      What is the patient wearing?: Incontinence brief, Pants     Lower body assist Assist for lower body dressing: Minimal Assistance - Patient > 75%     Toileting Toileting    Toileting assist Assist for toileting: Minimal Assistance - Patient > 75%     Transfers Chair/bed transfer  Transfers assist     Chair/bed transfer assist level: Minimal Assistance - Patient > 75%     Locomotion Ambulation   Ambulation assist      Assist level: Minimal Assistance - Patient > 75% Assistive device: Walker-rolling Max distance: 180 feet  Walk 10 feet activity   Assist     Assist level: Minimal Assistance - Patient > 75% Assistive device: Walker-rolling   Walk 50 feet activity   Assist    Assist level: Minimal Assistance - Patient > 75% Assistive device: Walker-rolling    Walk 150 feet activity   Assist Walk 150 feet activity did not occur: Safety/medical concerns  Assist level: Minimal Assistance - Patient > 75% Assistive device: Walker-rolling    Walk 10 feet on uneven surface  activity   Assist     Assist level: Maximal Assistance - Patient 25 - 49% Assistive device: Hand held assist, Other (comment) (handrail)   Wheelchair     Assist Is the patient using a wheelchair?: Yes Type of Wheelchair: Manual    Wheelchair assist level:  Supervision/Verbal cueing Max wheelchair distance: 314 feet    Wheelchair 50 feet with 2 turns activity    Assist        Assist Level: Supervision/Verbal cueing   Wheelchair 150 feet activity     Assist      Assist Level: Supervision/Verbal cueing   Blood pressure 113/73, pulse 79, temperature (!) 97.5 F (36.4 C), temperature source Oral, resp. rate 18, height 5\' 9"  (1.753 m), weight 82.9 kg, SpO2 98 %.  Medical Problem List and Plan: 1. 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 and concern about potential neurological source. There appears to be new infarct or evolution of his right MCA stroke in the medial right frontal lobe. I spoke with Dr. Erlinda Hong regarding the new infarct in right frontal lobe. Recommended bp support. Zio patch is off, and they will read. Will add IVF today since his po intake is poor. -DC IVF today, advance diet 2.  Impaired mobility: continue Lovenox             -antiplatelet therapy: DAPT X 3 months followed by ASA 325 mg daily 3. Pain Management:  Tylenol prn.  4. Mood/Behavior/Sleep: LCSW to follow for evaluation and support.              --Trazodone prn for insomnia             -antipsychotic agents: N/A 5. Neuropsych/cognition: This patient is ?capable of making decisions on his own behalf. 6. Skin/Wound Care:  Routine pressure relief measures.  7. Hyponatremia: Na reviewed and is 132 on 8/17, repeat Monday 8. HTN: Monitor BP TID--avoid hypotension with Right M2 occlusion.             --continue Cozaar, Coreg and HCTZ  --Will decrease HCTZ to 12.5mg  due to AKI  -7/31 will increase HCTZ back to 25mg  daily  -8/11 last BP 106/73, he is intermittently  high but will hold off on increasing antihypertensives for now to avoid hypotension, continue to monitor  -8/15 reduce HCTZ back to 12.5 due to frequent urination, start flomax 0.4mg   Intermittently elevated, continue to monitor,consider increase Cozzar if remains above goal Vitals:   10/05/21 1904 10/06/21 0406  BP: 131/75 113/73  Pulse: 92 79  Resp: 18 18  Temp: 98.5 F (36.9 C) (!) 97.5 F (36.4 C)  SpO2: 100% 98%   8/20 hctz held yesterday d/t poor po intake and for "permissive" elevation to allow cerebral perfusion   -will start IVF today  9. T2DM: Hgb A1c- 8.1 (down from 12). Will continue to monitor BS ac/hs             continue Glipizide and metformin  -Well controlled, continue to monitor for now  -8/1 DC glipizide CBG (last 3)  Recent Labs    10/05/21 2103 10/06/21 0603 10/06/21 1147  GLUCAP 139* 107* 228*  Decreased metformin to 500mg  qd effective 8/19--observe today. May be able to dc entirely  8/20 will hold metformin for now until po picks up   10. AKI  -Will decrease HCTZ to 12.5mg   -NS 50 ml/hr startedd 7/28, Cr reviewed and has normalized  -Will DC fluids 7/31, recheck labs on Wednesday 8/2  -Scr 1.09  8/15, encourage PO intake to prevent repeat AKI, HCTZ decreased back to 12.5mg   -Cr 1.01 8/17  8/20-IVF as above. Recheck labs tomorrow  8/21-DC IVF, recheck labs tomorrow 11. HLD  -continue statin, heart healthy diet 12. Urinary incontinence  8/5- will order Timed voiding- q2 hours since cannot tell when going/needs to go or has any control- and cannot feel when wet  8/9 continence improving, continue timed voids  8/11 He had 1 dose oxybutynin yesterday, seem likely he had benefit and had continent episode, will increase oxybutynin to 5mg  TID, monitor PVR  8/15 Urinary retention last night and required IC, will decrease HCTZ and start Flomax  8/19 Urinary retention requiring occ IC, ditropan stopped   -encourage to get out of bed to empty  8/20 CONTINUE  as above   -ucx + 100k enterococcus --begin macrobid 100mg  bid  8/21 foley was started yesterday, DC and start voiding trial 13. Constipation  -improved overall, senokot daily, PRN miralax  -LBM 8/20 14. Anemia, mild  -HGB stable at 11.4 8/21, follow as outpatient  -Stool occult reviewed and is negative 15. Gastroparesis: NGT ordered to decompress stomach. NPO. CT ordered to assess for stroke progression and is stable.   MRI per above 8/19-20 -NGT is out. Pt feels better in this regard  -will not reinsert NGT  -Advance diet to soft 16.Thrush  -Diflucan started    LOS:  25 days A FACE TO FACE EVALUATION WAS PERFORMED  9/21 10/06/2021, 9:57 AM

## 2021-10-06 NOTE — Progress Notes (Signed)
Physical Therapy Session Note  Patient Details  Name: Shawn Austin MRN: [id number] Date of Birth: 17-Jan-1966  Today's Date: 10/06/2021 PT Individual Time: 7618-4859 PT Individual Time Calculation (min): 44 min   Short Term Goals: Week 1:  PT Short Term Goal 1 (Week 1): Pt will perform sit to stand with LRAD and min A PT Short Term Goal 1 - Progress (Week 1): Met PT Short Term Goal 2 (Week 1): Pt will perform stand pivot transfer with LRAD and mod A PT Short Term Goal 2 - Progress (Week 1): Met PT Short Term Goal 3 (Week 1): Pt will ambulate 50 ft with LRAD and mod A PT Short Term Goal 3 - Progress (Week 1): Progressing toward goal Week 2:  PT Short Term Goal 1 (Week 2): Pt will perform sit to stand with LRAD and CGA PT Short Term Goal 1 - Progress (Week 2): Met PT Short Term Goal 2 (Week 2): Pt will perform stand pivot transfer with LRAD and min A PT Short Term Goal 2 - Progress (Week 2): Met PT Short Term Goal 3 (Week 2): Pt will ambulate 25 feet with mod A and LRAD PT Short Term Goal 3 - Progress (Week 2): Met PT Short Term Goal 5 (Week 2): Pt will perform sit to stand with SBA PT Short Term Goal 5 - Progress (Week 2): Not met Week 3:  PT Short Term Goal 1 (Week 3): Pt will perform stand pivot transfer with CGA PT Short Term Goal 1 - Progress (Week 3): Not met PT Short Term Goal 2 (Week 3): Pt will ambulate 25 ft with min A PT Short Term Goal 2 - Progress (Week 3): Met Week 4:  PT Short Term Goal 1 (Week 4): STG=LTG  Skilled Therapeutic Interventions/Progress Updates:      Therapy Documentation Precautions:  Precautions Precautions: Fall Precaution Comments: R gaze preference, R lateral lean in sitting/standing Restrictions Weight Bearing Restrictions: No  Pt received seated edge of bed with S/O present for family education. Pt declines pain or nausea during session. PT instructed S/O how to appropriately guard and provided verbal cues with stand pivot transfers with RW.  Pt continues to require min A with stand pivot transfers and requires frequent cueing for set-up and sequencing. Pt transported by w/c for time management and participated in gait training of ~65 ft with RW and min A. Pt requires verbal cue "left" to facilitate equal weight shifting and midline alignment with ambulation. Pt transported to ortho gym and required min A with car transfer. S/O demonstrated appropriate technique with guarding and cues with transfer. Pt transported to main gym and participated in stair training. Pt navigated total of 8 (6 inch ) steps with min A and verbal cues for LE placement. S/O demonstrated safe guarding technique with patient for stair training. Pt propelled w/c ~150 ft to room and left seated in w/c at bedside with chair alarm on and all needs within reach.    Therapy/Group: Individual Therapy  Verl Dicker Verl Dicker PT, DPT  10/06/2021, 7:49 AM

## 2021-10-06 NOTE — Progress Notes (Signed)
Occupational Therapy Session Note  Patient Details  Name: Shawn Austin MRN: [id number] Date of Birth: 1965/03/09  Today's Date: 10/06/2021 OT Individual Time: 2671-2458 OT Individual Time Calculation (min): 55 min    Session 2 OT Individual Time: [address]25 OT Individual Time Calculation (min): 58 min    Short Term Goals: Week 4:  OT Short Term Goal 1 (Week 4): STG= LTG d/t ELOS  Skilled Therapeutic Interventions/Progress Updates:    Pt received supine with no c/o pain, agreeable to OT session. Discussed possible extension and pt adamantly refusing this despite OT education on OT POC and CLOF. He came to EOB with (S), rigid R head turn with R gaze. He required max cueing for L visual scanning. He declined dressing d/t foley and IV, so he donned two gowns instead. He stood from EOB with cueing for hand placement and CGA. He completed an ambulatory transfer to the w/c with min A d/t R lean, requiring cueing for midline awareness. He sat to complete oral care at the sink. MOD cueing required for initiation of oral care with very poor visual attention to midline. He required an extended amount of time to terminate brushing his teeth and min cueing overall. He was taken to the therapy gym via w/c. Min A ambulatory transfer to the mat. He worked on standing level trunk elongation toward the L, including focus on reaching into the L visual field to promote both scanning and coming out of his R lean. He was able to perform this with CGA overall, one instance of a heavy R lean that he needed to reset with a sit <> stand to reverse. He was quiet and apathetic throughout session. He ended with 2x20 repetitions of core rotations/twists, holding a 4lb dumbbell. He required cueing for visual attention to the L. He was left sitting up in the w/c with all needs met, chair alarm set.    Session 2 Family education session completed with pt and his wife Shawn Austin. Verbal education provided re fall risk  reduction, energy conservation strategies, home carryover of transfer training, ADLs, and IADLs. Demonstration and hands on training completed for pt performance of UB/LB bathing and dressing at CGA-min A level, toileting hygiene and transfers, and shower transfers. Extensive safety education provided re R lean, visual perceptual deficits, and cognitive deficits. Shawn Austin was able to demonstrate safety in pt handling during bathroom transfers, shower transfers, and dressing tasks. Pt had one large R LOB that his wife was able to manage and correct. Shawn Austin was provided with a custom handout with discharge recommendations. Pt handoff to SLP in room.     Therapy Documentation Precautions:  Precautions Precautions: Fall Precaution Comments: R gaze preference, R lateral lean in sitting/standing Restrictions Weight Bearing Restrictions: No  Therapy/Group: Individual Therapy  [name] 10/06/2021, 6:49 AM

## 2021-10-06 NOTE — Progress Notes (Signed)
Rested better tonight. Without complaint of pain or N & V. 1/2 NS infusing at 75cc/hr. Foley patent. Urine looks better, no longer tea colored. Shawn Austin A

## 2021-10-06 NOTE — Progress Notes (Signed)
Occupational Therapy Discharge Summary  Patient Details  Name: Shawn Austin MRN: [id number] Date of Birth: 1965/11/01  Date of Discharge from OT service:{Time; dates multiple:304500300}  {CHL IP REHAB OT TIME CALCULATIONS:304400400}   Patient has met {NUMBERS 0-12:18577} of {NUMBERS 0-12:18577} long term goals due to improved activity tolerance, improved balance, postural control, ability to compensate for deficits, improved attention, improved awareness, and improved coordination.  Patient to discharge at overall  CGA-min A  level.  Patient's care partner is independent to provide the necessary physical and cognitive assistance at discharge.    Reasons goals not met: ***  Recommendation:  Patient will benefit from ongoing skilled OT services in home health setting to continue to advance functional skills in the area of BADL.  Equipment: BSC  Reasons for discharge: treatment goals met and discharge from hospital  Patient/family agrees with progress made and goals achieved: Yes  OT Discharge Precautions/Restrictions  Precautions Precautions: Fall Precaution Comments: R gaze preference, R lateral lean Restrictions Weight Bearing Restrictions: No ADL ADL Eating: Set up Grooming: Supervision/safety, Moderate cueing Where Assessed-Grooming: Sitting at sink Upper Body Bathing: Supervision/safety Where Assessed-Upper Body Bathing: Sitting at sink Lower Body Bathing: Supervision/safety Where Assessed-Lower Body Bathing: Sitting at sink Upper Body Dressing: Supervision/safety Where Assessed-Upper Body Dressing: Sitting at sink Lower Body Dressing: Contact guard Where Assessed-Lower Body Dressing: Wheelchair Toileting: Minimal assistance Where Assessed-Toileting: Glass blower/designer: Psychiatric nurse Method: Counselling psychologist: Bedside commode (BSC over toilet) Social research officer, government: Environmental education officer  Method: Heritage manager: Radio broadcast assistant, Grab bars ADL Comments: pt had difficulty scanning to L due to decreased oculomotor strength Vision Baseline Vision/History: 0 No visual deficits Patient Visual Report: Blurring of vision Vision Assessment?: Yes Eye Alignment: Impaired (comment) (severe R gaze preference but can scan L with adequate time and cueing) Ocular Range of Motion: Restricted on the left Alignment/Gaze Preference: Gaze right;Head tilt Tracking/Visual Pursuits: Decreased smoothness of vertical tracking;Decreased smoothness of horizontal tracking;Left eye does not track laterally;Decreased smoothness of eye movement to LEFT superior field;Decreased smoothness of eye movement to LEFT inferior field;Unable to hold eye position out of midline Convergence: Impaired (comment) Depth Perception: Undershoots Additional Comments: Significant R gaze preference, requiring max cueing to scan L. R peripheral occlusion glasses are a useful compensatory strategy. He has improved since eval significantly but this continues to be his biggest barrier Perception  Perception: Impaired Inattention/Neglect: Does not attend to left visual field Praxis Praxis: Impaired Praxis Impairment Details: Motor planning Cognition Cognition Overall Cognitive Status: Impaired/Different from baseline Arousal/Alertness: Awake/alert Orientation Level: Person;Place;Situation Person: Oriented Place: Oriented Situation: Oriented Memory: Impaired Memory Impairment: Retrieval deficit;Storage deficit;Decreased recall of new information Attention: Sustained Sustained Attention: Impaired Sustained Attention Impairment: Verbal basic;Functional basic Awareness Impairment: Intellectual impairment Problem Solving: Impaired Problem Solving Impairment: Functional basic Safety/Judgment: Impaired Comments: Poor awareness of deficits Brief Interview for Mental Status (BIMS) Repetition of Three  Words (First Attempt): 3 Temporal Orientation: Year: Correct Temporal Orientation: Month: Accurate within 5 days Temporal Orientation: Day: Correct Recall: "Sock": No, could not recall Recall: "Blue": Yes, after cueing ("a color") Recall: "Bed": No, could not recall BIMS Summary Score: 10 Sensation Sensation Light Touch: Appears Intact Hot/Cold: Appears Intact Proprioception: Impaired by gross assessment Coordination Gross Motor Movements are Fluid and Coordinated: No Fine Motor Movements are Fluid and Coordinated: No Coordination and Movement Description: grossly impaired due to strength, coordination, balance deficits Finger Nose Finger Test: grossly impaired 2/2 visual perceptual deficits Motor  Motor Motor:  Other (comment);Motor apraxia Motor - Discharge Observations: Grossly impaired coordination, balance, motor planning 2/2 R gaze and inattention Mobility  Bed Mobility Bed Mobility: Supine to Sit;Sit to Supine Supine to Sit: Supervision/Verbal cueing Sit to Supine: Supervision/Verbal cueing Transfers Sit to Stand: Contact Guard/Touching assist Stand to Sit: Contact Guard/Touching assist  Trunk/Postural Assessment  Cervical Assessment Cervical Assessment: Exceptions to [facility] (R head turn and flexed neck) Thoracic Assessment Thoracic Assessment: Exceptions to [facility] (kyphotic posture) Lumbar Assessment Lumbar Assessment: Exceptions to [facility] (posterior pelvic tilt) Postural Control Postural Control: Deficits on evaluation Righting Reactions: delayed Protective Responses: delayed  Balance Balance Balance Assessed: Yes Static Sitting Balance Static Sitting - Balance Support: Bilateral upper extremity supported;Feet supported Static Sitting - Level of Assistance: 5: Stand by assistance Dynamic Sitting Balance Dynamic Sitting - Balance Support: During functional activity Dynamic Sitting - Level of Assistance: 5: Stand by assistance Static Standing Balance Static Standing -  Balance Support: Bilateral upper extremity supported;During functional activity Static Standing - Level of Assistance: 5: Stand by assistance Dynamic Standing Balance Dynamic Standing - Balance Support: During functional activity;Bilateral upper extremity supported Dynamic Standing - Level of Assistance: 4: Min assist;3: Mod assist Extremity/Trunk Assessment RUE Assessment RUE Assessment: Within Functional Limits LUE Assessment LUE Assessment: Within Functional Limits   [name] 10/06/2021, 9:19 AM

## 2021-10-07 LAB — GLUCOSE, CAPILLARY
Glucose-Capillary: 122 mg/dL — ABNORMAL HIGH (ref 70–99)
Glucose-Capillary: 172 mg/dL — ABNORMAL HIGH (ref 70–99)
Glucose-Capillary: 220 mg/dL — ABNORMAL HIGH (ref 70–99)
Glucose-Capillary: 204 mg/dL — ABNORMAL HIGH (ref 70–99)

## 2021-10-07 LAB — BASIC METABOLIC PANEL
Anion gap: 11 (ref 5–15)
BUN: 7 mg/dL (ref 6–20)
CO2: 23 mmol/L (ref 22–32)
Calcium: 9.1 mg/dL (ref 8.9–10.3)
Chloride: 103 mmol/L (ref 98–111)
Creatinine, Ser: 0.99 mg/dL (ref 0.61–1.24)
GFR, Estimated: >60 mL/min (ref >60)
Glucose, Bld: 131 mg/dL — ABNORMAL HIGH (ref 70–99)
Potassium: 4.1 mmol/L (ref 3.5–5.1)
Sodium: 137 mmol/L (ref 135–145)

## 2021-10-07 MED ORDER — TAMSULOSIN HCL 0.4 MG PO CAPS
0.8000 mg | ORAL_CAPSULE | Freq: Every day | ORAL | Status: DC
  Administered 2021-10-07 – 2021-10-09 (×3): 0.8 mg via ORAL
  Filled 2021-10-07 (×3): qty 2

## 2021-10-07 MED ORDER — METFORMIN HCL 500 MG PO TABS
500.0000 mg | ORAL_TABLET | Freq: Every day | ORAL | Status: DC
  Administered 2021-10-08 – 2021-10-10 (×3): 500 mg via ORAL
  Filled 2021-10-07 (×3): qty 1

## 2021-10-07 NOTE — Progress Notes (Signed)
Speech Language Pathology Daily Session Note  Patient Details  Name: Shawn Austin MRN: [id number] Date of Birth: 1965-10-31  Today's Date: 10/07/2021 SLP Individual Time: 7619-5093 SLP Individual Time Calculation (min): 41 min  Short Term Goals: Week 3: SLP Short Term Goal 1 (Week 3): STG=LTG due to ELOS  Skilled Therapeutic Interventions: Skilled ST treatment focused on cognitive goals. Pt received semi-reclined in bed on arrival. Pt agreeable to tx however exhibited exhibited poor frustration tolerance throughout and one instance of verbal agitation by elevating vocal intensity in a yelling manner when discussing areas of support at discharge. It appears diet was advanced to GI soft. Pt reported tolerance and denied nausea at this time. Pt eagerly anticipating discharge TBD.  SLP facilitated session by re-administering the SLUMS to assess pt's cognitive linguistic skills. Pt scored a 14/30 which is below the normal limits of 27 or more out of 30. Pt scored 13/30 points during initial evaluation taken on 09/12/21. Pt presented with overall slow gains with cognitive-linguistic skills in the areas of intellectual awareness, problem solving, visual attention, and memory. SLP and pt discussed results. Continue to recommend 24 hour supervision, support with all ADLs/iADLs, and follow up therapy on outpatient basis. Patient was left in bed with alarm activated and immediate needs within reach at end of session. Continue per current plan of care.     Pain Pain Assessment Pain Scale: 0-10 Pain Score: 0-No pain  Therapy/Group: Individual Therapy  Tamala Ser 10/07/2021, 9:32 AM

## 2021-10-07 NOTE — Progress Notes (Signed)
Occupational Therapy Session Note  Patient Details  Name: Shawn Austin MRN: [id number] Date of Birth: Sep 25, 1965  Today's Date: 10/07/2021 OT Missed Time: 60 Minutes Missed Time Reason: Patient unwilling/refused to participate without medical reason  Pt adamantly refusing any participation in therapy. He reports "I'm going home today" despite being in the room and agreeing to Friday extension with his wife present. Will discuss next steps with the therapy and medical team.   Crissie Reese 10/07/2021, 6:50 AM

## 2021-10-07 NOTE — Progress Notes (Signed)
Had a quite night. Denies pain. Required I&O cath this morning. 500 mls of urine drained from bladder. Had a bowel movement with [place] on night shift. Denies pain. Safety maintained at all times.

## 2021-10-07 NOTE — Progress Notes (Signed)
Occupational Therapy Session Note  Patient Details  Name: Shawn Austin MRN: [id number] Date of Birth: 12-26-1965  Today's Date: 10/07/2021 OT Individual Time: 1310-1350 OT Individual Time Calculation (min): 40 min    Short Term Goals: Week 4:  OT Short Term Goal 1 (Week 4): STG= LTG d/t ELOS  Skilled Therapeutic Interventions/Progress Updates:    Pt received supine with no c/o pain, agreeable to OT session. He had a much brighter affect and was compliant with all OT interventions. He completed bed mobility to EOB with (S). He donned pants with CGA and shoes with mod A to tie. He stood from EOB with CGA. Ambulatory transfer to the w/c with min A for R lean. Cueing required for R lean throughout. He was taken to the therapy gym. From EOM pt completed reciprocal stepping activity onto a 5 in step using the RW with BUE support. He required min A overall. He was then placed to the L of a large column and instructed to use this as an anchor for his LUE. This was very successful and he required only CGA for 20 repetitions. He completed 100 ft of functional mobility to his w/c with CGA overall, 2 instances of more min A d/t R lean. He returned to his room and was left sitting up with all needs met, chair alarm set.    Therapy Documentation Precautions:  Precautions Precautions: Fall Precaution Comments: R gaze preference, R lateral lean Restrictions Weight Bearing Restrictions: No   Therapy/Group: Individual Therapy  [name] 10/07/2021, 1:48 PM

## 2021-10-07 NOTE — Progress Notes (Signed)
Physical Therapy Session Note  Patient Details  Name: Shawn Austin MRN: [id number] Date of Birth: January 11, 1966  Today's Date: 10/07/2021    and Today's Date: 10/07/2021 PT Missed Time: 60 Minutes Missed Time Reason: Patient unwilling to participate  Short Term Goals: Week 1:  PT Short Term Goal 1 (Week 1): Pt will perform sit to stand with LRAD and min A PT Short Term Goal 1 - Progress (Week 1): Met PT Short Term Goal 2 (Week 1): Pt will perform stand pivot transfer with LRAD and mod A PT Short Term Goal 2 - Progress (Week 1): Met PT Short Term Goal 3 (Week 1): Pt will ambulate 50 ft with LRAD and mod A PT Short Term Goal 3 - Progress (Week 1): Progressing toward goal Week 2:  PT Short Term Goal 1 (Week 2): Pt will perform sit to stand with LRAD and CGA PT Short Term Goal 1 - Progress (Week 2): Met PT Short Term Goal 2 (Week 2): Pt will perform stand pivot transfer with LRAD and min A PT Short Term Goal 2 - Progress (Week 2): Met PT Short Term Goal 3 (Week 2): Pt will ambulate 25 feet with mod A and LRAD PT Short Term Goal 3 - Progress (Week 2): Met PT Short Term Goal 5 (Week 2): Pt will perform sit to stand with SBA PT Short Term Goal 5 - Progress (Week 2): Not met Week 3:  PT Short Term Goal 1 (Week 3): Pt will perform stand pivot transfer with CGA PT Short Term Goal 1 - Progress (Week 3): Not met PT Short Term Goal 2 (Week 3): Pt will ambulate 25 ft with min A PT Short Term Goal 2 - Progress (Week 3): Met Week 4:  PT Short Term Goal 1 (Week 4): STG=LTG  Skilled Therapeutic Interventions/Progress Updates:      Therapy Documentation Precautions:  Precautions Precautions: Fall Precaution Comments: R gaze preference, R lateral lean Restrictions Weight Bearing Restrictions: No  Pt received semi-reclined in bed eating breakfast and requested time to eat. PT returned twice to see if patient was willing to participate in PT session and pt declined. Pt reports he wants to go home  today and does not intend to participate in physical therapy.   Therapy/Group: Individual Therapy  Verl Dicker Verl Dicker PT, DPT  10/07/2021, 7:36 AM

## 2021-10-07 NOTE — Progress Notes (Signed)
PROGRESS NOTE   Subjective/Complaints: Denies further nausea. Discharge date changed to 8/22. No new concerns.   LBM 8/21  ROS: Limited due to cognitive/behavioral    Objective:   No results found. Recent Labs    10/06/21 0604  WBC 8.1  HGB 11.4*  HCT 33.2*  PLT 435*     Recent Labs    10/06/21 0604 10/07/21 0554  NA 134* 137  K 3.7 4.1  CL 105 103  CO2 22 23  GLUCOSE 111* 131*  BUN 9 7  CREATININE 0.95 0.99  CALCIUM 8.4* 9.1      Intake/Output Summary (Last 24 hours) at 10/07/2021 0800 Last data filed at 10/07/2021 0600 Gross per 24 hour  Intake 717.32 ml  Output 1450 ml  Net -732.68 ml         Physical Exam: Vital Signs Blood pressure (!) 156/115, pulse 99, temperature 97.7 F (36.5 C), temperature source Oral, resp. rate 20, height 5\' 9"  (1.753 m), weight 82.9 kg, SpO2 100 %. Constitutional: No distress . Vital signs reviewed. HEENT: NCAT,  oral membranes moist, + white coating on tongue Neck: supple Cardiovascular: RRR without murmur. No JVD    Respiratory/Chest: CTA Bilaterally without wheezes or rales. Normal effort    GI/Abdomen: BS +, non-tender, non-distended GU:  foley was removed Ext: no clubbing, cyanosis, or edema Psych: pleasant Neuro:Speech clear. Right gaze preference.  Left lateral rectus weakness. Left central 7. Demonstrates almost a left inattention-improved. Follows commands. Altered Left finger to nose , decreased fine motor movement LLE. Strength 5/5 RUE and RLE and grossly 4 to 4+/5 LUE and LLE. Senses pain in all 4. No resting tone. Musculoskeletal: No joint swelling or tenderness noted, ROM normal  Assessment/Plan: 1. Functional deficits which require 3+ hours per day of interdisciplinary therapy in a comprehensive inpatient rehab setting. Physiatrist is providing close team supervision and 24 hour management of active medical problems listed below. Physiatrist and  rehab team continue to assess barriers to discharge/monitor patient progress toward functional and medical goals  Care Tool:  Bathing    Body parts bathed by patient: Right arm, Chest, Abdomen, Left arm, Front perineal area, Right upper leg, Left upper leg, Face, Right lower leg, Left lower leg, Buttocks   Body parts bathed by helper: Buttocks     Bathing assist Assist Level: Minimal Assistance - Patient > 75%     Upper Body Dressing/Undressing Upper body dressing   What is the patient wearing?: Pull over shirt    Upper body assist Assist Level: Supervision/Verbal cueing    Lower Body Dressing/Undressing Lower body dressing      What is the patient wearing?: Incontinence brief, Pants     Lower body assist Assist for lower body dressing: Minimal Assistance - Patient > 75%     Toileting Toileting    Toileting assist Assist for toileting: Minimal Assistance - Patient > 75%     Transfers Chair/bed transfer  Transfers assist     Chair/bed transfer assist level: Minimal Assistance - Patient > 75%     Locomotion Ambulation   Ambulation assist      Assist level: Minimal Assistance - Patient > 75% Assistive device:  Walker-rolling Max distance: 180 feet   Walk 10 feet activity   Assist     Assist level: Minimal Assistance - Patient > 75% Assistive device: Walker-rolling   Walk 50 feet activity   Assist    Assist level: Minimal Assistance - Patient > 75% Assistive device: Walker-rolling    Walk 150 feet activity   Assist Walk 150 feet activity did not occur: Safety/medical concerns  Assist level: Minimal Assistance - Patient > 75% Assistive device: Walker-rolling    Walk 10 feet on uneven surface  activity   Assist     Assist level: Maximal Assistance - Patient 25 - 49% Assistive device: Hand held assist, Other (comment) (handrail)   Wheelchair     Assist Is the patient using a wheelchair?: Yes Type of Wheelchair: Manual     Wheelchair assist level: Supervision/Verbal cueing Max wheelchair distance: 314 feet    Wheelchair 50 feet with 2 turns activity    Assist        Assist Level: Supervision/Verbal cueing   Wheelchair 150 feet activity     Assist      Assist Level: Supervision/Verbal cueing   Blood pressure (!) 156/115, pulse 99, temperature 97.7 F (36.5 C), temperature source Oral, resp. rate 20, height 5\' 9"  (1.753 m), weight 82.9 kg, SpO2 100 %.  Medical Problem List and Plan: 1. 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 and concern about potential neurological source. There appears to be new infarct or evolution of his right MCA stroke in the medial right frontal lobe. I spoke with Dr. 9/18 regarding the new infarct in right frontal lobe. Recommended bp support. Zio patch is off, and they will read. Will add IVF today since his po intake is poor. -DC IVF 8/21  advance diet, appears to be tolerating and ate 100% lunch 8/22 2.  Impaired mobility: continue Lovenox             -antiplatelet therapy: DAPT X 3 months followed by ASA 325 mg daily 3. Pain Management:  Tylenol prn.  4. Mood/Behavior/Sleep: LCSW to follow for evaluation and support.              --Trazodone prn for insomnia             -antipsychotic agents: N/A 5. Neuropsych/cognition: This patient is ?capable of making decisions on his own behalf. 6. Skin/Wound Care:  Routine pressure relief measures.  7. Hyponatremia: Na reviewed and is 132 on 8/17, repeat Monday 8. HTN: Monitor BP TID--avoid hypotension with Right M2 occlusion.             --continue Cozaar, Coreg and HCTZ  --Will decrease HCTZ to 12.5mg  due to AKI  -7/31  will increase HCTZ back to 25mg  daily  -8/11 last BP 106/73, he is intermittently high but will hold off on increasing antihypertensives for now to avoid hypotension, continue to monitor  -8/15 reduce HCTZ back to 12.5 due to frequent urination, start flomax 0.4mg   Intermittently elevated, continue to monitor,consider increase Cozzar if remains above goal Vitals:   10/06/21 1948 10/07/21 0357  BP: 133/84 (!) 156/115  Pulse: 86 99  Resp: 20 20  Temp: 98.5 F (36.9 C) 97.7 F (36.5 C)  SpO2: 100% 100%   8/20 hctz held yesterday d/t poor po intake and for "permissive" elevation to allow cerebral perfusion  9. T2DM: Hgb A1c- 8.1 (down from 12). Will continue to monitor BS ac/hs             continue Glipizide and metformin  -Well controlled, continue to monitor for now  -8/1 DC glipizide CBG (last 3)  Recent Labs    10/06/21 1655 10/06/21 2059 10/07/21 0623  GLUCAP 142* 242* 122*   Decreased metformin to 500mg  qd effective 8/19--observe today. May be able to dc entirely  8/20 will hold metformin for now until po picks up  8/22 restart metformin 500mg  QD   10. AKI  -Will decrease HCTZ to 12.5mg   -NS 50 ml/hr startedd 7/28, Cr reviewed and has normalized  -Will DC fluids 7/31, recheck labs on Wednesday 8/2  -Scr 1.09  8/15, encourage PO intake to prevent repeat AKI, HCTZ decreased back to 12.5mg   -Cr 1.01 8/17  8/20-IVF as above. Recheck labs tomorrow  8/21-DC IVF  8/22 Scr stable 0.99 8/22 11. HLD  -continue statin, heart healthy diet 12. Urinary incontinence  8/5- will order Timed voiding- q2 hours since cannot tell when going/needs to go or has any control- and cannot feel when wet  8/9 continence improving, continue timed voids  8/11 He had 1 dose oxybutynin yesterday, seem likely he had benefit and had continent episode, will increase oxybutynin to 5mg  TID, monitor PVR  8/15 Urinary retention last night and required IC, will decrease HCTZ and start Flomax  8/19 Urinary  retention requiring occ IC, ditropan stopped   -encourage to get out of bed to empty  8/20 CONTINUE as above   -ucx + 100k enterococcus --begin macrobid 100mg  bid  8/21 foley was started yesterday, DC and start voiding trial  8/22 continued urinary retention, increases flomax to 0.8mg  13. Constipation  -improved overall, senokot daily, PRN miralax  -LBM 8/21 14. Anemia, mild  -HGB stable at 11.4 8/21, follow as outpatient  -Stool occult reviewed and is negative 15. Gastroparesis: NGT ordered to decompress stomach. NPO. CT ordered to assess for stroke progression and is stable.   MRI per above 8/19-20 -NGT is out. Pt feels better in this regard  -will not reinsert NGT  -Advance diet to soft 16.Thrush  -Diflucan given, nystatin oral started    LOS:  26 days A FACE TO FACE EVALUATION WAS PERFORMED  Jennye Boroughs 10/07/2021, 8:00 AM

## 2021-10-08 LAB — GLUCOSE, CAPILLARY
Glucose-Capillary: 135 mg/dL — ABNORMAL HIGH (ref 70–99)
Glucose-Capillary: 149 mg/dL — ABNORMAL HIGH (ref 70–99)
Glucose-Capillary: 170 mg/dL — ABNORMAL HIGH (ref 70–99)
Glucose-Capillary: 157 mg/dL — ABNORMAL HIGH (ref 70–99)

## 2021-10-08 MED ORDER — BETHANECHOL CHLORIDE 25 MG PO TABS
25.0000 mg | ORAL_TABLET | Freq: Three times a day (TID) | ORAL | Status: DC
  Administered 2021-10-08 – 2021-10-10 (×6): 25 mg via ORAL
  Filled 2021-10-08 (×6): qty 1

## 2021-10-08 NOTE — Progress Notes (Signed)
Speech Language Pathology Daily Session Note  Patient Details  Name: Shawn Austin MRN: [id number] Date of Birth: Jun 19, 1965  Today's Date: 10/08/2021 SLP Individual Time: 0905-0930 SLP Individual Time Calculation (min): 25 min  Short Term Goals: Week 3: SLP Short Term Goal 1 (Week 3): STG=LTG due to ELOS  Skilled Therapeutic Interventions: Skilled ST treatment focused on cognitive goals. Pt was received supine in bed on arrival. Pt made minimal acknowledgement to therapist upon entry and initially resistant to participate but eventually agreeable with verbal encouragement. Breakfast tray was untouched at bedside. Pt was not interested and stated he wasn't hungry. Pt required extensive wait time to initiate and perform tasks including washing face and brushing teeth. Pt washed face with set-up A and initiated task following initial verbal cue within ~15 seconds. Pt required ~4 minutes to initiate brushing teeth following initial set-up. Pt was observed holding onto toothbrush with R hand however maintained eye gaze on right corner of room and had no reactions when verbally cued to begin task on several occasions. SLP attempted hand-over-hand x3 however pt resisted therapist. When SLP inquired, pt expressed "just give me a second" however would still not initiate task. He was eventually accepting of hand-over-hand following ~5 minutes with max A while SLP supported brushing motion for ~10 seconds and pt was then able to complete task with adequate thoroughness. Unclear if pt was internally distracted or uninterested. Pt typically would not respond to therapist with attempts to better understand pt's needs/preferences, or if he did, responses typically consisted of "I don't know." SLP provided mod A verbal.visual cues to locate TV on/off button on call bell. Patient was left in bed with alarm activated and immediate needs within reach at end of session. Continue per current plan of care.      Pain   None/denied  Therapy/Group: Individual Therapy  Tamala Ser 10/08/2021, 9:36 AM

## 2021-10-08 NOTE — Progress Notes (Addendum)
PROGRESS NOTE   Subjective/Complaints: Reports no further nausea. Continues to have urinary retention requiring IC.  Denies Pain.   LBM 8/22  ROS: Limited due to cognitive/behavioral    Objective:   No results found. Recent Labs    10/06/21 0604  WBC 8.1  HGB 11.4*  HCT 33.2*  PLT 435*     Recent Labs    10/06/21 0604 10/07/21 0554  NA 134* 137  K 3.7 4.1  CL 105 103  CO2 22 23  GLUCOSE 111* 131*  BUN 9 7  CREATININE 0.95 0.99  CALCIUM 8.4* 9.1      Intake/Output Summary (Last 24 hours) at 10/08/2021 0810 Last data filed at 10/08/2021 0518 Gross per 24 hour  Intake 651 ml  Output 1000 ml  Net -349 ml         Physical Exam: Vital Signs Blood pressure (!) 153/90, pulse 88, temperature 97.8 F (36.6 C), temperature source Oral, resp. rate 15, height 5\' 9"  (1.753 m), weight 82.9 kg, SpO2 100 %. Constitutional: No distress . Vital signs reviewed. HEENT: NCAT,  oral membranes moist, + white coating on tongue Neck: supple Cardiovascular: RRR without murmur. No JVD    Respiratory/Chest: CTA Bilaterally without wheezes or rales. Normal effort    GI/Abdomen: BS +, non-tender, non-distended,soft GU:  foley was removed Ext: no clubbing, cyanosis, or edema Psych: flat, limited verbal output today- nods or shakes head to most questions Neuro:Speech clear. Right gaze preference.  Left lateral rectus weakness. Left central 7. Demonstrates almost a left inattention-improved. Follows commands. Altered Left finger to nose , decreased fine motor movement LLE. Strength 5/5 RUE and RLE and grossly 4 to 4+/5 LUE and LLE. Senses pain in all 4.  Musculoskeletal: No joint swelling or tenderness noted, ROM normal  Assessment/Plan: 1. Functional deficits which require 3+ hours per day of interdisciplinary therapy in a comprehensive inpatient rehab setting. Physiatrist is providing close team supervision and 24 hour  management of active medical problems listed below. Physiatrist and rehab team continue to assess barriers to discharge/monitor patient progress toward functional and medical goals  Care Tool:  Bathing    Body parts bathed by patient: Right arm, Chest, Abdomen, Left arm, Front perineal area, Right upper leg, Left upper leg, Face, Right lower leg, Left lower leg, Buttocks   Body parts bathed by helper: Buttocks     Bathing assist Assist Level: Minimal Assistance - Patient > 75%     Upper Body Dressing/Undressing Upper body dressing   What is the patient wearing?: Pull over shirt    Upper body assist Assist Level: Supervision/Verbal cueing    Lower Body Dressing/Undressing Lower body dressing      What is the patient wearing?: Incontinence brief, Pants     Lower body assist Assist for lower body dressing: Minimal Assistance - Patient > 75%     Toileting Toileting    Toileting assist Assist for toileting: Minimal Assistance - Patient > 75%     Transfers Chair/bed transfer  Transfers assist     Chair/bed transfer assist level: Minimal Assistance - Patient > 75%     Locomotion Ambulation   Ambulation assist  Assist level: Minimal Assistance - Patient > 75% Assistive device: Walker-rolling Max distance: 180 feet   Walk 10 feet activity   Assist     Assist level: Minimal Assistance - Patient > 75% Assistive device: Walker-rolling   Walk 50 feet activity   Assist    Assist level: Minimal Assistance - Patient > 75% Assistive device: Walker-rolling    Walk 150 feet activity   Assist Walk 150 feet activity did not occur: Safety/medical concerns  Assist level: Minimal Assistance - Patient > 75% Assistive device: Walker-rolling    Walk 10 feet on uneven surface  activity   Assist     Assist level: Maximal Assistance - Patient 25 - 49% Assistive device: Hand held assist, Other (comment) (handrail)   Wheelchair     Assist Is the  patient using a wheelchair?: Yes Type of Wheelchair: Manual    Wheelchair assist level: Supervision/Verbal cueing Max wheelchair distance: 314 feet    Wheelchair 50 feet with 2 turns activity    Assist        Assist Level: Supervision/Verbal cueing   Wheelchair 150 feet activity     Assist      Assist Level: Supervision/Verbal cueing   Blood pressure (!) 153/90, pulse 88, temperature 97.8 F (36.6 C), temperature source Oral, resp. rate 15, height 5\' 9"  (1.753 m), weight 82.9 kg, SpO2 100 %.  Medical Problem List and Plan: 1. 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 and concern about potential neurological source. There appears to be new infarct or evolution of his right MCA stroke in the medial right frontal lobe. I spoke with Dr. 9/18 regarding the new infarct in right frontal lobe. Recommended bp support. Zio patch is off, and they will read. Will add IVF today since his po intake is poor. -DC IVF 8/21  advance diet, appears to be tolerating  -Team conference today 2.  Impaired mobility: continue Lovenox             -antiplatelet therapy: DAPT X 3 months followed by ASA 325 mg daily 3. Pain Management:  Tylenol prn.  4. Mood/Behavior/Sleep: LCSW to follow for evaluation and support.              --Trazodone prn for insomnia             -antipsychotic agents: N/A 5. Neuropsych/cognition: This patient is ?capable of making decisions on his own behalf. 6. Skin/Wound Care:  Routine pressure relief measures.  7. Hyponatremia: Na reviewed and is 132 on 8/17, repeat Monday 8. HTN: Monitor BP TID--avoid hypotension with Right M2 occlusion.             --continue Cozaar, Coreg  and HCTZ  --Will decrease HCTZ to 12.5mg  due to AKI  -7/31 will increase HCTZ back to 25mg  daily  -8/11 last BP 106/73, he is intermittently high but will hold off on increasing antihypertensives for now to avoid hypotension, continue to monitor  -8/15 reduce HCTZ back to 12.5 due to frequent urination, start flomax 0.4mg   Intermittently elevated, continue to monitor,consider increase Cozzar if remains above goal Vitals:   10/08/21  [id number] 10/08/21 0521  BP: (!) 181/97 (!) 153/90  Pulse: 89 88  Resp: 18 15  Temp: 97.8 F (36.6 C)   SpO2: 99% 100%   8/20 hctz held yesterday d/t poor po intake and for "permissive" elevation to allow cerebral perfusion  8/23 consider increased cozaar if BP remains elevated, continue to hold HCTZ, will try to avoid diuretic antihypertensives due to bladder dysfunction   9. T2DM: Hgb A1c- 8.1 (down from 12). Will continue to monitor BS ac/hs             continue Glipizide and metformin  -Well controlled, continue to monitor for now  -8/1 DC glipizide CBG (last 3)  Recent Labs    10/07/21 1654 10/07/21 2010 10/08/21 0602  GLUCAP 220* 204* 135*   Decreased metformin to 500mg  qd effective 8/19--observe today. May be able to dc entirely  8/20 will hold metformin for now until po picks up  8/22 restart metformin 500mg  QD  Monitor for response, CBG improved   10. AKI  -Will decrease HCTZ to 12.5mg   -NS 50 ml/hr startedd 7/28, Cr reviewed and has normalized  -Will DC fluids 7/31, recheck labs on Wednesday 8/2  -Scr 1.09  8/15, encourage PO intake to prevent repeat AKI, HCTZ decreased back to 12.5mg   -Cr 1.01 8/17  8/20-IVF as above. Recheck labs tomorrow  8/21-DC IVF  8/22 Scr stable 0.99 8/22 11. HLD  -continue statin, heart healthy diet 12. Urinary incontinence  8/5- will order Timed voiding- q2 hours since cannot tell when going/needs to go or has any control- and cannot feel when wet  8/9 continence improving, continue timed voids  8/11 He had 1  dose oxybutynin yesterday, seem likely he had benefit and had continent episode, will increase oxybutynin to 5mg  TID, monitor PVR  8/15 Urinary retention last night and required IC, will decrease HCTZ and start Flomax  8/19 Urinary retention requiring occ IC, ditropan stopped   -encourage to get out of bed to empty  8/20 CONTINUE as above   -ucx + 100k enterococcus --begin macrobid 100mg  bid  8/21 foley was started yesterday, DC and start voiding trial  8/22 continued urinary retention, increases flomax to 0.8mg   8/23 start urecholine  13. Constipation  -improved overall, senokot daily, PRN miralax  -LBM 8/22 14. Anemia, mild  -HGB stable at 11.4 8/21, follow as outpatient  -Stool occult reviewed and is negative 15. Gastroparesis: NGT ordered to decompress stomach. NPO. CT ordered to assess for stroke progression and is stable.   MRI per above 8/19-20 -NGT is out. Pt feels better in this regard  -will not reinsert NGT  -Advance diet to soft- Pt eating most of his meals 8/23 16.Thrush  -Diflucan given, nystatin oral started    LOS:  27 days A FACE TO FACE EVALUATION WAS PERFORMED  Jennye Boroughs 10/08/2021, 8:10 AM

## 2021-10-08 NOTE — Patient Care Conference (Signed)
Inpatient RehabilitationTeam Conference and Plan of Care Update Date: 10/08/2021   Time: 12:04 PM    Patient Name: Shawn Austin      Medical Record Number: [id number]  Date of Birth: April 25, 1965 Sex: Male         Room/Bed: 4W06C/4W06C-01 Payor Info: Payor: /    Admit Date/Time:  09/11/2021  4:02 PM  Primary Diagnosis:  Acute stroke of medulla oblongata [facility])  Hospital Problems: Principal Problem:   Acute stroke of medulla oblongata (HCC) Active Problems:   Diabetes mellitus (HCC)   Primary hypertension   Urinary incontinence    Expected Discharge Date: Expected Discharge Date: 10/10/21  Team Members Present: Physician leading conference: Dr. Fanny Dance Social Worker Present: Dossie Der, LCSW Nurse Present: Chana Bode, RN PT Present: Other (comment) Gaye Pollack, PT) OT Present: Jake Shark, OT SLP Present: Feliberto Gottron, SLP PPS Coordinator present : Fae Pippin, SLP     Current Status/Progress Goal Weekly Team Focus  Bowel/Bladder   Intemittent incontinence (Bladder). LBM 10/07/21. I&O cath nas needed.  Regain continence  Offer Toileting Q 2-4 hr and prn   Swallow/Nutrition/ Hydration   Bedside completed 8/18 - delayed cough with thin liquids via straw otherwise WFL. Recommended pt progress to dys 3/regular as tolerated. Currently on full liquids due to medical  no follow up recommended from an oropharyngeal standpoint; symptoms appear GI issues in nature      ADL's   Severe R gaze preference with R lean. Progressing to more consistent CGA with mobility but still has bouts of R LOB. CGA-(S) for ADLs  CGA overall  Visual perception/scanning, ADLs, d/c planning,, transfers, midline orientation   Mobility   superversion bed. CGA STS. Min A stand pivot. Gait RW min A. Steps x 12 (6 inch) with 2 HR's min A  supervision to min A  transfers, gait, pt/family education, balance, coordination   Communication             Safety/Cognition/ Behavioral Observations   mod-to-max A - making slow gains with limited carry over and suspect decreased motivation to participate  min-to-mod A (goals downgraded 8/15) - will likely need to consider additional downgrade  problem solving, memory, error awareness, visual scanning, insight, famliy education   Pain   Denies pain  Remain pain free  Assess shift and PRN   Skin   Skin intact  Prevent any skin breakdown  Assess Q shift and prn.     Discharge Planning:  Girlfriend did come in for hands on education on multiple days and feels comfortable with his care at DC. Plan DC Friday due to medical issues. DME arranged and have gotten charity for [facility]   Team Discussion: Patient requiring I+O caths; on macrobid with urecholine and flomax.  Decreased motivation and participation have improved.  Patient on target to meet rehab goals: yes, currently needs CGA for transfers with right lean and right gaze preference.  Needs min assist for ambulation up to 160' and completes steps with rails with CGA. Using step strategies without cues. Needs mod - max assist for cognition.   *See Care Plan and progress notes for long and short-term goals.   Revisions to Treatment Plan:  N/a  Teaching Needs: Safety, I+O catheterization, medications, secondary risk management, dietary modifications, transfers, etc  Current Barriers to Discharge: Decreased caregiver support and lack of insurance for follow up services  Possible Resolutions to Barriers: Family education [facility] and wellness appointment set OP follow up services recommended DME: RW. BSC, manual  w/c, urethral catheters     Medical Summary Current Status: gastroparesis, thrush, urinar retention, constipation, AKI, DM2, THN, R MCA CVA  Barriers to Discharge: Home enviroment access/layout;Medical stability;Incontinence  Barriers to Discharge Comments: gastroparesis, thrush, urinar retention, constipation, AKI, DM2, THN, R MCA CVA Possible Resolutions to  [employer] Focus: Monitor BP and adjust antihypertensive meds, add urocholine, monitor intake, advance diet   Continued Need for Acute Rehabilitation Level of Care: The patient requires daily medical management by a physician with specialized training in physical medicine and rehabilitation for the following reasons: Direction of a multidisciplinary physical rehabilitation program to maximize functional independence : Yes Medical management of patient stability for increased activity during participation in an intensive rehabilitation regime.: Yes Analysis of laboratory values and/or radiology reports with any subsequent need for medication adjustment and/or medical intervention. : Yes   I attest that I was present, lead the team conference, and concur with the assessment and plan of the team.   Chana Bode B 10/08/2021, 5:25 PM

## 2021-10-08 NOTE — Progress Notes (Signed)
Had a quite night. Incontinent of bladder. Required !&O cath this morning. VS stable. Denies pain. Safety maintained.

## 2021-10-08 NOTE — Progress Notes (Addendum)
Physical Therapy Session Note  Patient Details  Name: Shawn Austin MRN: [id number] Date of Birth: 1965/11/17  Today's Date: 10/08/2021 PT Individual Time: 3267-1245 PT Individual Time Calculation (min): 44 min & 31 min   Short Term Goals: Week 1:  PT Short Term Goal 1 (Week 1): Pt will perform sit to stand with LRAD and min A PT Short Term Goal 1 - Progress (Week 1): Met PT Short Term Goal 2 (Week 1): Pt will perform stand pivot transfer with LRAD and mod A PT Short Term Goal 2 - Progress (Week 1): Met PT Short Term Goal 3 (Week 1): Pt will ambulate 50 ft with LRAD and mod A PT Short Term Goal 3 - Progress (Week 1): Progressing toward goal Week 2:  PT Short Term Goal 1 (Week 2): Pt will perform sit to stand with LRAD and CGA PT Short Term Goal 1 - Progress (Week 2): Met PT Short Term Goal 2 (Week 2): Pt will perform stand pivot transfer with LRAD and min A PT Short Term Goal 2 - Progress (Week 2): Met PT Short Term Goal 3 (Week 2): Pt will ambulate 25 feet with mod A and LRAD PT Short Term Goal 3 - Progress (Week 2): Met PT Short Term Goal 5 (Week 2): Pt will perform sit to stand with SBA PT Short Term Goal 5 - Progress (Week 2): Not met Week 3:  PT Short Term Goal 1 (Week 3): Pt will perform stand pivot transfer with CGA PT Short Term Goal 1 - Progress (Week 3): Not met PT Short Term Goal 2 (Week 3): Pt will ambulate 25 ft with min A PT Short Term Goal 2 - Progress (Week 3): Met Week 4:  PT Short Term Goal 1 (Week 4): STG=LTG  Skilled Therapeutic Interventions/Progress Updates:      Therapy Documentation Precautions:  Precautions Precautions: Fall Precaution Comments: R gaze preference, R lateral lean Restrictions Weight Bearing Restrictions: No   Treatment Session 1:   Pt received seated in w/c at bedside and agreeable to PT session. Pt without pain complaints. Pt propelled w/c ~150 ft with supervision to dayroom. Pt ambulated 165 + 40 ft with RW with CGA to min A for  balance. Pt continues to require verbal cues for lateral weight shifting to the left with gait. Pt transported to main gym and participated in stair training x 12 with 2 HR's and CGA. Pt did not require verbal cues for LE sequencing with stair negotiation. Pt propelled w/c ~50 and transported remaining distance to room. Pt left seated in w/c at bedside with chair alarm on and all needs in reach with [facility] representative present.    Treatment Session 2:  Pt received seated in w/c at bedside and declines pain. Pt agreeable to PT session and transported by w/c to hallway for gait training. Pt CGA with STS with tactile cueing for anterior weight shift. Pt ambulated 90 ft x 2 with CGA/min A. Pt requires verbal and visual cueing to facilitate left lateral weight shift. Pt demonstrated improvement with turning during gait and required min A for sequencing. Pt reported need to utilize restroom and was transported to room. Pt performed stand pivot with mod A and no AD to toilet. Pt had bowel movement and dependent for peri-care in standing. Pt required CGA to SBA for static standing balance with verbal cues to weightshift left. Pt ambulated ~5 ft to w/c and left seated in w/c at bedside with chair alarm  on and all needs within reach.   Therapy/Group: Individual Therapy  Verl Dicker Verl Dicker PT, DPT  10/08/2021, 7:45 AM

## 2021-10-08 NOTE — Progress Notes (Signed)
Patient ID: Shawn Austin, male   DOB: 02/09/1966, 56 y.o.   MRN: [id number]  Met with pt and spoke with Shawn Austin-girlfriend to update regarding team conference progress toward his goals and discharge 8/25. Pt feels he is doing better this week compared to last week when he was throwing up. He still is having I & O caths and will give samples for this and MD has on medications regarding this. [facility] offer some charity home health follow up. Will work toward discharge Friday. 

## 2021-10-08 NOTE — Progress Notes (Signed)
Occupational Therapy Session Note  Patient Details  Name: Shawn Austin MRN: [id number] Date of Birth: 09-Dec-1965  Session 1  Today's Date: 10/08/2021 OT Individual Time: 8099-8338 OT Individual Time Calculation (min): 60 min   Session 2  Today's Date: 10/08/2021 OT Individual Time: 1330-1415 OT Individual Time Calculation (min): 45 min    Short Term Goals: Week 4:  OT Short Term Goal 1 (Week 4): STG= LTG d/t ELOS   Skilled Therapeutic Interventions/Progress Updates:  Session 1   Pt received supine with no c/o pain, agreeable to OT session. He was initially resistant but then agreeable to take a shower as it has been several days since he has washed up. He completed a stand pivot transfer to the w/c with 2 attempts and cueing for hand placement and L lean to combat R lean, but (S) overall. He completed an ambulatory transfer into the bathroom with CGA, mod cueing for correcting R lean and for neutral head turn/orientation. He completed UB bathing in the shower seated on a TTB with (S) overall. R lean present but no LOB. He required CGA for standing level LB bathing with cueing for thoroughness. He used the grab bar anteriorly for support. Reiterated that pt is not to stand in the shower at home since he has nothing stable for UE support and to use the [facility] without the bucket to wash peri areas seated. Following the shower he required increased time to dry off, poor initiation and requiring cueing to dry off each body part. He did demonstrate some improved insight into deficits by asking appropriate questions re timeline of continuing to improve from a balance perspective. He completed UB dressing independently. He donned socks, requiring cueing to use a figure 4 position instead of flexing forward. He did have a seated LOB but was able to recover. He returned to his w/c via an ambulatory transfer with CGA using the RW. He donned pants with CGA, requiring cueing for planting his LLE on the  ground to ensure proper weightshift and midline orientation. He was left sitting up in the w/c with all needs met, chair alarm set.    Session 2 Pt received sitting in the w/c with no c/o pain, he was agreeable to OT session and declined any bathroom needs. He was taken to the ADL apt where he worked on kitchen accessibility, visual scanning, functional reaching, and lateral stepping to challenge dynamic standing balance. He used the RW and the counter to laterally step between cabinets with increased time d/t motor planning deficits required. He required only min cueing initially to scan fully R/L and then was able to carryover without further cueing. He required only close (S) for balance during this activity, even when removing his BUE to open a cabinet door. He returned to the w/c for a seated rest break and engaged in appropriate reflection/evaluation of his performance with OT guiding conversation. He then completed a standing level IADL simulation task- folding towels and placing onto a hanger on a clothing rack. Performed to promote BUE coordination, visual scanning and attention to the L and standing balance statically. He required min A for intermittent R lean but overall CGA. He was successful at using his BUE and was able to scan midline and L with minimal cueing. He returned to his room. He was left sitting up in the w/c with all needs met, chair alarm set.      Therapy Documentation Precautions:  Precautions Precautions: Fall Precaution Comments: R gaze preference, R  lateral lean Restrictions Weight Bearing Restrictions: No  Therapy/Group: Individual Therapy  [name] 10/08/2021, 10:02 AM

## 2021-10-09 LAB — GLUCOSE, CAPILLARY
Glucose-Capillary: 121 mg/dL — ABNORMAL HIGH (ref 70–99)
Glucose-Capillary: 126 mg/dL — ABNORMAL HIGH (ref 70–99)
Glucose-Capillary: 134 mg/dL — ABNORMAL HIGH (ref 70–99)
Glucose-Capillary: 154 mg/dL — ABNORMAL HIGH (ref 70–99)

## 2021-10-09 MED ORDER — LOSARTAN POTASSIUM 50 MG PO TABS
100.0000 mg | ORAL_TABLET | Freq: Every day | ORAL | Status: DC
  Administered 2021-10-09: 100 mg via ORAL
  Filled 2021-10-09: qty 2

## 2021-10-09 NOTE — Progress Notes (Signed)
Physical Therapy Discharge Summary  Patient Details  Name: Shawn Austin MRN: [id number] Date of Birth: Dec 15, 1965  Date of Discharge from PT service:October 09, 2021  Patient has met 10 of 10 long term goals due to improved activity tolerance, improved balance, improved postural control, increased strength, ability to compensate for deficits, improved attention, improved awareness, and improved coordination.  Patient to discharge at  primarily w/c level (S) and is ambulatory with RW  level  CGA to min A .  Patient's S/O participated in family education and demonstrate safe techniques with guarding and mobility.    Recommendation:  Patient will benefit from ongoing skilled PT services in home health setting to continue to advance safe functional mobility, address ongoing impairments in strength, balance, coordination and activity tolerance, and minimize fall risk.  Equipment: RW, manual w/c   Reasons for discharge: treatment goals met and discharge from hospital  Patient/family agrees with progress made and goals achieved: Yes  PT Discharge Pain Interference Pain Interference Pain Effect on Sleep: 0. Does not apply - I have not had any pain or hurting in the past 5 days Pain Interference with Therapy Activities: 0. Does not apply - I have not received rehabilitationtherapy in the past 5 days Pain Interference with Day-to-Day Activities: 1. Rarely or not at all Cognition Overall Cognitive Status: Impaired/Different from baseline Arousal/Alertness: Awake/alert Orientation Level: Oriented to person;Oriented to place Memory: Impaired Awareness: Impaired Problem Solving: Impaired Safety/Judgment: Impaired Comments: Poor awareness of deficits Sensation Sensation Light Touch: Appears Intact Proprioception: Impaired by gross assessment (grossly impaired due to left inattention) Coordination Gross Motor Movements are Fluid and Coordinated: No Fine Motor Movements are Fluid and  Coordinated: No Coordination and Movement Description: grossly impaired due to strength, coordination, balance deficits Finger Nose Finger Test: grossly impaired 2/2 visual perceptual deficits Heel Shin Test: intact Motor  Motor Motor: Other (comment);Motor apraxia (grossly impaired due to left inattention, coordiantion and balance deficits) Motor - Discharge Observations: grossly impaired due to left inattention, coordiantion and balance deficits  Mobility Bed Mobility Bed Mobility: Rolling Right;Rolling Left;Sit to Supine;Supine to Sit Rolling Right: Supervision/verbal cueing Rolling Left: Supervision/Verbal cueing Supine to Sit: Supervision/Verbal cueing Sit to Supine: Supervision/Verbal cueing Transfers Transfers: Sit to Stand;Stand to Sit;Stand Pivot Transfers Sit to Stand: Contact Guard/Touching assist Stand to Sit: Contact Guard/Touching assist Stand Pivot Transfers: Contact Guard/Touching assist Stand Pivot Transfer Details: Verbal cues for precautions/safety;Verbal cues for technique;Verbal cues for safe use of DME/AE Transfer (Assistive device): Rolling walker Locomotion  Gait Ambulation: Yes Gait Assistance: Minimal Assistance - Patient > 75% Gait Distance (Feet): 160 Feet (feet) Assistive device: Rolling walker Gait Assistance Details: Verbal cues for precautions/safety;Visual cues/gestures for sequencing;Verbal cues for safe use of DME/AE Gait Assistance Details: verbal cues due to left inattention Gait Gait: Yes Gait Pattern: Impaired Gait Pattern: Decreased stance time - left;Shuffle;Lateral trunk lean to right Gait velocity: decreased Stairs / Additional Locomotion Stairs: Yes Stairs Assistance: Contact Guard/Touching assist Stair Management Technique: Two rails Number of Stairs: 12 Height of Stairs: 6 Ramp: Minimal Assistance - Patient >75% Curb: Nurse, mental health Mobility: Yes Wheelchair Assistance:  Chartered loss adjuster: Both upper extremities Wheelchair Parts Management: Supervision/cueing;Needs assistance Distance: 300 feet  Trunk/Postural Assessment  Cervical Assessment Cervical Assessment: Exceptions to [facility] (forward head) Thoracic Assessment Thoracic Assessment: Exceptions to [facility] (thoracic kyphosis) Lumbar Assessment Lumbar Assessment: Within Functional Limits Postural Control Postural Control: Deficits on evaluation (right lean due to left inattention)  Balance Balance Balance Assessed: Yes Static  Sitting Balance Static Sitting - Balance Support: Bilateral upper extremity supported;Feet supported Static Sitting - Level of Assistance: 5: Stand by assistance (SBA) Dynamic Sitting Balance Dynamic Sitting - Balance Support: During functional activity Dynamic Sitting - Level of Assistance: 5: Stand by assistance (CGA) Dynamic Sitting - Balance Activities: Lateral lean/weight shifting;Head control activities;Forward lean/weight shifting;Trunk control activities Static Standing Balance Static Standing - Balance Support: Bilateral upper extremity supported;During functional activity Static Standing - Level of Assistance: 5: Stand by assistance (CGA) Dynamic Standing Balance Dynamic Standing - Balance Support: During functional activity;Bilateral upper extremity supported Dynamic Standing - Level of Assistance: 4: Min assist Dynamic Standing - Balance Activities: Lateral lean/weight shifting;Reaching for objects Extremity Assessment  RLE Assessment RLE Assessment: Within Functional Limits General Strength Comments: grossly 4+/5 LLE Assessment LLE Assessment: Within Functional Limits General Strength Comments: grossly 4+/5   Tanja Port PT, DPT  10/09/2021, 1:21 PM

## 2021-10-09 NOTE — Plan of Care (Signed)
  Problem: RH Memory Goal: LTG Patient will use memory compensatory aids to (SLP) Description: LTG:  Patient will use memory compensatory aids to recall biographical/new, daily complex information with cues (SLP) Outcome: Not Met (add Reason)   Problem: RH Awareness Goal: LTG: Patient will demonstrate awareness during functional activites type of (SLP) Description: LTG: Patient will demonstrate awareness during functional activites type of (SLP) Outcome: Not Met (add Reason)   Problem: RH Problem Solving Goal: LTG Patient will demonstrate problem solving for (SLP) Description: LTG:  Patient will demonstrate problem solving for basic/complex daily situations with cues  (SLP) Outcome: Completed/Met   Problem: RH Memory Goal: LTG Patient will demonstrate ability for day to day (SLP) Description: LTG:   Patient will demonstrate ability for day to day recall/carryover during cognitive/linguistic activities with assist  (SLP) Outcome: Completed/Met   Problem: RH Attention Goal: LTG Patient will demonstrate this level of attention during functional activites (SLP) Description: LTG:  Patient will will demonstrate this level of attention during functional activites (SLP) Outcome: Completed/Met

## 2021-10-09 NOTE — Progress Notes (Signed)
Patient ID: Shawn Austin, male   DOB: [birth year], 56 y.o.   MRN: [id number]  Attempted to get charity home health for pt and due to his household income he would be responsible for 67% of the cost of each visit. They currently can not afford this and will need to decline services at this time. Pt will go home with a home exercise program and once can get medicaid can then get OP or HH therapies.

## 2021-10-09 NOTE — Progress Notes (Signed)
Physical Therapy Session Note  Patient Details  Name: Shawn Austin MRN: [id number] Date of Birth: [birth year]  Today's Date: 10/09/2021 PT Individual Time: 0916-1000 PT Individual Time Calculation (min): 44 min   Short Term Goals: Week 4:  PT Short Term Goal 1 (Week 4): STG=LTG  Skilled Therapeutic Interventions/Progress Updates: Pt presents supine in bed asleep and gradually arouses and agrees to therapy.  Pt transfers sup to sit w/ supervision.  Pt unable to locate glasses and spoke w/ OT re: new pair as it helps gait.  Pt performed sit to stand w/ heavy min A w/ list to right and forward.  Pt performed squat pivot bed > w/c w/ min A.  Pt wheeled to main gym for time conservation.  Pt performed sit to stand transfers w/ heavy min A and verbal cues for hand placement.  Pt performed multiple gait trials w/ RW and heavy min A especially w/ turns.  Pt lists to right and forward and tends to crowd right side of RW.  Pt returned to room and remained sitting in w/c for next therapy session.  Chair alarm and all needs in reach.     Therapy Documentation Precautions:  Precautions Precautions: Fall Precaution Comments: R gaze preference, R lateral lean Restrictions Weight Bearing Restrictions: No General:   Vital Signs:   Pain:no c/o      Therapy/Group: Individual Therapy  Lucio Edward 10/09/2021, 11:03 AM

## 2021-10-09 NOTE — Progress Notes (Signed)
Physical Therapy Session Note  Patient Details  Name: Shawn Austin MRN: [id number] Date of Birth: 1965-04-02  Today's Date: 10/09/2021 PT Individual Time: 3903-0092 PT Individual Time Calculation (min): 43 min   Short Term Goals: Week 1:  PT Short Term Goal 1 (Week 1): Pt will perform sit to stand with LRAD and min A PT Short Term Goal 1 - Progress (Week 1): Met PT Short Term Goal 2 (Week 1): Pt will perform stand pivot transfer with LRAD and mod A PT Short Term Goal 2 - Progress (Week 1): Met PT Short Term Goal 3 (Week 1): Pt will ambulate 50 ft with LRAD and mod A PT Short Term Goal 3 - Progress (Week 1): Progressing toward goal Week 2:  PT Short Term Goal 1 (Week 2): Pt will perform sit to stand with LRAD and CGA PT Short Term Goal 1 - Progress (Week 2): Met PT Short Term Goal 2 (Week 2): Pt will perform stand pivot transfer with LRAD and min A PT Short Term Goal 2 - Progress (Week 2): Met PT Short Term Goal 3 (Week 2): Pt will ambulate 25 feet with mod A and LRAD PT Short Term Goal 3 - Progress (Week 2): Met PT Short Term Goal 5 (Week 2): Pt will perform sit to stand with SBA PT Short Term Goal 5 - Progress (Week 2): Not met Week 3:  PT Short Term Goal 1 (Week 3): Pt will perform stand pivot transfer with CGA PT Short Term Goal 1 - Progress (Week 3): Not met PT Short Term Goal 2 (Week 3): Pt will ambulate 25 ft with min A PT Short Term Goal 2 - Progress (Week 3): Met Week 4:  PT Short Term Goal 1 (Week 4): STG=LTG  Skilled Therapeutic Interventions/Progress Updates:      Therapy Documentation Precautions:  Precautions Precautions: Fall Precaution Comments: R gaze preference, R lateral lean Restrictions Weight Bearing Restrictions: No  Pt received seated in w/c at bedside in care of OT. Pt agreeable to PT session and wearing right gaze blockers on glasses. Pt without reports of pain during session. PT assessed pain interference, sensation, coordination, balance and  strength. Pt requested to go outside for community integration. Pt transported by w/c to outside [facility] and participated in gait training on unlevel surfaces. Pt ambulated 150 ft with RW and CGA on unlevel paved surfaces outside. Pt propelled w/c on unlevel surfaces and requires (S) for safety and obstacle negotiation. Pt transported to room and left seated in w/c at bedside with chair alarm on and all needs in reach. Pt educated to call for assistance instead of independently getting up to mobilize as pt is at increased risk for falls.    Therapy/Group: Individual Therapy  Verl Dicker Verl Dicker PT, DPT  10/09/2021, 7:37 AM

## 2021-10-09 NOTE — Progress Notes (Signed)
PROGRESS NOTE   Subjective/Complaints: No new concerns today. He continues to have urinary retention. No additional concerns.   ROS: Limited due to cognitive/behavioral    Objective:   No results found. No results for input(s): "WBC", "HGB", "HCT", "PLT" in the last 72 hours.   Recent Labs    10/07/21 0554  NA 137  K 4.1  CL 103  CO2 23  GLUCOSE 131*  BUN 7  CREATININE 0.99  CALCIUM 9.1      Intake/Output Summary (Last 24 hours) at 10/09/2021 0751 Last data filed at 10/09/2021 0600 Gross per 24 hour  Intake 531 ml  Output 600 ml  Net -69 ml         Physical Exam: Vital Signs Blood pressure 133/83, pulse 93, temperature 97.6 F (36.4 C), temperature source Oral, resp. rate 15, height 5\' 9"  (1.753 m), weight 82.9 kg, SpO2 99 %. Constitutional: No distress . In bed.  Vital signs reviewed. HEENT: NCAT,  oral membranes moist, + white coating on tongue Neck: supple Cardiovascular: RRR without murmur. No JVD    Respiratory/Chest: CTA Bilaterally without wheezes or rales. Good air movement GI/Abdomen: BS +, non-tender, non-distended,soft Ext: no clubbing, cyanosis, or edema Psych: flat, limited verbal output today- nods or shakes head to most questions Neuro:Speech clear. Right gaze preference.  Left lateral rectus weakness. Left central 7. Demonstrates almost a left inattention-improved. Follows commands. Altered Left finger to nose , decreased fine motor movement LLE. Strength 5/5 RUE and RLE and grossly 4 to 4+/5 LUE and LLE. Senses pain in all 4.  Musculoskeletal: No joint swelling or tenderness noted, ROM normal  Assessment/Plan: 1. Functional deficits which require 3+ hours per day of interdisciplinary therapy in a comprehensive inpatient rehab setting. Physiatrist is providing close team supervision and 24 hour management of active medical problems listed below. Physiatrist and rehab team continue to  assess barriers to discharge/monitor patient progress toward functional and medical goals  Care Tool:  Bathing    Body parts bathed by patient: Right arm, Chest, Abdomen, Left arm, Front perineal area, Right upper leg, Left upper leg, Face, Right lower leg, Left lower leg, Buttocks   Body parts bathed by helper: Buttocks     Bathing assist Assist Level: Supervision/Verbal cueing (seated in shower)     Upper Body Dressing/Undressing Upper body dressing   What is the patient wearing?: Pull over shirt    Upper body assist Assist Level: Independent    Lower Body Dressing/Undressing Lower body dressing      What is the patient wearing?: Incontinence brief, Pants     Lower body assist Assist for lower body dressing: Contact Guard/Touching assist     Toileting Toileting    Toileting assist Assist for toileting: Contact Guard/Touching assist     Transfers Chair/bed transfer  Transfers assist     Chair/bed transfer assist level: Contact Guard/Touching assist     Locomotion Ambulation   Ambulation assist      Assist level: Minimal Assistance - Patient > 75% Assistive device: Walker-rolling Max distance: 180 feet   Walk 10 feet activity   Assist     Assist level: Minimal Assistance - Patient > 75%  Assistive device: Walker-rolling   Walk 50 feet activity   Assist    Assist level: Minimal Assistance - Patient > 75% Assistive device: Walker-rolling    Walk 150 feet activity   Assist Walk 150 feet activity did not occur: Safety/medical concerns  Assist level: Minimal Assistance - Patient > 75% Assistive device: Walker-rolling    Walk 10 feet on uneven surface  activity   Assist     Assist level: Maximal Assistance - Patient 25 - 49% Assistive device: Hand held assist, Other (comment) (handrail)   Wheelchair     Assist Is the patient using a wheelchair?: Yes Type of Wheelchair: Manual    Wheelchair assist level: Supervision/Verbal  cueing Max wheelchair distance: 314 feet    Wheelchair 50 feet with 2 turns activity    Assist        Assist Level: Supervision/Verbal cueing   Wheelchair 150 feet activity     Assist      Assist Level: Supervision/Verbal cueing   Blood pressure 133/83, pulse 93, temperature 97.6 F (36.4 C), temperature source Oral, resp. rate 15, height 5\' 9"  (1.753 m), weight 82.9 kg, SpO2 99 %.  Medical Problem List and Plan: 1. 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 and concern about potential neurological source. There appears to be new infarct or evolution of his right MCA stroke in the medial right frontal lobe. I spoke with Dr. Erlinda Hong regarding the new infarct in right frontal lobe. Recommended bp support. Zio patch is off, and they will read. Will add IVF today since his po intake is poor. -DC IVF 8/21  advance diet, appears to be tolerating   2.  Impaired mobility: continue Lovenox             -antiplatelet therapy: DAPT X 3 months followed by ASA 325 mg daily 3. Pain Management:  Tylenol prn.  4. Mood/Behavior/Sleep: LCSW to follow for evaluation and support.              --Trazodone prn for insomnia             -antipsychotic agents: N/A 5. Neuropsych/cognition: This patient is ?capable of making decisions on his own behalf. 6. Skin/Wound Care:  Routine pressure relief measures.  7. Hyponatremia: Na reviewed and is 132 on 8/17, repeat Monday 8. HTN: Monitor BP TID--avoid hypotension with Right M2 occlusion.             --continue Cozaar, Coreg and HCTZ  --Will decrease HCTZ to 12.5mg  due to AKI  -7/31 will increase HCTZ back to 25mg  daily  -8/11 last BP 106/73, he is  intermittently high but will hold off on increasing antihypertensives for now to avoid hypotension, continue to monitor  -8/15 reduce HCTZ back to 12.5 due to frequent urination, start flomax 0.4mg   Intermittently elevated, continue to monitor,consider increase Cozzar if remains above goal Vitals:   10/08/21 0521 10/09/21 0559  BP: (!) 153/90 133/83  Pulse: 88 93  Resp: 15 15  Temp:  97.6 F (36.4 C)  SpO2: 100% 99%   8/20 hctz held yesterday d/t poor po intake and for "  permissive" elevation to allow cerebral perfusion  8/23 consider increased cozaar if BP remains elevated, continue to hold HCTZ, will try to avoid diuretic antihypertensives due to bladder dysfunction   8/24 increase cozaar to 100mg  due to elevated BP  9. T2DM: Hgb A1c- 8.1 (down from 12). Will continue to monitor BS ac/hs             continue Glipizide and metformin  -Well controlled, continue to monitor for now  -8/1 DC glipizide CBG (last 3)  Recent Labs    10/08/21 1631 10/08/21 2133 10/09/21 0620  GLUCAP 170* 157* 121*   Decreased metformin to 500mg  qd effective 8/19--observe today. May be able to dc entirely  8/20 will hold metformin for now until po picks up  8/22 restart metformin 500mg  QD  8/24 CBG improved, continue to monitor   10. AKI  -Will decrease HCTZ to 12.5mg   -NS 50 ml/hr startedd 7/28, Cr reviewed and has normalized  -Will DC fluids 7/31, recheck labs on Wednesday 8/2  -Scr 1.09  8/15, encourage PO intake to prevent repeat AKI, HCTZ decreased back to 12.5mg   -Cr 1.01 8/17  8/20-IVF as above. Recheck labs tomorrow  8/21-DC IVF  8/22 Scr stable 0.99 8/22 11. HLD  -continue statin, heart healthy diet 12. Urinary incontinence  8/5- will order Timed voiding- q2 hours since cannot tell when going/needs to go or has any control- and cannot feel when wet  8/9 continence improving, continue timed voids  8/11 He had 1 dose oxybutynin yesterday, seem likely he had benefit and had continent  episode, will increase oxybutynin to 5mg  TID, monitor PVR  8/15 Urinary retention last night and required IC, will decrease HCTZ and start Flomax  8/19 Urinary retention requiring occ IC, ditropan stopped   -encourage to get out of bed to empty  8/20 CONTINUE as above   -ucx + 100k enterococcus --begin macrobid 100mg  bid  8/21 foley was started yesterday, DC and start voiding trial  8/22 continued urinary retention, increases flomax to 0.8mg   8/23 start urecholine   Continued incontinence, will likely need urology f/u as outpatient, I/O cath training today 13. Constipation  -improved overall, senokot daily, PRN miralax  -LBM 8/23 14. Anemia, mild  -HGB stable at 11.4 8/21, follow as outpatient  -Stool occult reviewed and is negative 15. Gastroparesis: NGT ordered to decompress stomach. NPO. CT ordered to assess for stroke progression and is stable.   MRI per above 8/19-20 -NGT is out. Pt feels better in this regard  -will not reinsert NGT  -Advance diet to soft- Pt eating most of his meals 8/23 16.Thrush  -Diflucan given, nystatin oral started    LOS:  28 days A FACE TO FACE EVALUATION WAS PERFORMED  9/23 10/09/2021, 7:51 AM

## 2021-10-09 NOTE — Plan of Care (Signed)
  Problem: RH Balance Goal: LTG: Patient will maintain dynamic sitting balance (OT) Description: LTG:  Patient will maintain dynamic sitting balance with assistance during activities of daily living (OT) Outcome: Completed/Met Goal: LTG Patient will maintain dynamic standing with ADLs (OT) Description: LTG:  Patient will maintain dynamic standing balance with assist during activities of daily living (OT)  Outcome: Completed/Met   Problem: Sit to Stand Goal: LTG:  Patient will perform sit to stand in prep for activites of daily living with assistance level (OT) Description: LTG:  Patient will perform sit to stand in prep for activites of daily living with assistance level (OT) Outcome: Completed/Met   Problem: RH Bathing Goal: LTG Patient will bathe all body parts with assist levels (OT) Description: LTG: Patient will bathe all body parts with assist levels (OT) Outcome: Completed/Met   Problem: RH Dressing Goal: LTG Patient will perform upper body dressing (OT) Description: LTG Patient will perform upper body dressing with assist, with/without cues (OT). Outcome: Completed/Met Goal: LTG Patient will perform lower body dressing w/assist (OT) Description: LTG: Patient will perform lower body dressing with assist, with/without cues in positioning using equipment (OT) Outcome: Completed/Met   Problem: RH Toileting Goal: LTG Patient will perform toileting task (3/3 steps) with assistance level (OT) Description: LTG: Patient will perform toileting task (3/3 steps) with assistance level (OT)  Outcome: Completed/Met   Problem: RH Toilet Transfers Goal: LTG Patient will perform toilet transfers w/assist (OT) Description: LTG: Patient will perform toilet transfers with assist, with/without cues using equipment (OT) Outcome: Completed/Met   Problem: RH Tub/Shower Transfers Goal: LTG Patient will perform tub/shower transfers w/assist (OT) Description: LTG: Patient will perform tub/shower  transfers with assist, with/without cues using equipment (OT) Outcome: Completed/Met   Problem: RH Memory Goal: LTG Patient will demonstrate ability for day to day recall/carry over during activities of daily living with assistance level (OT) Description: LTG:  Patient will demonstrate ability for day to day recall/carry over during activities of daily living with assistance level (OT). Outcome: Completed/Met   Problem: RH Attention Goal: LTG Patient will demonstrate this level of attention during functional activites (OT) Description: LTG:  Patient will demonstrate this level of attention during functional activites  (OT) Outcome: Completed/Met   Problem: RH Awareness Goal: LTG: Patient will demonstrate awareness during functional activites type of (OT) Description: LTG: Patient will demonstrate awareness during functional activites type of (OT) Outcome: Completed/Met

## 2021-10-09 NOTE — Progress Notes (Addendum)
Speech Language Pathology Discharge Summary  Patient Details  Name: Shawn Austin MRN: [id number] Date of Birth: 10-24-1965  Date of Discharge from SLP service:October 09, 2021  Today's Date: 10/09/2021 SLP Individual Time: 3557-3220 SLP Individual Time Calculation (min): 42 min  Skilled Therapeutic Interventions: Skilled ST treatment focused on cognitive goals. Pt was received in wheelchair with head resting on bedside table. Pt roused to min verbal stimuli and appeared in good spirits this date. SLP facilitated session by providing mod A semantic cues to implement internal memory strategies (i.e., repetition, association, visualization) to recall novel list of 5 items to locate in hospital gift shop. External memory compensations were minimally effective due to severity of visual deficits. Pt taken to hospital atrium where it was discovered gift shop was closed due to technical issues therefore spent remainder of session outside in courtyard area. Pt verbalized appreciation for the opportunity to go outside and spoke of his gratitude throughout session. Although unable to utilize gift shop space for recalling and locating items, pt was able to recall list following 10 minute delay with min A semantic cues. SLP utilized outdoor space for visual scanning task in which pt required min A to locate items (e.g., bench, pink flowers, umbrella, fountain, etc.) left of midline. Pt was able to alternate his attention between conversation with therapist and and visual scanning task within a moderately distracting environment. Pt was returned to room and left in wheelchair with alarm activated and immediate needs within reach at end of session. Continue per current plan of care.    Patient has met 3 of 5 long term goals.  Patient to discharge at overall Mod level.  Reasons goals not met: Severity of deficits, slower than anticipated progress   Clinical Impression/Discharge Summary: Pt has demonstrated slow  but functional progress towards cognitive-linguistic goals meeting 3 out of 5 long-term goals this admission. Patient is currently completing basic and functional cognitive tasks with at least mod A cues in regards to intellectual awareness, problem solving, recall, and visual scanning. Level of assistance appears to fluctuate with alertness and motivation. Pt/family education completed. Pt is currently consuming a GI soft diet with thin liquids and sup-to-min A for safe swallowing precautions and strategies, and following no straw precautions. Care partner is independent to provide the necessary physical and cognitive assistance at discharge. Recommend supervision and assistance with completion of all iADL tasks to include medication and financial management, in addition to ongoing ST intervention at next level of care in outpatient setting to maximize cognitive-linguistic function and functional independence.   Care Partner:  Caregiver Able to Provide Assistance: Yes  Type of Caregiver Assistance: Cognitive;Physical  Recommendation:  24 hour supervision/assistance;Outpatient SLP  Rationale for SLP Follow Up: Maximize cognitive function and independence;Reduce caregiver burden   Equipment: None   Reasons for discharge: Discharged from hospital   Patient/Family Agrees with Progress Made and Goals Achieved: Yes    Patty Sermons 10/09/2021, 12:58 PM

## 2021-10-09 NOTE — Progress Notes (Signed)
Slept very well. Denies pain. I&O cath one occurrence this morning. Remains incontinent of bladder. Had to be changed a couple of times through the shift. Safety maintained at all times.

## 2021-10-09 NOTE — Progress Notes (Signed)
Occupational Therapy Session Note  Patient Details  Name: Shawn Austin MRN: [id number] Date of Birth: Jun 29, 1965  Today's Date: 10/09/2021 OT Individual Time: 1145-1300 OT Individual Time Calculation (min): 75 min   Short Term Goals: Week 4:  OT Short Term Goal 1 (Week 4): STG= LTG d/t ELOS  Skilled Therapeutic Interventions/Progress Updates:    Pt greeted seated in wc but had made his way to the bathroom without assistance. OT and nurse tech entered at the same time and explained to pt the importance of calling for assistance due to high fall risk. Pt stated he had to go to the bathroom but verbalized understanding. Sit<>stand at [facility] over toilet with close supervision, but pt unable to void. Pt then agreeable to shower. Stand-pivot into shower with supervision, heavy use of grab bars and verbal cues for location of bench due to visual deficits. Bathing from tub bench with close supervision and min cues for thoroughness. Dressing tasks from sitting in wc at the sink with intermittent internal and external distractions requiring cues to maintain attention to task. Sit<>stand to pull up pants with CGA for balance when pulling up pants. OT set-up patients lunch tray in the middle of his tray. Patient with much improved visual scanning to locate items on tray at midline and only min cues for items past midline on the left. Pt handoff to PT for next therapy session.   Therapy Documentation Precautions:  Precautions Precautions: Fall Precaution Comments: R gaze preference, R lateral lean Restrictions Weight Bearing Restrictions: No Pain:  Denies pain   Therapy/Group: Individual Therapy  Mal Amabile 10/09/2021, 12:45 PM

## 2021-10-10 ENCOUNTER — Other Ambulatory Visit (HOSPITAL_COMMUNITY): Payer: Self-pay

## 2021-10-10 DIAGNOSIS — N39490 Overflow incontinence: Secondary | ICD-10-CM

## 2021-10-10 DIAGNOSIS — R414 Neurologic neglect syndrome: Secondary | ICD-10-CM

## 2021-10-10 LAB — GLUCOSE, CAPILLARY
Glucose-Capillary: 97 mg/dL (ref 70–99)
Glucose-Capillary: 136 mg/dL — ABNORMAL HIGH (ref 70–99)

## 2021-10-10 MED ORDER — SENNOSIDES-DOCUSATE SODIUM 8.6-50 MG PO TABS
2.0000 | ORAL_TABLET | Freq: Every day | ORAL | 0 refills | Status: AC
  Filled 2021-10-10: qty 60, 30d supply, fill #0

## 2021-10-10 MED ORDER — CHLORPROMAZINE HCL 25 MG PO TABS
12.5000 mg | ORAL_TABLET | Freq: Two times a day (BID) | ORAL | 0 refills | Status: AC
  Filled 2021-10-10: qty 30, 30d supply, fill #0

## 2021-10-10 MED ORDER — NYSTATIN 100000 UNIT/ML MT SUSP
5.0000 mL | Freq: Four times a day (QID) | OROMUCOSAL | 0 refills | Status: DC
  Filled 2021-10-10: qty 60, 3d supply, fill #0

## 2021-10-10 MED ORDER — LOSARTAN POTASSIUM 100 MG PO TABS
100.0000 mg | ORAL_TABLET | Freq: Every day | ORAL | 0 refills | Status: DC
  Filled 2021-10-10: qty 30, 30d supply, fill #0

## 2021-10-10 MED ORDER — NITROFURANTOIN MONOHYD MACRO 100 MG PO CAPS
100.0000 mg | ORAL_CAPSULE | Freq: Two times a day (BID) | ORAL | 0 refills | Status: DC
  Filled 2021-10-10: qty 5, 3d supply, fill #0

## 2021-10-10 MED ORDER — TAMSULOSIN HCL 0.4 MG PO CAPS
0.8000 mg | ORAL_CAPSULE | Freq: Every day | ORAL | 0 refills | Status: DC
  Filled 2021-10-10: qty 60, 30d supply, fill #0

## 2021-10-10 MED ORDER — ASPIRIN 325 MG PO TBEC: 325.0000 mg | DELAYED_RELEASE_TABLET | Freq: Every day | ORAL | 0 refills | Status: DC

## 2021-10-10 MED ORDER — BETHANECHOL CHLORIDE 25 MG PO TABS
25.0000 mg | ORAL_TABLET | Freq: Three times a day (TID) | ORAL | 0 refills | Status: DC
  Filled 2021-10-10: qty 90, 30d supply, fill #0

## 2021-10-10 MED ORDER — METFORMIN HCL 500 MG PO TABS
500.0000 mg | ORAL_TABLET | Freq: Every day | ORAL | 0 refills | Status: DC
  Filled 2021-10-10: qty 30, 30d supply, fill #0

## 2021-10-10 MED ORDER — CARVEDILOL 3.125 MG PO TABS
3.1250 mg | ORAL_TABLET | Freq: Two times a day (BID) | ORAL | 0 refills | Status: DC
  Filled 2021-10-10: qty 60, 30d supply, fill #0

## 2021-10-10 MED ORDER — ASPIRIN 325 MG PO TBEC
325.0000 mg | DELAYED_RELEASE_TABLET | Freq: Every day | ORAL | 0 refills | Status: DC
  Filled 2021-10-10: qty 30, 30d supply, fill #0

## 2021-10-10 MED ORDER — ATORVASTATIN CALCIUM 80 MG PO TABS
80.0000 mg | ORAL_TABLET | Freq: Every day | ORAL | 0 refills | Status: DC
  Filled 2021-10-10: qty 30, 30d supply, fill #0

## 2021-10-10 MED ORDER — CLOPIDOGREL BISULFATE 75 MG PO TABS
75.0000 mg | ORAL_TABLET | Freq: Every day | ORAL | 0 refills | Status: DC
  Filled 2021-10-10: qty 30, 30d supply, fill #0

## 2021-10-10 NOTE — Discharge Summary (Addendum)
Physician Discharge Summary  Patient ID: Shawn Austin MRN: [id number] DOB/AGE: 56-Feb-1967 56 y.o.  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 CONTRAST:  86mL GADAVIST GADOBUTROL 1 MMOL/ML IV SOLN COMPARISON:  09/09/2021 FINDINGS: Brain: Restricted diffusion in the medial right frontal lobe white matter (series 5, image 80), which is linear and measuring approximately 9 mm. The anterior portion of this lesion demonstrates an ADC correlate (series 6, image 31), while the more posterior portion of this lesion has a less convincing ADC correlate, possibly a combination of acute and subacute infarct. This is new from the 09/09/2021 exam. A small focus of restricted diffusion is also noted again in the dorsal lateral right medulla (series 5, image 54), without definite ADC correlate and decreased in size compared to prior exam. No acute hemorrhage, mass, mass effect, or midline shift. No hydrocephalus or extra-axial collection. Redemonstrated hemosiderin deposition in the right caudate head. T2 hyperintense signal in the periventricular white matter, likely the sequela of chronic small vessel ischemic disease. Remote lacunar infarcts in the bilateral basal ganglia Vascular: Normal arterial flow voids. Skull and upper cervical spine: Normal marrow signal. Sinuses/Orbits: Mucous  retention cysts and mucosal thickening in the ethmoid air cells. Mucous retention cyst in the left maxillary sinus. Mucosal thickening in the right maxillary sinus. Other: The mastoids are well aerated. IMPRESSION: 1. Acute and also likely subacute infarct in the medial right frontal lobe white matter. 2. Restricted diffusion is again noted in the dorsal lateral right medulla, without definite ADC correlate, possibly evolution of the prior infarct versus additional subacute infarct. These results will be called to the ordering clinician or representative by the Radiologist Assistant, and communication documented in the PACS or [employer]. Electronically Signed   By: Merilyn Baba M.D.   On: 10/04/2021 03:44   DG Abd 2 Views  Result Date: 10/03/2021 CLINICAL DATA:  Aspiration pneumonia.  Nausea and vomiting. EXAM: ABDOMEN - 2 VIEW COMPARISON:  10/02/2021 FINDINGS: Normal sized heart. Clear lung bases. Normal bowel gas pattern without free peritoneal air. Thoracic and lumbar spine degenerative changes and mild scoliosis. IMPRESSION: No acute abnormality. Electronically Signed   By: Claudie Revering M.D.   On: 10/03/2021 11:32   DG Chest 2 View  Result Date: 10/03/2021 CLINICAL DATA:  Aspiration pneumonia EXAM: CHEST - 2 VIEW COMPARISON:  None Available. FINDINGS: Normal mediastinum and cardiac silhouette. Normal pulmonary vasculature. No evidence of effusion, infiltrate, or pneumothorax. No acute bony abnormality. IMPRESSION: No acute cardiopulmonary process. Electronically Signed   By: Suzy Bouchard M.D.   On: 10/03/2021 11:28   CT HEAD WO CONTRAST (5MM)  Result Date: 10/03/2021 CLINICAL DATA:  Stroke, follow up; worsening of nausea. History of medullary stroke EXAM: CT HEAD WITHOUT CONTRAST TECHNIQUE: Contiguous axial images were obtained from the base of the skull through the vertex without intravenous contrast. RADIATION DOSE REDUCTION: This exam was performed according to the departmental  dose-optimization program which includes  automated exposure control, adjustment of the mA and/or kV according to patient size and/or use of iterative reconstruction technique. COMPARISON:  Recent CT and MR imaging FINDINGS: Brain: Subtle low density at the right aspect of the medulla corresponding to the infarction on MRI. Small right frontoparietal cortical infarcts are not well seen by CT. No acute intracranial hemorrhage. No new loss of gray-white differentiation. Chronic bilateral basal ganglia, thalamus, and adjacent white matter infarcts. No hydrocephalus or extra-axial collection. Vascular: No new finding. Skull: Calvarium is unremarkable. Sinuses/Orbits: No acute finding. Other: Mastoid air cells are clear. IMPRESSION: Recent small infarcts are better seen on the prior MRI. No acute intracranial hemorrhage or other acute abnormality. Electronically Signed   By: Macy Mis M.D.   On: 10/03/2021 08:33   DG Abd 1 View  Result Date: 10/02/2021 CLINICAL DATA:  NG tube placement EXAM: ABDOMEN - 1 VIEW COMPARISON:  None Available. FINDINGS: Enteric tube is within the stomach. Included lungs are clear. Visualized bowel gas pattern is unremarkable. IMPRESSION: Enteric tube is within stomach. Electronically Signed   By: Macy Mis M.D.   On: 10/02/2021 16:49   DG Abd 1 View  Result Date: 10/01/2021 CLINICAL DATA:  Nausea and vomiting EXAM: ABDOMEN - 1 VIEW COMPARISON:  None Available. FINDINGS: Scattered large and small bowel gas is noted. Stomach is distended with air. No free air is seen. No abnormal mass or abnormal calcifications are noted. No acute bony abnormality is seen. IMPRESSION: No acute abnormality noted. Electronically Signed   By: Inez Catalina M.D.   On: 10/01/2021 19:51   ECHO TEE  Result Date: 09/11/2021    TRANSESOPHOGEAL ECHO REPORT   Patient Name:   Shawn Austin Date of Exam: 09/11/2021 Medical Rec #:  [id number]        Height:       69.0 in Accession #:    [id number]        Weight:       185.0 lb Date of Birth:  1965/09/10        BSA:          1.999 m Patient Age:    56 years         BP:           122/80 mmHg Patient Gender: M                HR:           77 bpm. Exam Location:  Inpatient Procedure: Transesophageal Echo Indications:    Stroke  History:        Patient has prior history of Echocardiogram examinations.  Sonographer:    Bernadene Person RDCS Referring Phys: Pierre Part: The transesophogeal probe was passed without difficulty through the esophogus of the patient. Sedation performed by different physician. The patient's vital signs; including heart rate, blood pressure, and oxygen saturation; remained stable throughout the procedure. The patient developed no complications during the procedure. IMPRESSIONS  1. No source of embolus noted.  2. Left ventricular ejection fraction, by estimation, is 60 to 65%. The left ventricle has normal function. The left ventricle has no regional wall motion abnormalities. The left ventricular internal cavity size was mildly dilated. There is moderate left ventricular hypertrophy.  3. Right ventricular systolic function is normal. The right ventricular size is normal.  4. Left atrial size was mildly dilated. No left atrial/left atrial appendage thrombus was detected.  5. The mitral valve is normal in structure. No evidence  of mitral valve regurgitation. No evidence of mitral stenosis.  6. The aortic valve is normal in structure. Aortic valve regurgitation is not visualized. No aortic stenosis is present.  7. The inferior vena cava is normal in size with greater than 50% respiratory variability, suggesting right atrial pressure of 3 mmHg.  8. Agitated saline contrast bubble study was negative, with no evidence of any interatrial shunt. Conclusion(s)/Recommendation(s): Normal biventricular function without evidence of hemodynamically significant valvular heart disease. FINDINGS  Left Ventricle: Left ventricular ejection  fraction, by estimation, is 60 to 65%. The left ventricle has normal function. The left ventricle has no regional wall motion abnormalities. The left ventricular internal cavity size was mildly dilated. There is  moderate left ventricular hypertrophy. Right Ventricle: The right ventricular size is normal. No increase in right ventricular wall thickness. Right ventricular systolic function is normal. Left Atrium: Left atrial size was mildly dilated. No left atrial/left atrial appendage thrombus was detected. Right Atrium: Right atrial size was normal in size. Pericardium: There is no evidence of pericardial effusion. Mitral Valve: The mitral valve is normal in structure. No evidence of mitral valve regurgitation. No evidence of mitral valve stenosis. Tricuspid Valve: The tricuspid valve is normal in structure. Tricuspid valve regurgitation is not demonstrated. No evidence of tricuspid stenosis. Aortic Valve: The aortic valve is normal in structure. Aortic valve regurgitation is not visualized. No aortic stenosis is present. Pulmonic Valve: The pulmonic valve was normal in structure. Pulmonic valve regurgitation is not visualized. No evidence of pulmonic stenosis. Aorta: The aortic root is normal in size and structure. Venous: The inferior vena cava is normal in size with greater than 50% respiratory variability, suggesting right atrial pressure of 3 mmHg. IAS/Shunts: No atrial level shunt detected by color flow Doppler. Agitated saline contrast bubble study was negative, with no evidence of any interatrial shunt. Additional Comments: No source of embolus noted. Jenkins Rouge MD Electronically signed by Jenkins Rouge MD Signature Date/Time: 09/11/2021/8:33:06 AM    Final     Labs:  Basic Metabolic Panel:    Latest Ref Rng & Units 10/07/2021    5:54 AM 10/06/2021    6:04 AM 10/02/2021    3:53 PM  BMP  Glucose 70 - 99 mg/dL 131  111  124   BUN 6 - 20 mg/dL 7  9  13    Creatinine 0.61 - 1.24 mg/dL 0.99  0.95  1.01    Sodium 135 - 145 mmol/L 137  134  132   Potassium 3.5 - 5.1 mmol/L 4.1  3.7  4.2   Chloride 98 - 111 mmol/L 103  105  102   CO2 22 - 32 mmol/L 23  22  22    Calcium 8.9 - 10.3 mg/dL 9.1  8.4  9.0      CBC:    Latest Ref Rng & Units 10/07/2021    5:54 AM 10/06/2021    6:04 AM 10/02/2021    3:53 PM  BMP  Glucose 70 - 99 mg/dL 131  111  124   BUN 6 - 20 mg/dL 7  9  13    Creatinine 0.61 - 1.24 mg/dL 0.99  0.95  1.01   Sodium 135 - 145 mmol/L 137  134  132   Potassium 3.5 - 5.1 mmol/L 4.1  3.7  4.2   Chloride 98 - 111 mmol/L 103  105  102   CO2 22 - 32 mmol/L 23  22  22    Calcium 8.9 - 10.3 mg/dL 9.1  8.4  9.0      CBG: Recent Labs  Lab 10/09/21 1155 10/09/21 1654 10/09/21 2057 10/10/21 0614 10/10/21 1137  GLUCAP 126* 134* 154* 97 136*    Brief HPI:   Shawn Austin is a 56 y.o. male with history of HTN, T2DM, DDD who was admitted on 09/01/21 with reports of leaning to the left, falls slurred speech as well as eye deviation to the right times few days duration.  CTA head showed diminutive left-ACA with concerns of occlusion, moderate stenosis of distal left M1 and proximal right M2 segment.  MRI brain showed small acute ischemic area and dorsal lateral right middle lower lung with chronic ischemic microangiopathy.  Stroke felt to be due to small vessel disease and Dr. Pearlean Brownie recommended DAPT x3 followed by aspirin alone.  The patient continued to have issues with labile blood pressures and BP meds were adjusted.    While awaiting rehab bed, patient developed perseveration with apraxia LUE and worsening of balance as well as visual deficits on 07/25.  MRI brain repeated and showed few new and well acute infarcts in right perirolandic frontoparietal region with mild increase in size of right medullary infarct.  Dr. Roda Shutters felt that new right-MCA stroke concerning for cardioembolic patent.  TEE was negative for thrombus, effusion no IA.  Zio patch was placed to management of for any  arrhythmias.  ASA was increased to 325 mg with plans for DAPT x3 months followed by aspirin alone.  He also recommended avoiding low BP given right M2 occlusion.  Therapy was working with patient who continued to be limited by weakness, ataxia, apraxia, visual deficits as well as balance deficits.  CIR was recommended due to functional decline.   Hospital Course: MYKLE PASCUA was admitted to rehab 09/11/2021 for inpatient therapies to consist of PT, ST and OT at least three hours five days a week. Past admission physiatrist, therapy team and rehab RN have worked together to provide customized collaborative inpatient rehab. He was mainteined on ASA/Plavix during the stay.  Follow-up CBC shows H&H and platelets to be stable.  Blood pressures were monitored on TID basis and and he was initially maintained on Cozaar, Coreg and HCTZ.  He was noted to have issues with urgency as well as incontinence.  HCTZ was decreased and tapered off.  Flomax 0.4 mg was added to help improve voiding function in addition to Ditropan to help decrease urgency.  However he developed urinary retention therefore Ditropan was discontinued.  He required in and out catheterization and was started on toileting program.  His voiding function has improved however he continues to have volumes up to 300 cc once a day and was advised to get at least once a day at discharge to help fully empty bladder.  Serial check of electrolytes showed AKI and he was treated with IV fluids for hydration.  With discontinuation of HCTZ a less IV fluids his renal status have normalized.  He continues on heart healthy diet and is tolerating this.  His diabetes has been monitored with ac/hs CBG checks and SSI was use prn for tighter BS control.  Blood sugars were noted to be tightly controlled and trending down therefore glipizide was discontinued.  Blood sugars are well controlled on metformin 500 mg once a day.  He has been educated on heart healthy/carb  modified diet and importance of compliance.  Hospital course significant for development of progressive nausea without dizziness on 08/17.  Thorazine was added per neurology  input and MRI brain repeated showing acute and likely subacute infarct in medial right frontal lobe as well as possible evolution of prior dorsal right medullary infarct. Neurology recommends DAPT X 3 months followed by ASA. Zio patch was d/c and sent off for read. His nausea has resolved but he continues to be limited by visual deficits wiith ataxia and apraxia. He has progressed to min assist and HEP given due lack of insurance and inability to pay for follow up therapy out of pocket. He was set with [facility] for medication assistance as well as [facility] for PCP. Recommend outpatient PT, OT and ST once Medicaid/orange card available.    Rehab course: During patient's stay in rehab weekly team conferences were held to monitor patient's progress, set goals and discuss barriers to discharge. At admission, patient required mod assist of ADL tasks and max assist with mobility. He exhibited cognitive linguistic deficits affecting attention, basic problem solving, recall, awareness and has SLUMS score 13/30.He  has had improvement in activity tolerance, balance, postural control as well as ability to compensate for deficits. He is able to complete ADL tasks with CGA to min assist. He requires CGA for transfer and min assist to ambulate 160' with RW and cues for safety due to left inattention. He requires min assist for swallow precautions and mod cues for functional and basic cognitive tasks. Level of assistance reported to fluctuate depending on alertness and motivation. Family education has been completed.     Disposition: Home   Diet: Heart healthy/diabetic.   Special Instructions: Need to toilet every 3-4 hours to empty bladder.  Cancel appointment in [place] if patient able to get referral to urology in [place] by  PCP. 3.   Current recommendation are for ASA 325 mg w/Plavix for 3 months followed by ASA alone but will need to follow up with neurology to confirm end date and further recommendations.   4. Recommend follow up outpatient PT,OT and ST once insurance available.    Discharge Instructions     Ambulatory referral to Cardiology   Complete by: As directed    Needs 30 day event monitor for stroke work up. Discharge date 08/23.   Ambulatory referral to Neurology   Complete by: As directed    An appointment is requested in approximately:  4 weeks   Ambulatory referral to Physical Medicine Rehab   Complete by: As directed       Allergies as of 10/10/2021   No Known Allergies/      Medication List     STOP taking these medications    acetaminophen 325 MG tablet Commonly known as: TYLENOL   glipiZIDE 5 MG tablet Commonly known as: GLUCOTROL   hydrochlorothiazide 25 MG tablet Commonly known as: HYDRODIURIL   insulin aspart 100 UNIT/ML injection Commonly known as: novoLOG       TAKE these medications    aspirin EC 325 MG tablet Take 1 tablet (325 mg total) by mouth daily. What changed: additional instructions   atorvastatin 80 MG tablet Commonly known as: LIPITOR Take 1 tablet (80 mg total) by mouth daily.   bethanechol 25 MG tablet Commonly known as: URECHOLINE Take 1 tablet (25 mg total) by mouth 3 (three) times daily.   carvedilol 3.125 MG tablet Commonly known as: COREG Take 1 tablet (3.125 mg total) by mouth 2 (two) times daily with a meal.   chlorproMAZINE 25 MG tablet Commonly known as: THORAZINE Take 0.5 tablets (12.5 mg total) by mouth 2 (  two) times daily.   clopidogrel 75 MG tablet Commonly known as: PLAVIX Take 1 tablet (75 mg total) by mouth daily. (Take aspirin 325 mg daily and clopidogrel daily for 3 months after which, patient will continue aspirin 325 mg alone) What changed: additional instructions Notes to patient: Need to follow up with  neurology for final decision on Aspirin and Plavix use.    losartan 100 MG tablet Commonly known as: COZAAR Take 1 tablet (100 mg total) by mouth at bedtime. What changed:  medication strength how much to take   metFORMIN 500 MG tablet Commonly known as: GLUCOPHAGE Take 1 tablet (500 mg total) by mouth daily with breakfast. Start taking on: October 11, 2021 What changed: when to take this   nitrofurantoin (macrocrystal-monohydrate) 100 MG capsule Commonly known as: MACROBID Take 1 capsule (100 mg total) by mouth every 12 (twelve) hours.   nystatin 100000 UNIT/ML suspension Commonly known as: MYCOSTATIN Take 5 mLs (500,000 Units total) by mouth 4 (four) times daily.   Senexon-S 8.6-50 MG tablet Generic drug: senna-docusate Take 2 tablets by mouth daily. Start taking on: October 11, 2021   tamsulosin 0.4 MG Caps capsule Commonly known as: FLOMAX Take 2 capsules (0.8 mg total) by mouth daily after supper.        Follow-up Information     [facility]. Schedule an appointment as soon as possible for a visit in 1 month(s).   Specialty: Neurology Why: stroke clinic Contact information: [address] [place] [phone]        Ladell Pier, MD Follow up on 10/13/2021.   Specialty: Internal Medicine Why: Be there at 2:15 pm for 2:30 appointment and referral to Urology for neurogenic bladder. Contact information: [place] [address] [phone]         Jennye Boroughs, MD Follow up.   Specialty: Physical Medicine and Rehabilitation Why: office will call you with follow up appointment Contact information: [address] [place] [address] [phone]         [facility] Follow up.   Why: You will need to get a referral for appointment for evaluation of neurogenic bladder after you get a discount card. Contact  information: [facility] Follow up on 10/15/2021.   Why: Appointment with Dr. Pedro Earls there at 1:45 for 2 pm appointment for work up of neurogenic bladder. Contact information: [address] [employer] [phone]                Signed: Bary Leriche 10/10/2021, 12:00 PM

## 2021-10-10 NOTE — Progress Notes (Signed)
Patient ID: Shawn Austin, male   DOB: 1965-08-24, 56 y.o.   MRN: [id number]  According to Deborah-Care Coordinator pt is voiding needs to be tolieted and he can empty. Pt does not want to be cathed at discharge either. He feels he will do better at home. Will hold on cath and referral to [facility]

## 2021-10-10 NOTE — Progress Notes (Signed)
Inpatient Rehabilitation Care Coordinator Discharge Note   Patient Details  Name: Shawn Austin MRN: [id number] Date of Birth: December 12, 1965   Discharge location: HOME WITH GIRLFRIEND AND COUSIN WHO CAN PROVIDE 24/7 CARE  Length of Stay: 75 DAYS  Discharge activity level: MIN-CGA LEVEL  Home/community participation: ACTIVE  Patient response EU:MPNTIR Literacy - How often do you need to have someone help you when you read instructions, pamphlets, or other written material from your doctor or pharmacy?: Never  Patient response WE:RXVQMG Isolation - How often do you feel lonely or isolated from those around you?: Sometimes  Services provided included: MD, RD, PT, OT, SLP, RN, CM, TR, Pharmacy, Neuropsych, SW  Financial Services:  Field seismologist Utilized: Other (Comment) (MEDICAID PENDING)    Choices offered to/list presented to: PT AND GIRLFRIEND  Follow-up services arranged:  DME, Patient/Family has no preference for HH/DME agencies      DME : [facility], [facility] AND 3 IN 1  SAMPLES OF CATH'S GIVEN TO PT AND REFERRAL MADE TO [facility] MATCH GIVEN FOR ASSISTANCE WITH PRESCRIPTIONS. FOLLOW UP APPOINTMENT AT [facility] 8/28 @ 2:30 PM. WILL ASSIST WITH ORANGE CARD AND MEDICAID. ACCORDING TO DEBORAH-CARE COORDINATOR PT IS VOIDING AND NEEDS TO GET UP AND TOILET TO BE ABLE TO EMPTY SAMPLE CATH'S GIVEN TO PATIENT AND GIRLFREIND  Patient response to transportation need: Is the patient able to respond to transportation needs?: Yes In the past 12 months, has lack of transportation kept you from medical appointments or from getting medications?: No In the past 12 months, has lack of transportation kept you from meetings, work, or from getting things needed for daily living?: No    Comments (or additional information): KATINA WS HERE MULTIPLE DAYS FOR HANDS ON EDUCATION AND COMFORTABLE WITH CARE AT HOME.   Patient/Family verbalized  understanding of follow-up arrangements:  Yes  Individual responsible for coordination of the follow-up plan: KATINA- [phone]  Confirmed correct DME delivered: Lucy Chris 10/10/2021    Lucy Chris

## 2021-10-10 NOTE — Progress Notes (Signed)
PROGRESS NOTE   Subjective/Complaints: No new concerns today. Plan to discharge today.  We discussed I/O cath for urinary retention.  Will ask nursing today for additional I&O cath training before he goes home.   ROS: Limited due to cognitive/behavioral    Objective:   No results found. No results for input(s): "WBC", "HGB", "HCT", "PLT" in the last 72 hours.   No results for input(s): "NA", "K", "CL", "CO2", "GLUCOSE", "BUN", "CREATININE", "CALCIUM" in the last 72 hours.    Intake/Output Summary (Last 24 hours) at 10/10/2021 0918 Last data filed at 10/09/2021 2000 Gross per 24 hour  Intake 437 ml  Output 0 ml  Net 437 ml         Physical Exam: Vital Signs Blood pressure (!) 150/90, pulse 80, temperature 98.4 F (36.9 C), resp. rate 15, height 5\' 9"  (1.753 m), weight 82.9 kg, SpO2 99 %. Constitutional: No distress . In bed.  Vital signs reviewed. HEENT: NCAT,  oral membranes moist Neck: supple Cardiovascular: RRR without murmur. No JVD    Respiratory/Chest: CTA Bilaterally without wheezes or rales. Good air movement, normal rate GI/Abdomen: BS +, non-tender, non-distended,soft Ext: no clubbing, cyanosis, or edema Psych: flat, limited verbal output nods or shakes head to most questions Neuro:Speech clear. Right gaze preference.  Left lateral rectus weakness. Left central 7. Demonstrates almost a left inattention-improved. Follows commands. Altered Left finger to nose , decreased fine motor movement LLE. Strength 5/5 RUE and RLE and grossly 4 to 4+/5 LUE and LLE. Senses pain in all 4.  Musculoskeletal: No joint swelling or tenderness noted, ROM normal  Assessment/Plan: 1. Functional deficits which require 3+ hours per day of interdisciplinary therapy in a comprehensive inpatient rehab setting. Physiatrist is providing close team supervision and 24 hour management of active medical problems listed below. Physiatrist  and rehab team continue to assess barriers to discharge/monitor patient progress toward functional and medical goals  Care Tool:  Bathing    Body parts bathed by patient: Right arm, Chest, Abdomen, Left arm, Front perineal area, Right upper leg, Left upper leg, Face, Right lower leg, Left lower leg, Buttocks   Body parts bathed by helper: Buttocks     Bathing assist Assist Level: Supervision/Verbal cueing (seated in shower)     Upper Body Dressing/Undressing Upper body dressing   What is the patient wearing?: Pull over shirt    Upper body assist Assist Level: Independent    Lower Body Dressing/Undressing Lower body dressing      What is the patient wearing?: Incontinence brief, Pants     Lower body assist Assist for lower body dressing: Contact Guard/Touching assist     Toileting Toileting    Toileting assist Assist for toileting: Contact Guard/Touching assist     Transfers Chair/bed transfer  Transfers assist     Chair/bed transfer assist level: Minimal Assistance - Patient > 75%     Locomotion Ambulation   Ambulation assist      Assist level: Contact Guard/Touching assist Assistive device: Walker-rolling Max distance: 150 feet   Walk 10 feet activity   Assist     Assist level: Contact Guard/Touching assist Assistive device: Walker-rolling   Walk 50 feet  activity   Assist    Assist level: Contact Guard/Touching assist Assistive device: Walker-rolling    Walk 150 feet activity   Assist Walk 150 feet activity did not occur: Safety/medical concerns  Assist level: Contact Guard/Touching assist Assistive device: Walker-rolling    Walk 10 feet on uneven surface  activity   Assist     Assist level: Contact Guard/Touching assist Assistive device: Walker-rolling   Wheelchair     Assist Is the patient using a wheelchair?: Yes Type of Wheelchair: Manual    Wheelchair assist level: Supervision/Verbal cueing Max wheelchair  distance: 314 feet    Wheelchair 50 feet with 2 turns activity    Assist        Assist Level: Supervision/Verbal cueing   Wheelchair 150 feet activity     Assist      Assist Level: Supervision/Verbal cueing   Blood pressure (!) 150/90, pulse 80, temperature 98.4 F (36.9 C), resp. rate 15, height 5\' 9"  (1.753 m), weight 82.9 kg, SpO2 99 %.  Medical Problem List and Plan: 1. 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 and concern about potential neurological source. There appears to be new infarct or evolution of his right MCA stroke in the medial right frontal lobe. I spoke with Dr. Erlinda Hong regarding the new infarct in right frontal lobe. Recommended bp support. Zio patch is off, and they will read. Will add IVF today since his po intake is poor. -DC IVF 8/21  advance diet, appears to be tolerating   2.  Impaired mobility: continue Lovenox             -antiplatelet therapy: DAPT X 3 months followed by ASA 325 mg daily 3. Pain Management:  Tylenol prn.  4. Mood/Behavior/Sleep: LCSW to follow for evaluation and support.              --Trazodone prn for insomnia             -antipsychotic agents: N/A 5. Neuropsych/cognition: This patient is ?capable of making decisions on his own behalf. 6. Skin/Wound Care:  Routine pressure relief measures.  7. Hyponatremia: Na reviewed and is 132 on 8/17, repeat Monday 8. HTN: Monitor BP TID--avoid hypotension with Right M2 occlusion.             --continue Cozaar, Coreg and HCTZ  --Will decrease HCTZ to 12.5mg  due to AKI  -7/31 will increase HCTZ back to 25mg  daily  -8/11 last BP 106/73, he is intermittently high but will hold off on increasing  antihypertensives for now to avoid hypotension, continue to monitor  -8/15 reduce HCTZ back to 12.5 due to frequent urination, start flomax 0.4mg   Intermittently elevated, continue to monitor,consider increase Cozzar if remains above goal Vitals:   10/09/21 1944 10/10/21 0353  BP: (!) 135/93 (!) 150/90  Pulse: 94 80  Resp: 15 15  Temp: 98.2 F (36.8 C) 98.4 F (36.9 C)  SpO2: 100% 99%   8/20 hctz held yesterday d/t poor po intake and for "permissive" elevation to allow cerebral perfusion  8/23 consider increased cozaar if BP remains elevated, continue to hold HCTZ, will try  to avoid diuretic antihypertensives due to bladder dysfunction   8/24 increase cozaar to 100mg  due to elevated BP  8/25 BP appears to be a improved, f/u with PCP for further monitoring  9. T2DM: Hgb A1c- 8.1 (down from 12). Will continue to monitor BS ac/hs             continue Glipizide and metformin  -Well controlled, continue to monitor for now  -8/1 DC glipizide CBG (last 3)  Recent Labs    10/09/21 1654 10/09/21 2057 10/10/21 0614  GLUCAP 134* 154* 97   Decreased metformin to 500mg  qd effective 8/19--observe today. May be able to dc entirely  8/20 will hold metformin for now until po picks up  8/22 restart metformin 500mg  QD  8/25 CBGs improved, continue current   10. AKI  -Will decrease HCTZ to 12.5mg   -NS 50 ml/hr startedd 7/28, Cr reviewed and has normalized  -Will DC fluids 7/31, recheck labs on Wednesday 8/2  -Scr 1.09  8/15, encourage PO intake to prevent repeat AKI, HCTZ decreased back to 12.5mg   -Cr 1.01 8/17  8/20-IVF as above. Recheck labs tomorrow  8/21-DC IVF  8/22 Scr stable 0.99 8/22 11. HLD  -continue statin, heart healthy diet 12. Urinary incontinence  8/5- will order Timed voiding- q2 hours since cannot tell when going/needs to go or has any control- and cannot feel when wet  8/9 continence improving, continue timed voids  8/11 He had 1 dose oxybutynin yesterday, seem likely he  had benefit and had continent episode, will increase oxybutynin to 5mg  TID, monitor PVR  8/15 Urinary retention last night and required IC, will decrease HCTZ and start Flomax  8/19 Urinary retention requiring occ IC, ditropan stopped   -encourage to get out of bed to empty  8/20 CONTINUE as above   -ucx + 100k enterococcus --begin macrobid 100mg  bid  8/21 foley was started yesterday, DC and start voiding trial  8/22 continued urinary retention, increases flomax to 0.8mg   8/23 start urecholine   Continued incontinence, will likely need urology f/u as outpatient, I/O cath training today  8/25 appears he had some continent and incontinent voids, PVRs decreased 89 and 137, repeat I&O cath training 13. Constipation  -improved overall, senokot daily, PRN miralax  -LBM 8/24 14. Anemia, mild  -HGB stable at 11.4 8/21, follow as outpatient  -Stool occult reviewed and is negative 15. Gastroparesis: NGT ordered to decompress stomach. NPO. CT ordered to assess for stroke progression and is stable.   MRI per above 8/19-20 -NGT is out. Pt feels better in this regard  -will not reinsert NGT  -Advance diet to soft- Pt eating most of his meals 8/23 16.Thrush  -Diflucan given, nystatin oral started    LOS:  29 days A FACE TO FACE EVALUATION WAS PERFORMED  9/25 10/10/2021, 9:18 AM

## 2021-10-10 NOTE — Progress Notes (Signed)
Inpatient Rehabilitation Discharge Medication Review by a Pharmacist  A complete drug regimen review was completed for this patient to identify any potential clinically significant medication issues.  High Risk Drug Classes Is patient taking? Indication by Medication  Antipsychotic Yes Thorazine (chlorpromazine) -nausea   Anticoagulant No   Antibiotic Yes Nitrofurantoin-UTI  Opioid No   Antiplatelet Yes ASA/plavix - CVA  (x 3 months then continue ASA alone)  Hypoglycemics/insulin Yes Metformin - DM  Vasoactive Medication Yes Losartan/coreg - HTN  Chemotherapy No   Other Yes Atorvastatin - HLD Nystatin suspension-oral thrush Tamsulosin, bethanechol- urinary retention    --MD instructed to Take aspirin 325 mg daily and clopidogrel daily for 3 months after which, patient will continue aspirin 325 mg alone, Noted that patient  will need to follow up with neurology to confirm end date and further recommendations.     Type of Medication Issue Identified Description of Issue Recommendation(s)  Drug Interaction(s) (clinically significant)     Duplicate Therapy     Allergy     No Medication Administration End Date     Incorrect Dose     Additional Drug Therapy Needed     Significant med changes from prior encounter (inform family/care partners about these prior to discharge).  Stop metoprolol succinate 50 MG q24 hr from now on  Other       Clinically significant medication issues were identified that warrant physician communication and completion of prescribed/recommended actions by midnight of the next day:  No  Pharmacist comments:  Note:-MD instructed patient on discharge orders to Take aspirin 325 mg daily and clopidogrel daily for 3 months after which, patient will continue aspirin 325 mg alone, Noted that patient  will need to follow up with neurology to confirm end date and further recommendations.  Time spent performing this drug regimen review (minutes):  20   Noah Delaine, [place] Clinical Pharmacist 10/10/2021 11:32 AM [phone]

## 2021-10-13 ENCOUNTER — Inpatient Hospital Stay: Payer: Self-pay | Admitting: Internal Medicine

## 2021-10-15 ENCOUNTER — Encounter: Payer: Self-pay | Admitting: Urology

## 2021-10-15 ENCOUNTER — Ambulatory Visit (INDEPENDENT_AMBULATORY_CARE_PROVIDER_SITE_OTHER): Payer: Medicaid Other | Admitting: Urology

## 2021-10-15 VITALS — BP 134/80 | HR 97

## 2021-10-15 DIAGNOSIS — R339 Retention of urine, unspecified: Secondary | ICD-10-CM | POA: Diagnosis not present

## 2021-10-15 DIAGNOSIS — N319 Neuromuscular dysfunction of bladder, unspecified: Secondary | ICD-10-CM | POA: Diagnosis not present

## 2021-10-15 NOTE — Progress Notes (Signed)
Assessment: 1. Urinary retention   2. Neurogenic bladder     Plan: I reviewed the patient's hospital records. Given the temporal relationship between his urinary symptoms and his stroke, I think these are related.  Hopefully as he continues to improve following the stroke, his bladder function will normalize. I discussed continuing intermittent catheterization twice daily to keep cath volumes less than 500 mL. Continue tamsulosin at current dose. Recommend discontinuing bethanechol. Recommend aggressive management of constipation. Return to office in 3 weeks for reevaluation.  Chief Complaint:  Chief Complaint  Patient presents with   Urinary Retention    History of Present Illness:  Shawn Austin is a 56 y.o. male who is seen in consultation from Judee Clara, FNP for evaluation of urinary retention.  He was recently admitted to the hospital after a cerebrovascular accident in July 2023.  He had a prolonged hospital course and was recently discharged from rehab.  Prior to the stroke, he reports no problems with voiding.  No history of urinary retention.  No difficulty starting his stream.  No problems with incontinence.  He required a Foley catheter due to urinary retention while in the hospital.  The catheter was removed prior to discharge.  His wife has been performing intermittent catheterization 1-2 times per day.  Cath volumes are typically 200 mL in the morning and 400 mL in the evening.  No problems with catheterization.  He is having episodes of incontinence during the day.  He is unable to void spontaneously.  He does not have a sensation of bladder fullness.  He is currently on tamsulosin 0.8 mg nightly and bethanechol 3 times daily. He is having problems with constipation.  His last bowel movement was 4 days ago.  He is on a stool softener.  Past Medical History:  Past Medical History:  Diagnosis Date   Anemia    Articular cartilage disorder of hip    Cerebral  ischemia    Congenital deformity of right hip joint    Degeneration of cervical intervertebral disc    Diabetes mellitus without complication (HCC)    type 2   Fatigue    Hyperglycemia due to diabetes mellitus (HCC)    Hyperlipidemia    Hypertension    Hypocalcemia    Hyponatremia    Lower limb pain, inferior, right    Mild cognitive disorder    Pain in joint of left knee    Recurrent falls    Spondylolisthesis of lumbar region    Spondylolisthesis, lumbar region     Past Surgical History:  Past Surgical History:  Procedure Laterality Date   APPENDECTOMY  1970   BUBBLE STUDY  09/11/2021   Procedure: BUBBLE STUDY;  Surgeon: Wendall Stade, MD;  Location: [facility] ENDOSCOPY;  Service: Cardiovascular;;   TEE WITHOUT CARDIOVERSION N/A 09/11/2021   Procedure: TRANSESOPHAGEAL ECHOCARDIOGRAM (TEE);  Surgeon: Wendall Stade, MD;  Location: [facility] ENDOSCOPY;  Service: Cardiovascular;  Laterality: N/A;    Allergies:  No Known Allergies  Family History:  Family History  Problem Relation Age of Onset   Cancer Mother    Glaucoma Father     Social History:  Social History   Tobacco Use   Smoking status: Never  Substance Use Topics   Alcohol use: No   Drug use: No    Review of symptoms:  Constitutional:  Negative for unexplained weight loss, night sweats, fever, chills ENT:  Negative for nose bleeds, sinus pain, painful swallowing CV:  Negative for  chest pain, shortness of breath, exercise intolerance, palpitations, loss of consciousness Resp:  Negative for cough, wheezing, shortness of breath GI:  Negative for nausea, vomiting, diarrhea, bloody stools GU:  Positives noted in HPI; otherwise negative for gross hematuria, dysuria Neuro:  Negative for seizures, poor balance, limb weakness, slurred speech Psych:  Negative for lack of energy, depression, anxiety Endocrine:  Negative for polydipsia, polyuria, symptoms of hypoglycemia (dizziness, hunger, sweating) Hematologic:  Negative  for anemia, purpura, petechia, prolonged or excessive bleeding, use of anticoagulants  Allergic:  Negative for difficulty breathing or choking as a result of exposure to anything; no shellfish allergy; no allergic response (rash/itch) to materials, foods  Physical exam: BP 134/80   Pulse 97  GENERAL APPEARANCE:  Well appearing, well developed, well nourished, NAD HEENT: Atraumatic, Normocephalic, oropharynx clear. NECK: Supple without lymphadenopathy or thyromegaly. LUNGS: Clear to auscultation bilaterally. HEART: Regular Rate and Rhythm without murmurs, gallops, or rubs. ABDOMEN: Soft, non-tender, No Masses. EXTREMITIES: Moves all extremities well.  Without clubbing, cyanosis, or edema. NEUROLOGIC:  Alert and oriented x 3, normal gait, CN II-XII grossly intact.  MENTAL STATUS:  Appropriate. BACK:  Non-tender to palpation.  No CVAT SKIN:  Warm, dry and intact.   GU: Penis:  circumcised Meatus: Normal Scrotum: normal, no masses Testis: normal without masses bilateral Prostate: 40 g, NT, no nodules Rectum: Normal tone,  no masses or tenderness   Results: None

## 2021-10-16 NOTE — Addendum Note (Signed)
Encounter addended by: Andee Lineman A on: 10/16/2021 8:45 AM  Actions taken: Imaging Exam ended

## 2021-10-16 NOTE — Addendum Note (Signed)
Encounter addended by: Andee Lineman A on: 10/16/2021 8:46 AM  Actions taken: Imaging Exam ended

## 2021-10-21 ENCOUNTER — Telehealth: Payer: Self-pay

## 2021-10-21 NOTE — Telephone Encounter (Signed)
180 medical referral sent for catheter supplies

## 2021-10-23 ENCOUNTER — Encounter: Payer: Self-pay | Admitting: Registered Nurse

## 2021-10-23 ENCOUNTER — Encounter: Payer: Medicaid Other | Attending: Registered Nurse | Admitting: Registered Nurse

## 2021-10-23 VITALS — BP 115/76 | HR 97 | Ht 69.0 in

## 2021-10-23 DIAGNOSIS — I639 Cerebral infarction, unspecified: Secondary | ICD-10-CM | POA: Diagnosis not present

## 2021-10-23 DIAGNOSIS — I10 Essential (primary) hypertension: Secondary | ICD-10-CM

## 2021-10-23 NOTE — Progress Notes (Unsigned)
Subjective:    Patient ID: Shawn Austin, male    DOB: [birth year], 56 y.o.   MRN: [id number]  HPI: Shawn Austin is a 56 y.o. male who is here for HFU appointment for F/U of his Acute Stroke of medulla oblongata and Essential hypertension. He was admitted to [facility]  on 09/01/2021 with complaints of disequilibrium .  Dr Dalene Carrow H& P Note:  HPI: Shawn Austin is a 56 y.o. male with medical history significant for type 2 diabetes mellitus, hypertension, hyperlipidemia who is admitted to [facility] on 09/01/2021 with acute ischemic stroke after presenting from home to [facility] ED complaining of disequilibrium.    The patient conveys that he went to sleep at 2100 on 08/29/2021 completely asymptomatic, before awakening on the morning of 08/30/2021 with new onset disequilibrium, which has been associated with new onset slurred speech, with persistence of his symptoms ultimately prompting the patient to present to [facility] emergency department on 09/01/2021 for further evaluation and management thereof.  Denies any associated acute focal weakness nor any acute focal numbness, paresthesias, nausea, vomiting, acute change in vision, or headache.  He also denies any associated chest pain, shortness of breath, palpitations, diaphoresis, presyncope, or syncope.  Dr; Roda Shutters Note:  New stroke - right MCA several punctate infarcts, embolic pattern, concerning for cardioembolic source Some functional decline noted by PT/OT Pt denies any neuro change MRI repeat showed right MCA 3 punctate infarcts, new from last MRI TEE unremarkable Zio patch placed Avoid low BP as patient has right M2 stenosis Increase aspirin from 81 to 325, continue Plavix, DAPT for 3 months and then aspirin alone given right M2 stenosis.   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  Also ? L ACA A1 segment occlusion  CT Perfusion: Negative MRI: Small focus of acute ischemia at the dorsal lateral right medulla oblongata.  Old left BG, right CR infarcts 2D Echo EF 60 to 65% LDL 169 HgbA1c 8.1 VTE prophylaxis -SCDs No AC/AP prior to admission, now on ASA 325mg  and Plavix 75 mg x 3 months then ASA alone given intracranial stenosis Therapy recommendations: CIR Disposition: Pending   Hypertension Stable, elevated  Avoid low BP On HCTZ 25, Coreg 3.125, Cozaar 50 Long-term BP goal normotensive   Hyperlipidemia Home meds:  none LDL 169, not at goal < 70  On atorvastatin 80mg   Continue statin at discharge   Diabetes type II Uncontrolled HgbA1c 8.1, goal < 7.0 CBGs SSI On metformin Close PCP follow up for risk factor control    Shawn Austin was admitted to inpatient rehabilitation on 09/11/2021 and discharged home on 10/10/2021. Not receiving any Home Health Therapy, referral placed to Neuro- Rehabilitation. Was in contact with 09/13/2021 SW, call placed to his girlfriend regarding the above. He denies any pain . He rates his pain 0. Reports his appetite is fair.  He is being cath twice a day , volume 200cc.   [facility] called and obtain schedule appointment, girlfriend verbalizes understanding.   10/12/2021 ( Girlfriend) was instructed to call Cardiology to schedule HFU appointment.   Pain Inventory Average Pain 0 Pain Right Now 0 My pain is  no pain  LOCATION OF PAIN  no pain  BOWEL Number  of stools per week: 3-4 week Oral laxative use No    BLADDER Pads In and out cath, frequency yes Able to self cath No   Difficulty starting stream Yes  Incomplete bladder emptying No    Mobility walk with assistance use a walker ability to climb steps?  no do you drive?  no  Function disabled: date disabled in progress I  need assistance with the following:  toileting, meal prep, household duties, and shopping Do you have any goals in this area?  yes  Neuro/Psych bladder control problems weakness trouble walking  Prior Studies Any changes since last visit?  no   Physicians involved in your care Any changes since last visit?  no   Family History  Problem Relation Age of Onset   Cancer Mother    Glaucoma Father    Social History   Socioeconomic History   Marital status: Single    Spouse name: Not on file   Number of children: Not on file   Years of education: Not on file   Highest education level: Not on file  Occupational History   Not on file  Tobacco Use   Smoking status: Never   Smokeless tobacco: Not on file  Substance and Sexual Activity   Alcohol use: No   Drug use: No   Sexual activity: Not Currently  Other Topics Concern   Not on file  Social History Narrative   Not on file   Social Determinants of Health   Financial Resource Strain: Not on file  Food Insecurity: Not on file  Transportation Needs: Not on file  Physical Activity: Not on file  Stress: Not on file  Social Connections: Not on file   Past Surgical History:  Procedure Laterality Date   APPENDECTOMY  1970   BUBBLE STUDY  09/11/2021   Procedure: BUBBLE STUDY;  Surgeon: Wendall Stade, MD;  Location: [facility] ENDOSCOPY;  Service: Cardiovascular;;   TEE WITHOUT CARDIOVERSION N/A 09/11/2021   Procedure: TRANSESOPHAGEAL ECHOCARDIOGRAM (TEE);  Surgeon: Wendall Stade, MD;  Location: [facility] ENDOSCOPY;  Service: Cardiovascular;  Laterality: N/A;   Past Medical History:  Diagnosis Date   Anemia    Articular cartilage disorder of hip    Cerebral ischemia    Congenital deformity of right hip joint    Degeneration of cervical intervertebral disc    Diabetes mellitus without complication (HCC)    type 2   Fatigue    Hyperglycemia due to diabetes mellitus (HCC)    Hyperlipidemia    Hypertension    Hypocalcemia     Hyponatremia    Lower limb pain, inferior, right    Mild cognitive disorder    Pain in joint of left knee    Recurrent falls    Spondylolisthesis of lumbar region    Spondylolisthesis, lumbar region    There were no vitals taken for this visit.  Opioid Risk Score:   Fall Risk Score:  `1  Depression screen [facility] 2/9     05/26/2018    3:45 PM  Depression screen PHQ 2/9  Decreased Interest 0  Down, Depressed, Hopeless 0  PHQ - 2 Score 0    Review of Systems  Eyes:        Left eye blurry vision  Genitourinary:        Problem starting urine  Musculoskeletal:  Positive for gait problem.       Off balance  Neurological:  Positive for weakness.  All other systems reviewed and are negative.  Objective:   Physical Exam Vitals and nursing note reviewed.  Constitutional:      Appearance: Normal appearance.  Cardiovascular:     Rate and Rhythm: Normal rate and regular rhythm.     Pulses: Normal pulses.     Heart sounds: Normal heart sounds.  Pulmonary:     Effort: Pulmonary effort is normal.     Breath sounds: Normal breath sounds.  Musculoskeletal:     Cervical back: Normal range of motion and neck supple.     Comments: Normal Muscle Bulk and Muscle Testing Reveals:  Upper Extremities: Full ROM and Muscle Strength 5/5 Lower Extremities: Full ROM and Muscle Strength 5/5 Unsteady  Gait : Transfer to wheelchair    Skin:    General: Skin is warm and dry.  Neurological:     Mental Status: He is alert and oriented to person, place, and time.  Psychiatric:        Mood and Affect: Mood normal.        Behavior: Behavior normal.         Assessment & Plan:  Acute Stroke of medulla oblongata: Has a scheduled appointment with Neurology. Referral placed to Neuro- Rehabilitation. Continue current medication regimen.  2. Essential hypertension: Continue current medication regimen. He has a scheduled appointment with PCP.   F/U with Dr. Curlene Dolphin in 4-6 weeks  .

## 2021-11-03 NOTE — Telephone Encounter (Signed)
[facility] called office - unable to reach patient. [facility] Will mail letter patient

## 2021-11-04 ENCOUNTER — Encounter: Payer: Self-pay | Admitting: Nurse Practitioner

## 2021-11-04 ENCOUNTER — Ambulatory Visit: Payer: Medicaid Other | Attending: Internal Medicine | Admitting: Nurse Practitioner

## 2021-11-04 VITALS — BP 134/89 | HR 88 | Temp 98.1°F | Ht 69.0 in | Wt 159.2 lb

## 2021-11-04 DIAGNOSIS — Z114 Encounter for screening for human immunodeficiency virus [HIV]: Secondary | ICD-10-CM

## 2021-11-04 DIAGNOSIS — D649 Anemia, unspecified: Secondary | ICD-10-CM | POA: Diagnosis not present

## 2021-11-04 DIAGNOSIS — Z23 Encounter for immunization: Secondary | ICD-10-CM | POA: Diagnosis not present

## 2021-11-04 DIAGNOSIS — Z7689 Persons encountering health services in other specified circumstances: Secondary | ICD-10-CM

## 2021-11-04 DIAGNOSIS — Z1159 Encounter for screening for other viral diseases: Secondary | ICD-10-CM

## 2021-11-04 DIAGNOSIS — I10 Essential (primary) hypertension: Secondary | ICD-10-CM

## 2021-11-04 DIAGNOSIS — Z1211 Encounter for screening for malignant neoplasm of colon: Secondary | ICD-10-CM

## 2021-11-04 DIAGNOSIS — E1165 Type 2 diabetes mellitus with hyperglycemia: Secondary | ICD-10-CM | POA: Diagnosis not present

## 2021-11-04 DIAGNOSIS — I639 Cerebral infarction, unspecified: Secondary | ICD-10-CM | POA: Diagnosis not present

## 2021-11-04 MED ORDER — ASPIRIN 325 MG PO TBEC: 325.0000 mg | DELAYED_RELEASE_TABLET | Freq: Every day | ORAL | 3 refills | Status: DC

## 2021-11-04 MED ORDER — CARVEDILOL 3.125 MG PO TABS: 3.1250 mg | ORAL_TABLET | Freq: Two times a day (BID) | ORAL | 3 refills | Status: DC

## 2021-11-04 MED ORDER — METFORMIN HCL 500 MG PO TABS: 500.0000 mg | ORAL_TABLET | Freq: Every day | ORAL | 3 refills | Status: DC

## 2021-11-04 MED ORDER — CLOPIDOGREL BISULFATE 75 MG PO TABS: 75.0000 mg | ORAL_TABLET | Freq: Every day | ORAL | 0 refills | Status: DC

## 2021-11-04 MED ORDER — ATORVASTATIN CALCIUM 80 MG PO TABS: 80.0000 mg | ORAL_TABLET | Freq: Every day | ORAL | 6 refills | Status: DC

## 2021-11-04 MED ORDER — LOSARTAN POTASSIUM 100 MG PO TABS: 100.0000 mg | ORAL_TABLET | Freq: Every day | ORAL | 6 refills | Status: DC

## 2021-11-04 NOTE — Patient Instructions (Signed)
Check blood glucose levels twice per day   Once in the AM before he eats and blood glucose should be 90-130  1-2 hours after eating blood glucose should be less than 180.

## 2021-11-04 NOTE — Progress Notes (Signed)
Assessment & Plan:  Shawn Austin was seen today for establish care.  Diagnoses and all orders for this visit:  Encounter to establish care  Primary hypertension -     carvedilol (COREG) 3.125 MG tablet; Take 1 tablet (3.125 mg total) by mouth 2 (two) times daily with a meal. -     losartan (COZAAR) 100 MG tablet; Take 1 tablet (100 mg total) by mouth at bedtime.  Type 2 diabetes mellitus with hyperglycemia, without long-term current use of insulin  Increased to 2 tablets.  -     metFORMIN (GLUCOPHAGE) 500 MG tablet; Take 1 tablet (500 mg total) by mouth daily with breakfast. DOSE CHANGE  Anemia, unspecified type -     CBC with Differential  Need for hepatitis C screening test -     HCV Ab w Reflex to Quant PCR  Encounter for screening for HIV -     HIV Antibody (routine testing w rflx)  Need for tetanus, diphtheria, and acellular pertussis (Tdap) vaccine -     Tdap vaccine greater than or equal to 7yo IM  Need for shingles vaccine -     Varicella-zoster vaccine IM  Colon cancer screening -     Fecal occult blood, imunochemical  Acute ischemic stroke (HCC) -     clopidogrel (PLAVIX) 75 MG tablet; Take 1 tablet (75 mg total) by mouth daily. (Take aspirin 325 mg daily and clopidogrel daily for 3 months after which, patient will continue aspirin 325 mg alone) -     atorvastatin (LIPITOR) 80 MG tablet; Take 1 tablet (80 mg total) by mouth daily. -     aspirin EC 325 MG tablet; Take 1 tablet (325 mg total) by mouth daily.    Patient has been counseled on age-appropriate routine health concerns for screening and prevention. These are reviewed and up-to-date. Referrals have been placed accordingly. Immunizations are up-to-date or declined.    Subjective:   Chief Complaint  Patient presents with   Establish Care   HPI Shawn Austin 56 y.o. male presents to office today to establish care. He is accompanied by his significant other  [name]) today.   He has a past medical  history of Anemia, Articular cartilage disorder of hip, Cerebral ischemia (7-23), Congenital deformity of right hip joint, Degeneration of cervical intervertebral disc, DM2,  Fatigue, Hyperlipidemia, Hypertension, Hypocalcemia, Hyponatremia,  Mild cognitive disorder, Recurrent falls, Spondylolisthesis of lumbar region, and Spondylolisthesis, lumbar region.    Significant other is concerned that since Mr. Boccio stroke he has been laying around in the bed and she has to nag him to get up and wash, brush his teeth and put his clothes on. He declines SSRI today. He is usually awake by 8am and then takes a nap around lunchtime.  He does not speak today. Only nods and shakes his head when asked questions.   He uses a walker to assist with mobility due to high risk for falls and balance disturbance. He has an upcoming appt with physical therapy for evaluation and training.     HTN Blood pressure is slightly elevated today. [name] (significant other) reports lower readings at home. He is taking losartan 100 mg daily and carvedilol 3.125 mg BID as prescribed.  BP Readings from Last 3 Encounters:  11/04/21 134/89  10/23/21 115/76  10/15/21 134/80     DM 2 Not at goal. Poorly controlled. [name] states she has not been checking his blood glucose levels at home. Only helping to administer medications. We  did discuss twice a day finger sticks at this time and what the goals are for his blood sugars. He is taking metformin 500 mg daily. LDL not at goal. He is currently taking high intensity statin which was just started after discharge  Lab Results  Component Value Date   HGBA1C 8.1 (H) 09/02/2021   Lab Results  Component Value Date   LDLCALC 169 (H) 09/02/2021    Neurogenic Bladder Per Urology: Given the temporal relationship between his urinary symptoms and his stroke, I think these are related.  Hopefully as he continues to improve following the stroke, his bladder function will normalize. I  discussed continuing intermittent catheterization twice daily to keep cath volumes less than 500 mL. Continue tamsulosin at current dose. Recommend discontinuing bethanechol. Recommend aggressive management of constipation. Return to office in 3 weeks for reevaluation.   HFU Admitted from 7-17 and tranferred to IP rehab on 09-11-2021 due to acute stroke.  Acute ischemic stroke of dorsolateral right medulla oblongata -7/17 Acute infarct in the right perirolandic frontal parietal region - 7/25 Multifocal multivessel intracranial arterial stenosis After the first stroke, patient was recommended DAPT for 3 weeks only. Per neurology recommendation after the second stroke, patient will continue DAPT with aspirin 325 mg daily and Plavix for 3 months after which, patient will continue aspirin 325 mg alone -7/27, underwent TEE.  Normal LV, mild LVH with EF 70%, negative bubble study, no LAA thrombus      Review of Systems  Constitutional:  Negative for fever, malaise/fatigue and weight loss.  HENT: Negative.  Negative for nosebleeds.   Eyes: Negative.  Negative for blurred vision, double vision and photophobia.  Respiratory: Negative.  Negative for cough and shortness of breath.   Cardiovascular: Negative.  Negative for chest pain, palpitations and leg swelling.  Gastrointestinal:  Positive for constipation. Negative for heartburn, nausea and vomiting.  Genitourinary:        Neurogenic bladder  Musculoskeletal: Negative.  Negative for myalgias.  Neurological:  Positive for weakness. Negative for dizziness, focal weakness, seizures and headaches.       Aphasia  Psychiatric/Behavioral:  Positive for depression. Negative for suicidal ideas.     Past Medical History:  Diagnosis Date   Anemia    Articular cartilage disorder of hip    Cerebral ischemia    Congenital deformity of right hip joint    Degeneration of cervical intervertebral disc    Diabetes mellitus without complication (HCC)     type 2   Fatigue    Hyperglycemia due to diabetes mellitus (HCC)    Hyperlipidemia    Hypertension    Hypocalcemia    Hyponatremia    Lower limb pain, inferior, right    Mild cognitive disorder    Pain in joint of left knee    Recurrent falls    Spondylolisthesis of lumbar region    Spondylolisthesis, lumbar region     Past Surgical History:  Procedure Laterality Date   [place] STUDY  09/11/2021   Procedure: BUBBLE STUDY;  Surgeon: Josue Hector, MD;  Location: [facility];  Service: Cardiovascular;;   TEE WITHOUT CARDIOVERSION N/A 09/11/2021   Procedure: TRANSESOPHAGEAL ECHOCARDIOGRAM (TEE);  Surgeon: Josue Hector, MD;  Location: [facility] ENDOSCOPY;  Service: Cardiovascular;  Laterality: N/A;    Family History  Problem Relation Age of Onset   Cancer Mother    Glaucoma Father     Social History Reviewed with no changes to be made today.  Outpatient Medications Prior to Visit  Medication Sig Dispense Refill   bethanechol (URECHOLINE) 25 MG tablet Take 1 tablet (25 mg total) by mouth 3 (three) times daily. 90 tablet 0   chlorproMAZINE (THORAZINE) 25 MG tablet Take 0.5 tablets (12.5 mg total) by mouth 2 (two) times daily. 30 tablet 0   senna-docusate (SENOKOT-S) 8.6-50 MG tablet Take 2 tablets by mouth daily. 60 tablet 0   tamsulosin (FLOMAX) 0.4 MG CAPS capsule Take 2 capsules (0.8 mg total) by mouth daily after supper. 60 capsule 0   aspirin EC 325 MG tablet Take 1 tablet (325 mg total) by mouth daily. 300 tablet 0   atorvastatin (LIPITOR) 80 MG tablet Take 1 tablet (80 mg total) by mouth daily. 30 tablet 0   carvedilol (COREG) 3.125 MG tablet Take 1 tablet (3.125 mg total) by mouth 2 (two) times daily with a meal. 60 tablet 0   clopidogrel (PLAVIX) 75 MG tablet Take 1 tablet (75 mg total) by mouth daily. (Take aspirin 325 mg daily and clopidogrel daily for 3 months after which, patient will continue aspirin 325 mg alone) 30 tablet 0   losartan (COZAAR) 100  MG tablet Take 1 tablet (100 mg total) by mouth at bedtime. 30 tablet 0   metFORMIN (GLUCOPHAGE) 500 MG tablet Take 1 tablet (500 mg total) by mouth daily with breakfast. 30 tablet 0   nitrofurantoin, macrocrystal-monohydrate, (MACROBID) 100 MG capsule Take 1 capsule (100 mg total) by mouth every 12 (twelve) hours. 5 capsule 0   nystatin (MYCOSTATIN) 100000 UNIT/ML suspension Take 5 mLs (500,000 Units total) by mouth 4 (four) times daily. (Patient not taking: Reported on 11/04/2021) 60 mL 0   No facility-administered medications prior to visit.    No Known Allergies     Objective:    BP 134/89   Pulse 88   Temp 98.1 F (36.7 C) (Oral)   Ht 5\' 9"  (1.753 m)   Wt 159 lb 3.2 oz (72.2 kg)   SpO2 98%   BMI 23.51 kg/m  Wt Readings from Last 3 Encounters:  11/04/21 159 lb 3.2 oz (72.2 kg)  09/25/21 182 lb 12.2 oz (82.9 kg)  09/11/21 185 lb (83.9 kg)    Physical Exam Vitals and nursing note reviewed.  Constitutional:      Appearance: He is well-developed.  HENT:     Head: Normocephalic and atraumatic.  Cardiovascular:     Rate and Rhythm: Normal rate and regular rhythm.     Heart sounds: Normal heart sounds. No murmur heard.    No friction rub. No gallop.  Pulmonary:     Effort: Pulmonary effort is normal. No tachypnea or respiratory distress.     Breath sounds: Normal breath sounds. No decreased breath sounds, wheezing, rhonchi or rales.  Chest:     Chest wall: No tenderness.  Abdominal:     General: Bowel sounds are normal.     Palpations: Abdomen is soft.  Musculoskeletal:        General: Normal range of motion.     Cervical back: Normal range of motion.  Skin:    General: Skin is warm and dry.  Neurological:     Mental Status: He is alert and oriented to person, place, and time.     Motor: Weakness present.     Coordination: Coordination normal.     Gait: Gait abnormal.  Psychiatric:        Behavior: Behavior normal. Behavior is cooperative.  Thought Content:  Thought content normal.        Judgment: Judgment normal.          Patient has been counseled extensively about nutrition and exercise as well as the importance of adherence with medications and regular follow-up. The patient was given clear instructions to go to ER or return to medical center if symptoms don't improve, worsen or new problems develop. The patient verbalized understanding.   Follow-up: Return in about 3 months (around 02/03/2022) for PHYSICAL.   Gildardo Pounds, FNP-BC [facility] [place], [place]   11/04/2021, 8:11 PM

## 2021-11-05 ENCOUNTER — Encounter: Payer: Self-pay | Admitting: Neurology

## 2021-11-05 ENCOUNTER — Ambulatory Visit: Payer: Self-pay | Admitting: Neurology

## 2021-11-05 VITALS — BP 127/81 | HR 89 | Ht 69.0 in | Wt 155.0 lb

## 2021-11-05 DIAGNOSIS — I10 Essential (primary) hypertension: Secondary | ICD-10-CM

## 2021-11-05 DIAGNOSIS — E1165 Type 2 diabetes mellitus with hyperglycemia: Secondary | ICD-10-CM

## 2021-11-05 DIAGNOSIS — I639 Cerebral infarction, unspecified: Secondary | ICD-10-CM

## 2021-11-05 LAB — CBC WITH DIFFERENTIAL/PLATELET
Basophils Absolute: 0 10*3/uL (ref 0.0–0.2)
Basos: 1 %
EOS (ABSOLUTE): 0.1 10*3/uL (ref 0.0–0.4)
Eos: 2 %
Hematocrit: 35 % — ABNORMAL LOW (ref 37.5–51.0)
Hemoglobin: 11.7 g/dL — ABNORMAL LOW (ref 13.0–17.7)
Immature Grans (Abs): 0 10*3/uL (ref 0.0–0.1)
Immature Granulocytes: 0 %
Lymphocytes Absolute: 1.6 10*3/uL (ref 0.7–3.1)
Lymphs: 45 %
MCH: 27.6 pg (ref 26.6–33.0)
MCHC: 33.4 g/dL (ref 31.5–35.7)
MCV: 83 fL (ref 79–97)
Monocytes Absolute: 0.3 10*3/uL (ref 0.1–0.9)
Monocytes: 9 %
Neutrophils Absolute: 1.5 10*3/uL (ref 1.4–7.0)
Neutrophils: 43 %
Platelets: 500 10*3/uL — ABNORMAL HIGH (ref 150–450)
RBC: 4.24 x10E6/uL (ref 4.14–5.80)
RDW: 13.2 % (ref 11.6–15.4)
WBC: 3.5 10*3/uL (ref 3.4–10.8)

## 2021-11-05 LAB — HCV AB W REFLEX TO QUANT PCR: HCV Ab: NONREACTIVE

## 2021-11-05 LAB — HCV INTERPRETATION

## 2021-11-05 LAB — HIV ANTIBODY (ROUTINE TESTING W REFLEX): HIV Screen 4th Generation wRfx: NONREACTIVE

## 2021-11-05 NOTE — Progress Notes (Signed)
Patient: Shawn Austin Date of Birth: 1965-11-28  Reason for Visit: Follow up for stroke History from: Patient, significant other  Primary Neurologist: Saw Dr. Erlinda Hong & Dr. Leonie Man   ASSESSMENT AND PLAN 56 y.o. year old male   1.  Stroke, small right lateral medullary infarct likely due to small vessel disease 09/01/21 2.  New stroke 09/10/21 right MCA several punctate infarcts, embolic pattern concerning for cardioembolic source 3. [id number] patient with persistent nausea/vomiting, MRI concerning for new right frontal lobe infarct or evolution of right MCA stroke -TEE was unremarkable -Cardiac monitor predominant SR, difficulty discerning atrial activity making definitive diagnosis difficult to a certain -Aspirin [id number] mg daily, Plavix 75 mg daily DAPT for 3 months then aspirin 325 mg daily given right M2 stenosis  -Referral to cardiology EP given cryptogenic stroke, concerning for recurrent embolic strokes -Starting neuro rehab this week   4.  Hypertension -Goal BP less than 130/90, avoid low BP given right M2 occlusion -On carvedilol, losartan  5.  Hyperlipidemia -LDL 169, goal less than 70 -On Lipitor 80 mg  -Was noncompliant prior to surgery  6.  Type 2 diabetes uncontrolled -A1c 8.1, goal less than 7.0 -On metformin  For any acute stroke symptoms, he should go to the ER.  He will follow-up in 3 to 4 months at our office to see Dr. Leonie Man.  HISTORY OF PRESENT ILLNESS: Today 11/05/21 Shawn Austin is here today for stroke clinic follow-up.  Presented 09/01/21 with leaning to the left, right gaze preference, feeling off balance with recent falls.  MRI showed acute ischemia of the right medulla oblongata. On 7/25 developed worsening balance, visual change.  MRI repeated showed new acute infarcts in the rightperirolandic frontoparietal region with mild increase in size of right medullary infarct. Was admitted to inpatient rehab 09/11/21-10/10/21. 8/17 developed progressive nausea MRI  repeated showed acute and likely subacute infarct in the medial right frontal lobe.  His nausea improved, but limited by visual deficits with apraxia and ataxia.  Has had to do in and out catheterizations since his stroke.  Has seen urology, wears incontinence underwear, he has accidents.   Here today with significant other, Katina.  He is not a morning person, it is 10:15 am. He is irritated he has so many appointments this week. Denies trouble chewing or swallowing. Right leg was weak before the stroke. Feels getting weaker since, hasn't been moving. He doesn't want to walk. Referred to neuro rehab yesterday, has appointment Friday. Is on Lipitor 80 but wasn't taking prior to stroke. Worked for the [employer], Chief Financial Officer. Has been out of work for almost a year due to high BP, he was not compliant with his medications.   -CT head no acute abnormality -CTA head and neck multifocal multivessel stenosis at distal left M1, proximal right M2, right side, -09/01/21 MRI small focus of acute ischemia at the dorsal lateral right medulla oblongata  Old left basal ganglia, right corona radiata infarcts -09/09/21 MRI of the brain few small acute infarcts in the right perirolandic frontoparietal region, increase in size of the restricted diffusion in the dorsal lateral right medulla could represent evolution of prior infarct versus slight interval expansion of the infarct -10/03/21 MRI of the brain acute and likely subacute infarct in the medial right frontal lobe white matter.  Possible evolution of prior infarct versus additional subacute infarct in the dorsal lateral right medulla -2D echo EF 60 to 65% -LDL 169 -A1c 8.1 -No antithrombotic prior to admission, now aspirin  325 mg and Plavix 75 mg daily for 3 months, then aspirin alone given intracranial stenosis -14 day Zio patch: Patient had a min HR of 63 bpm, max HR of 125 bpm, and avg HR of 88 bpm. Predominant underlying rhythm was Sinus Rhythm. First Degree AV Block  was present. Isolated SVEs were rare (<1.0%), and no SVE Couplets or SVE Triplets were present. Isolated VEs  were rare (<1.0%), and no VE Couplets or VE Triplets were present. Difficulty discerning atrial activity making definitive diagnosis difficult to ascertain.  HISTORY Copied Dr. Lorrin Goodell 09/01/21 H & P: Shawn Austin is a 56 y.o. male with PMH significant for DM2, HTN, HLD, degenerative spinal disc disease, who presents with leaning to the left and eyes deviated to the right and feeling off balance with falls over the last few days.   Patient reports that he went to bed on Saturday around 2100 and woke up Sunday AM and felt like he was walking to his left when going to the bathroom.  He reports that in the afternoon, he fell while attempting to walk.  He reports that he has had a couple falls since then including in the morning today.  He reports that he felt that his speech was slightly slurred on Sunday.  He reports that today in the afternoon, his sensation of disequilibrium has worsened, his speech is more slurry and he feels very incoordinated.   LKW: 2100 on 08/30/2021. mRS: 0 tNKASE: Not offered due to patient outside window. Thrombectomy: Not offered due to low suspicion for an LVO.   REVIEW OF SYSTEMS: Out of a complete 14 system review of symptoms, the patient complains only of the following symptoms, and all other reviewed systems are negative.  See HPI  ALLERGIES: No Known Allergies  HOME MEDICATIONS: Outpatient Medications Prior to Visit  Medication Sig Dispense Refill   aspirin EC 325 MG tablet Take 1 tablet (325 mg total) by mouth daily. 90 tablet 3   atorvastatin (LIPITOR) 80 MG tablet Take 1 tablet (80 mg total) by mouth daily. 30 tablet 6   bethanechol (URECHOLINE) 25 MG tablet Take 1 tablet (25 mg total) by mouth 3 (three) times daily. 90 tablet 0   carvedilol (COREG) 3.125 MG tablet Take 1 tablet (3.125 mg total) by mouth 2 (two) times daily with a meal. 60  tablet 3   chlorproMAZINE (THORAZINE) 25 MG tablet Take 0.5 tablets (12.5 mg total) by mouth 2 (two) times daily. 30 tablet 0   clopidogrel (PLAVIX) 75 MG tablet Take 1 tablet (75 mg total) by mouth daily. (Take aspirin 325 mg daily and clopidogrel daily for 3 months after which, patient will continue aspirin 325 mg alone) 30 tablet 0   losartan (COZAAR) 100 MG tablet Take 1 tablet (100 mg total) by mouth at bedtime. 30 tablet 6   metFORMIN (GLUCOPHAGE) 500 MG tablet Take 1 tablet (500 mg total) by mouth daily with breakfast. DOSE CHANGE 60 tablet 3   senna-docusate (SENOKOT-S) 8.6-50 MG tablet Take 2 tablets by mouth daily. 60 tablet 0   tamsulosin (FLOMAX) 0.4 MG CAPS capsule Take 2 capsules (0.8 mg total) by mouth daily after supper. 60 capsule 0   No facility-administered medications prior to visit.    PAST MEDICAL HISTORY: Past Medical History:  Diagnosis Date   Anemia    Articular cartilage disorder of hip    Cerebral ischemia    Congenital deformity of right hip joint    Degeneration of cervical intervertebral disc  Diabetes mellitus without complication ([place])    type 2   Fatigue    Hyperglycemia due to diabetes mellitus (HCC)    Hyperlipidemia    Hypertension    Hypocalcemia    Hyponatremia    Lower limb pain, inferior, right    Mild cognitive disorder    Pain in joint of left knee    Recurrent falls    Spondylolisthesis of lumbar region    Spondylolisthesis, lumbar region     PAST SURGICAL HISTORY: Past Surgical History:  Procedure Laterality Date   [place] STUDY  09/11/2021   Procedure: BUBBLE STUDY;  Surgeon: Josue Hector, MD;  Location: [place];  Service: Cardiovascular;;   TEE WITHOUT CARDIOVERSION N/A 09/11/2021   Procedure: TRANSESOPHAGEAL ECHOCARDIOGRAM (TEE);  Surgeon: Josue Hector, MD;  Location[facility] ENDOSCOPY;  Service: Cardiovascular;  Laterality: N/A;    FAMILY HISTORY: Family History  Problem Relation Age of Onset    Cancer Mother    Glaucoma Father     SOCIAL HISTORY: Social History   Socioeconomic History   Marital status: Single    Spouse name: Not on file   Number of children: Not on file   Years of education: Not on file   Highest education level: Not on file  Occupational History   Not on file  Tobacco Use   Smoking status: Never   Smokeless tobacco: Not on file  Vaping Use   Vaping Use: Not on file  Substance and Sexual Activity   Alcohol use: No   Drug use: No   Sexual activity: Not Currently  Other Topics Concern   Not on file  Social History Narrative   Not on file   Social Determinants of Health   Financial Resource Strain: Not on file  Food Insecurity: Not on file  Transportation Needs: Not on file  Physical Activity: Not on file  Stress: Not on file  Social Connections: Not on file  Intimate Partner Violence: Not on file    PHYSICAL EXAM  Vitals:   11/05/21 1007  BP: 127/81  Pulse: 89  Weight: 155 lb (70.3 kg)  Height: 5\' 9"  (1.753 m)   Body mass index is 22.89 kg/m.  Generalized: Well developed, in no acute distress, sitting in wheelchair, appears irritable, disinterested Neurological examination  Mentation: Alert oriented to time, place, history mostly provided by his significant other.  Follows all commands, slight dysarthria with speech, few times word finding trouble with frustration. Cranial nerve II-XII: Pupils were equal round reactive to light. Extraocular movements were full, visual field were full on confrontational test on exam but have noted left eye incomplete abduction. Facial sensation and strength were normal. Decreased left shoulder shrug.  Left-sided facial droop.  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 Motor: Good strength to upper and lower extremities.  When talking, tends to have head rolling. Sensory: Sensory testing is intact to soft touch on all 4 extremities. No evidence of  extinction is noted.  Coordination: Slight dysmetria with finger-nose-finger bilaterally, and with heel-to-shin with the left Gait and station: Has to push off from seated position to stand, gait is unsteady with 2 person assist, tends to lean towards the right, the left leg is straight, has circumduction type swing Reflexes: Deep tendon reflexes are symmetric and normal bilaterally.   DIAGNOSTIC DATA (LABS, IMAGING, TESTING) - I reviewed patient records, labs, notes, testing and imaging myself where available.  Lab  Results  Component Value Date   WBC 3.5 11/04/2021   HGB 11.7 (L) 11/04/2021   HCT 35.0 (L) 11/04/2021   MCV 83 11/04/2021   PLT 500 (H) 11/04/2021      Component Value Date/Time   NA 137 10/07/2021 0554   K 4.1 10/07/2021 0554   CL 103 10/07/2021 0554   CO2 23 10/07/2021 0554   GLUCOSE 131 (H) 10/07/2021 0554   BUN 7 10/07/2021 0554   CREATININE 0.99 10/07/2021 0554   CREATININE 0.78 05/19/2012 2033   CALCIUM 9.1 10/07/2021 0554   PROT 6.7 09/11/2021 1846   ALBUMIN 3.2 (L) 09/11/2021 1846   AST 22 09/11/2021 1846   ALT 20 09/11/2021 1846   ALKPHOS 88 09/11/2021 1846   BILITOT 0.7 09/11/2021 1846   GFRNONAA >60 10/07/2021 0554   Lab Results  Component Value Date   CHOL 232 (H) 09/02/2021   HDL 50 09/02/2021   LDLCALC 169 (H) 09/02/2021   TRIG 65 09/02/2021   CHOLHDL 4.6 09/02/2021   Lab Results  Component Value Date   HGBA1C 8.1 (H) 09/02/2021   No results found for: "VITAMINB12" Lab Results  Component Value Date   TSH 0.984 04/14/2012   Butler Denmark, AGNP-C, DNP 11/05/2021, 10:44 AM [facility] [address], [place], [place] [address] [phone]

## 2021-11-05 NOTE — Patient Instructions (Addendum)
Referral to EP for consideration of loop recorder  Continue aspirin 325 mg and Plavix 75 mg daily for 3 months (started in July, continue through mid October), then switch to aspirin 325 mg daily  Work on A1C, goal is < 7.0, LDL < 70, BP < 130/90 Get started in neuro rehab  See you back 3-4 months to see Dr. Leonie Man

## 2021-11-06 ENCOUNTER — Ambulatory Visit: Payer: Self-pay | Admitting: Urology

## 2021-11-06 NOTE — Progress Notes (Deleted)
Assessment: 1. Urinary retention   2. Neurogenic bladder      Plan: I reviewed the patient's hospital records. Given the temporal relationship between his urinary symptoms and his stroke, I think these are related.  Hopefully as he continues to improve following the stroke, his bladder function will normalize. I discussed continuing intermittent catheterization twice daily to keep cath volumes less than 500 mL. Continue tamsulosin at current dose. Recommend discontinuing bethanechol. Recommend aggressive management of constipation. Return to office in 3 weeks for reevaluation.  Chief Complaint:  No chief complaint on file.   History of Present Illness:  Shawn Austin is a 56 y.o. male who is seen for further evaluation of urinary retention.  He was recently admitted to the hospital after a cerebrovascular accident in July 2023.  He had a prolonged hospital course and was recently discharged from rehab.  Prior to the stroke, he reports no problems with voiding.  No history of urinary retention.  No difficulty starting his stream.  No problems with incontinence.  He required a Foley catheter due to urinary retention while in the hospital.  The catheter was removed prior to discharge.  His wife has been performing intermittent catheterization 1-2 times per day.  Cath volumes are typically 200 mL in the morning and 400 mL in the evening.  No problems with catheterization.  He is having episodes of incontinence during the day.  He is unable to void spontaneously.  He does not have a sensation of bladder fullness.  He is currently on tamsulosin 0.8 mg nightly and bethanechol 3 times daily. He is having problems with constipation.  His last bowel movement was 4 days ago.  He is on a stool softener.  Portions of the above documentation were copied from a prior visit for review purposes only.   Past Medical History:  Past Medical History:  Diagnosis Date   Anemia    Articular cartilage  disorder of hip    Cerebral ischemia    Congenital deformity of right hip joint    Degeneration of cervical intervertebral disc    Diabetes mellitus without complication (HCC)    type 2   Fatigue    Hyperglycemia due to diabetes mellitus (HCC)    Hyperlipidemia    Hypertension    Hypocalcemia    Hyponatremia    Lower limb pain, inferior, right    Mild cognitive disorder    Pain in joint of left knee    Recurrent falls    Spondylolisthesis of lumbar region    Spondylolisthesis, lumbar region     Past Surgical History:  Past Surgical History:  Procedure Laterality Date   [place] STUDY  09/11/2021   Procedure: BUBBLE STUDY;  Surgeon: Josue Hector, MD;  Location: [place];  Service: Cardiovascular;;   TEE WITHOUT CARDIOVERSION N/A 09/11/2021   Procedure: TRANSESOPHAGEAL ECHOCARDIOGRAM (TEE);  Surgeon: Josue Hector, MD;  Location: [facility] ENDOSCOPY;  Service: Cardiovascular;  Laterality: N/A;    Allergies:  No Known Allergies  Family History:  Family History  Problem Relation Age of Onset   Cancer Mother    Glaucoma Father     Social History:  Social History   Tobacco Use   Smoking status: Never  Substance Use Topics   Alcohol use: No   Drug use: No    ROS: Constitutional:  Negative for fever, chills, weight loss CV: Negative for chest pain, previous MI, hypertension Respiratory:  Negative for shortness of  breath, wheezing, sleep apnea, frequent cough GI:  Negative for nausea, vomiting, bloody stool, GERD  Physical exam: There were no vitals taken for this visit. GENERAL APPEARANCE:  Well appearing, well developed, well nourished, NAD HEENT:  Atraumatic, normocephalic, oropharynx clear NECK:  Supple without lymphadenopathy or thyromegaly ABDOMEN:  Soft, non-tender, no masses EXTREMITIES:  Moves all extremities well, without clubbing, cyanosis, or edema NEUROLOGIC:  Alert and oriented x 3, normal gait, CN II-XII grossly intact MENTAL  STATUS:  appropriate BACK:  Non-tender to palpation, No CVAT SKIN:  Warm, dry, and intact   Results: None

## 2021-11-07 ENCOUNTER — Ambulatory Visit: Payer: Self-pay | Attending: Registered Nurse | Admitting: Physical Therapy

## 2021-11-08 NOTE — Progress Notes (Signed)
I agree with the above plan 

## 2021-11-10 NOTE — Progress Notes (Signed)
Kindly inform the patient that 30-day heart monitor study did not reveal any evidence of atrial fibrillation or any worrisome arrhythmia.  Nothing to worry about

## 2021-11-11 ENCOUNTER — Telehealth: Payer: Self-pay

## 2021-11-11 NOTE — Telephone Encounter (Signed)
I spoke with the patient's friend, [name] (as per [facility]). I informed her of normal results. She verbalized understanding of the findings and expressed appreciation for the call. All questions answered.

## 2021-11-11 NOTE — Telephone Encounter (Signed)
-----   Message from Garvin Fila, MD sent at 11/10/2021  7:36 PM EDT ----- Mitchell Heir inform the patient that 30-day heart monitor study did not reveal any evidence of atrial fibrillation or any worrisome arrhythmia.  Nothing to worry about

## 2021-11-17 ENCOUNTER — Encounter: Payer: Medicaid Other | Attending: Physical Medicine & Rehabilitation | Admitting: Physical Medicine & Rehabilitation

## 2021-11-17 DIAGNOSIS — I10 Essential (primary) hypertension: Secondary | ICD-10-CM | POA: Insufficient documentation

## 2021-11-17 DIAGNOSIS — I639 Cerebral infarction, unspecified: Secondary | ICD-10-CM | POA: Insufficient documentation

## 2021-11-18 ENCOUNTER — Other Ambulatory Visit: Payer: Self-pay | Admitting: Nurse Practitioner

## 2021-11-18 DIAGNOSIS — I639 Cerebral infarction, unspecified: Secondary | ICD-10-CM

## 2021-11-18 NOTE — Telephone Encounter (Signed)
  Notes to clinic:  pharm requests as follows: Pharmacy comment: REQUEST FOR 90 DAYS PRESCRIPTION.      Requested Prescriptions  Pending Prescriptions Disp Refills   clopidogrel (PLAVIX) 75 MG tablet [Pharmacy Med Name: CLOPIDOGREL 75 MG TABLET] 90 tablet 1    Sig: Take 1 tablet (75 mg total) by mouth daily. (Take aspirin 325 mg daily and clopidogrel daily for 3 months after which, patient will continue aspirin 325 mg alone)     Hematology: Antiplatelets - clopidogrel Failed - 11/18/2021 11:17 AM      Failed - HCT in normal range and within 180 days    Hematocrit  Date Value Ref Range Status  11/04/2021 35.0 (L) 37.5 - 51.0 % Final         Failed - HGB in normal range and within 180 days    Hemoglobin  Date Value Ref Range Status  11/04/2021 11.7 (L) 13.0 - 17.7 g/dL Final         Failed - PLT in normal range and within 180 days    Platelets  Date Value Ref Range Status  11/04/2021 500 (H) 150 - 450 x10E3/uL Final   Platelet Count, POC  Date Value Ref Range Status  05/19/2012 415 142 - 424 K/uL Final         Passed - Cr in normal range and within 360 days    Creat  Date Value Ref Range Status  05/19/2012 0.78 0.50 - 1.35 mg/dL Final   Creatinine, Ser  Date Value Ref Range Status  10/07/2021 0.99 0.61 - 1.24 mg/dL Final         Passed - Valid encounter within last 6 months    Recent Outpatient Visits           2 weeks ago Encounter to establish care   [facility], Vernia Buff, NP   3 years ago Uncontrolled hypertension   [facility] [place], [place], MD   9 years ago Type II or unspecified type diabetes mellitus without mention of complication, not stated as uncontrolled   [facility] at [facility], Renette Butters, MD   9 years ago Type II or unspecified type diabetes mellitus without mention of complication, uncontrolled   [facility] at [facility], Renette Butters, MD   9 years ago Type II or unspecified type diabetes  mellitus without mention of complication, not stated as uncontrolled   [facility] at [facility], Renette Butters, MD       Future Appointments             In 2 months Gildardo Pounds, NP [place]

## 2021-12-23 ENCOUNTER — Institutional Professional Consult (permissible substitution): Payer: Self-pay | Admitting: Internal Medicine

## 2022-02-03 ENCOUNTER — Ambulatory Visit: Payer: Self-pay | Admitting: Nurse Practitioner

## 2022-02-04 ENCOUNTER — Ambulatory Visit: Payer: Self-pay | Admitting: Physician Assistant

## 2022-02-10 ENCOUNTER — Ambulatory Visit: Payer: Self-pay | Admitting: Nurse Practitioner

## 2022-03-10 ENCOUNTER — Encounter: Payer: Self-pay | Admitting: Neurology

## 2022-03-10 ENCOUNTER — Ambulatory Visit: Payer: Medicaid Other | Admitting: Neurology

## 2022-03-10 VITALS — BP 163/76 | HR 80

## 2022-03-10 DIAGNOSIS — I639 Cerebral infarction, unspecified: Secondary | ICD-10-CM

## 2022-03-10 DIAGNOSIS — R269 Unspecified abnormalities of gait and mobility: Secondary | ICD-10-CM

## 2022-03-10 NOTE — Patient Instructions (Addendum)
I had a long d/w patient and his wife about his remote stroke,residual gait ataxia, risk for recurrent stroke/TIAs, personally independently reviewed imaging studies and stroke evaluation results and answered questions.Continue aspirin 81 mg daily  but stop Plavix now for secondary stroke prevention and maintain strict control of hypertension with blood pressure goal below 130/90, diabetes with hemoglobin A1c goal below 6.5% and lipids with LDL cholesterol goal below 70 mg/dL. I also advised the patient to eat a healthy diet with plenty of whole grains, cereals, fruits and vegetables, exercise regularly and maintain ideal body weight.  I advised him to use a walker at all times and we discussed fall prevention precautions.  Followup in the future with stated nurse practitioner in 6 months or call if necessary.  Fall Prevention in the Home, Adult Falls can cause injuries and affect people of all ages. There are many simple things that you can do to make your home safe and to help prevent falls. Ask for help when making these changes, if needed. What actions can I take to prevent falls? General instructions Use good lighting in all rooms. Replace any light bulbs that burn out, turn on lights if it is dark, and use night-lights. Place frequently used items in easy-to-reach places. Lower the shelves around your home if necessary. Set up furniture so that there are clear paths around it. Avoid moving your furniture around. Remove throw rugs and other tripping hazards from the floor. Avoid walking on wet floors. Fix any uneven floor surfaces. Add color or contrast paint or tape to grab bars and handrails in your home. Place contrasting color strips on the first and last steps of staircases. When you use a stepladder, make sure that it is completely opened and that the sides and supports are firmly locked. Have someone hold the ladder while you are using it. Do not climb a closed stepladder. Know where your  pets are when moving through your home. What can I do in the bathroom?     Keep the floor dry. Immediately clean up any water that is on the floor. Remove soap buildup in the tub or shower regularly. Use nonskid mats or decals on the floor of the tub or shower. Attach bath mats securely with double-sided, nonslip rug tape. If you need to sit down while you are in the shower, use a plastic, nonslip stool. Install grab bars by the toilet and in the tub and shower. Do not use towel bars as grab bars. What can I do in the bedroom? Make sure that a bedside light is easy to reach. Do not use oversized bedding that reaches the floor. Have a firm chair that has side arms to use for getting dressed. What can I do in the kitchen? Clean up any spills right away. If you need to reach for something above you, use a sturdy step stool that has a grab bar. Keep electrical cables out of the way. Do not use floor polish or wax that makes floors slippery. If you must use wax, make sure that it is non-skid floor wax. What can I do with my stairs? Do not leave any items on the stairs. Make sure that you have a light switch at the top and the bottom of the stairs. Have them installed if you do not have them. Make sure that there are handrails on both sides of the stairs. Fix handrails that are broken or loose. Make sure that handrails are as long as the staircases.  Install non-slip stair treads on all stairs in your home. Avoid having throw rugs at the top or bottom of stairs, or secure the rugs with carpet tape to prevent them from moving. Choose a carpet design that does not hide the edge of steps on the stairs. Check any carpeting to make sure that it is firmly attached to the stairs. Fix any carpet that is loose or worn. What can I do on the outside of my home? Use bright outdoor lighting. Regularly repair the edges of walkways and driveways and fix any cracks. Remove high doorway thresholds. Trim any  shrubbery on the main path into your home. Regularly check that handrails are securely fastened and in good repair. Both sides of all steps should have handrails. Install guardrails along the edges of any raised decks or porches. Clear walkways of debris and clutter, including tools and rocks. Have leaves, snow, and ice cleared regularly. Use sand or salt on walkways during winter months. In the garage, clean up any spills right away, including grease or oil spills. What other actions can I take? Wear closed-toe shoes that fit well and support your feet. Wear shoes that have rubber soles or low heels. Use mobility aids as needed, such as canes, walkers, scooters, and crutches. Review your medicines with your health care provider. Some medicines can cause dizziness or changes in blood pressure, which increase your risk of falling. Talk with your health care provider about other ways that you can decrease your risk of falls. This may include working with a physical therapist or trainer to improve your strength, balance, and endurance. Where to find more information Centers for Disease Control and Prevention, [employer]: http://www.wolf.info/ National Institute on Aging: http://kim-miller.com/ Contact a health care provider if: You are afraid of falling at home. You feel weak, drowsy, or dizzy at home. You fall at home. Summary There are many simple things that you can do to make your home safe and to help prevent falls. Ways to make your home safe include removing tripping hazards and installing grab bars in the bathroom. Ask for help when making these changes in your home. This information is not intended to replace advice given to you by your health care provider. Make sure you discuss any questions you have with your health care provider. Document Revised: 11/04/2020 Document Reviewed: 09/06/2019 Elsevier Patient Education  Onset.

## 2022-03-10 NOTE — Progress Notes (Signed)
Patient: Shawn Austin Date of Birth: 03-21-1965  Reason for Visit: Follow up for stroke History from: Patient, significant other  Primary Neurologist: Saw Shawn Austin & Shawn Austin     HISTORY OF PRESENT ILLNESS: Prior visit 10/18/2021 Shawn Austin, Shawn Austin) Shawn Austin is here today for stroke clinic follow-up.  Presented 09/01/21 with leaning to the left, right gaze preference, feeling off balance with recent falls.  MRI showed acute ischemia of the right medulla oblongata. On 7/25 developed worsening balance, visual change.  MRI repeated showed new acute infarcts in the rightperirolandic frontoparietal region with mild increase in size of right medullary infarct. Was admitted to inpatient rehab 09/11/21-10/10/21. 8/17 developed progressive nausea MRI repeated showed acute and likely subacute infarct in the medial right frontal lobe.  His nausea improved, but limited by visual deficits with apraxia and ataxia.  Has had to do in and out catheterizations since his stroke.  Has seen urology, wears incontinence underwear, he has accidents.   Here today with significant other, Shawn Austin.  He is not a morning person, it is 10:15 am. He is irritated he has so many appointments this week. Denies trouble chewing or swallowing. Right leg was weak before the stroke. Feels getting weaker since, hasn't been moving. He doesn't want to walk. Referred to neuro rehab yesterday, has appointment Friday. Is on Lipitor 80 but wasn't taking prior to stroke. Worked for the [employer], Art gallery manager. Has been out of work for almost a year due to high BP, he was not compliant with his medications.   -CT head no acute abnormality -CTA head and neck multifocal multivessel stenosis at distal left M1, proximal right M2, right side, -09/01/21 MRI small focus of acute ischemia at the dorsal lateral right medulla oblongata  Old left basal ganglia, right corona radiata infarcts -09/09/21 MRI of the brain few small acute infarcts in the right  perirolandic frontoparietal region, increase in size of the restricted diffusion in the dorsal lateral right medulla could represent evolution of prior infarct versus slight interval expansion of the infarct -10/03/21 MRI of the brain acute and likely subacute infarct in the medial right frontal lobe white matter.  Possible evolution of prior infarct versus additional subacute infarct in the dorsal lateral right medulla -2D echo EF 60 to 65% -LDL 169 -A1c 8.1 -No antithrombotic prior to admission, now aspirin 325 mg and Plavix 75 mg daily for 3 months, then aspirin alone given intracranial stenosis -14 day Zio patch: Patient had a min HR of 63 bpm, max HR of 125 bpm, and avg HR of 88 bpm. Predominant underlying rhythm was Sinus Rhythm. First Degree AV Block was present. Isolated SVEs were rare (<1.0%), and no SVE Couplets or SVE Triplets were present. Isolated VEs  were rare (<1.0%), and no VE Couplets or VE Triplets were present. Difficulty discerning atrial activity making definitive diagnosis difficult to ascertain.  HISTORY Copied Dr. Derry Lory 09/01/21 H & P: Shawn Austin is a 57 y.o. male with PMH significant for DM2, HTN, HLD, degenerative spinal disc disease, who presents with leaning to the left and eyes deviated to the right and feeling off balance with falls over the last few days.   Patient reports that he went to bed on Saturday around 2100 and woke up Sunday AM and felt like he was walking to his left when going to the bathroom.  He reports that in the afternoon, he fell while attempting to walk.  He reports that he has had a  couple falls since then including in the morning today.  He reports that he felt that his speech was slightly slurred on Sunday.  He reports that today in the afternoon, his sensation of disequilibrium has worsened, his speech is more slurry and he feels very incoordinated.   LKW: 2100 on 08/30/2021. mRS: 0 tNKASE: Not offered due to patient outside  window. Thrombectomy: Not offered due to low suspicion for an LVO.   Update 03/10/2022 : He returns for follow-up today after last visit 6 months ago with Shawn Austin, Shawn Austin.  He is accompanied by his wife.  He has had no recurrent stroke or TIA symptoms.  He has noticed improvement in his speech and swallowing as well as gait and balance.  He is able to walk with a walker but without it he is quite ataxic and leans to the right.  He is careful but needs some help to get up from the chair.  He voices also gets quite hypophonic and slurred when he is tired.  He remains on aspirin and Plavix which is tolerating well without bruising or bleeding.  His blood pressure is usually under better control and today he was quite anxious and nervous for this visit and it is elevated at 163.  Remains on Lipitor but is tolerating well without side effects.  His sugars are under good control.  He has an upcoming visit with follow-up labs were checked.  He has no new complaints today.  REVIEW OF SYSTEMS: Out of a complete 14 system review of symptoms, the patient complains only of the following symptoms, and all other reviewed systems are negative.  See HPI  ALLERGIES: No Known Allergies  HOME MEDICATIONS: Outpatient Medications Prior to Visit  Medication Sig Dispense Refill   aspirin EC 325 MG tablet Take 1 tablet (325 mg total) by mouth daily. 90 tablet 3   atorvastatin (LIPITOR) 80 MG tablet Take 1 tablet (80 mg total) by mouth daily. 30 tablet 6   bethanechol (URECHOLINE) 25 MG tablet Take 1 tablet (25 mg total) by mouth 3 (three) times daily. 90 tablet 0   carvedilol (COREG) 3.125 MG tablet Take 1 tablet (3.125 mg total) by mouth 2 (two) times daily with a meal. 60 tablet 3   chlorproMAZINE (THORAZINE) 25 MG tablet Take 0.5 tablets (12.5 mg total) by mouth 2 (two) times daily. 30 tablet 0   clopidogrel (PLAVIX) 75 MG tablet Take 1 tablet (75 mg total) by mouth daily. (Take aspirin 325 mg daily and clopidogrel  daily for 3 months after which, patient will continue aspirin 325 mg alone) 30 tablet 0   losartan (COZAAR) 100 MG tablet Take 1 tablet (100 mg total) by mouth at bedtime. 30 tablet 6   metFORMIN (GLUCOPHAGE) 500 MG tablet Take 1 tablet (500 mg total) by mouth daily with breakfast. DOSE CHANGE 60 tablet 3   senna-docusate (SENOKOT-S) 8.6-50 MG tablet Take 2 tablets by mouth daily. 60 tablet 0   tamsulosin (FLOMAX) 0.4 MG CAPS capsule Take 2 capsules (0.8 mg total) by mouth daily after supper. 60 capsule 0   No facility-administered medications prior to visit.    PAST MEDICAL HISTORY: Past Medical History:  Diagnosis Date   Anemia    Articular cartilage disorder of hip    Cerebral ischemia    Congenital deformity of right hip joint    Degeneration of cervical intervertebral disc    Diabetes mellitus without complication (HCC)    type 2   Fatigue  Hyperglycemia due to diabetes mellitus (HCC)    Hyperlipidemia    Hypertension    Hypocalcemia    Hyponatremia    Lower limb pain, inferior, right    Mild cognitive disorder    Pain in joint of left knee    Recurrent falls    Spondylolisthesis of lumbar region    Spondylolisthesis, lumbar region     PAST SURGICAL HISTORY: Past Surgical History:  Procedure Laterality Date   APPENDECTOMY  1970   BUBBLE STUDY  09/11/2021   Procedure: BUBBLE STUDY;  Surgeon: Shawn Stade, MD;  Location: [facility] ENDOSCOPY;  Service: Cardiovascular;;   TEE WITHOUT CARDIOVERSION N/A 09/11/2021   Procedure: TRANSESOPHAGEAL ECHOCARDIOGRAM (TEE);  Surgeon: Shawn Stade, MD;  Location: [facility] ENDOSCOPY;  Service: Cardiovascular;  Laterality: N/A;    FAMILY HISTORY: Family History  Problem Relation Age of Onset   Cancer Mother    Glaucoma Father     SOCIAL HISTORY: Social History   Socioeconomic History   Marital status: Single    Spouse name: Not on file   Number of children: Not on file   Years of education: Not on file   Highest education level:  Not on file  Occupational History   Not on file  Tobacco Use   Smoking status: Never   Smokeless tobacco: Not on file  Vaping Use   Vaping Use: Not on file  Substance and Sexual Activity   Alcohol use: No   Drug use: No   Sexual activity: Not Currently  Other Topics Concern   Not on file  Social History Narrative   Not on file   Social Determinants of Health   Financial Resource Strain: Not on file  Food Insecurity: Not on file  Transportation Needs: Not on file  Physical Activity: Not on file  Stress: Not on file  Social Connections: Not on file  Intimate Partner Violence: Not on file    PHYSICAL EXAM  Vitals:   03/10/22 1403  BP: (!) 163/76  Pulse: 80   There is no height or weight on file to calculate BMI.  Generalized: Well developed pleasant middle-aged African-American male, in no acute distress, sitting in wheelchair, appears irritable, disinterested Neurological examination  Mentation: Alert oriented to time, place, history mostly provided by his significant other.  Follows all commands, slight dysarthria with hypophonic speech, few times word finding trouble with frustration. Cranial nerve II-XII: Pupils were equal round reactive to light. Extraocular movements were full but with saccadic dysmetria right greater than left lateral gaze.  No nystagmus., visual field were full on confrontational test on exam but have noted left eye incomplete abduction. Facial sensation and strength were normal. Decreased left shoulder shrug. Subtle left-sided facial droop.  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 Motor: Good strength to upper and lower extremities.  When talking, tends to have head rolling. Sensory: Sensory testing is intact to soft touch on all 4 extremities. No evidence of extinction is noted.  Coordination: Slight dysmetria with finger-nose-finger bilaterally, and with heel-to-shin with the left Gait and  station: Has to push off from seated position to stand, gait is unsteady and broad-based with 2 person assist, tends to lean towards the right, the left leg is straight, has circumduction type swing Reflexes: Deep tendon reflexes are symmetric and normal bilaterally.   DIAGNOSTIC DATA (LABS, IMAGING, TESTING) - I reviewed patient records, labs, notes, testing and imaging myself where available.  Lab Results  Component Value Date   WBC 3.5 11/04/2021   HGB 11.7 (L) 11/04/2021   HCT 35.0 (L) 11/04/2021   MCV 83 11/04/2021   PLT 500 (H) 11/04/2021      Component Value Date/Time   NA 137 10/07/2021 0554   K 4.1 10/07/2021 0554   CL 103 10/07/2021 0554   CO2 23 10/07/2021 0554   GLUCOSE 131 (H) 10/07/2021 0554   BUN 7 10/07/2021 0554   CREATININE 0.99 10/07/2021 0554   CREATININE 0.78 05/19/2012 2033   CALCIUM 9.1 10/07/2021 0554   PROT 6.7 09/11/2021 1846   ALBUMIN 3.2 (L) 09/11/2021 1846   AST 22 09/11/2021 1846   ALT 20 09/11/2021 1846   ALKPHOS 88 09/11/2021 1846   BILITOT 0.7 09/11/2021 1846   GFRNONAA >60 10/07/2021 0554   Lab Results  Component Value Date   CHOL 232 (H) 09/02/2021   HDL 50 09/02/2021   LDLCALC 169 (H) 09/02/2021   TRIG 65 09/02/2021   CHOLHDL 4.6 09/02/2021   Lab Results  Component Value Date   HGBA1C 8.1 (H) 09/02/2021   No results found for: "VITAMINB12" Lab Results  Component Value Date   TSH 0.984 04/14/2012    IMPRESSION : 57 year old African-American male with right medullary infarct in July 2023 followed by right MCA embolic infarcts 2 weeks later cryptogenic etiology.  Vascular risk factors for diabetes, hypertension and hyperlipidemia.  Patient still has significant residual gait ataxia Plan ;  I had a long d/w patient and his wife about his remote stroke,residual gait ataxia, risk for recurrent stroke/TIAs, personally independently reviewed imaging studies and stroke evaluation results and answered questions.Continue aspirin 81 mg daily   but stop Plavix now for secondary stroke prevention and maintain strict control of hypertension with blood pressure goal below 130/90, diabetes with hemoglobin A1c goal below 6.5% and lipids with LDL cholesterol goal below 70 mg/dL. I also advised the patient to eat a healthy diet with plenty of whole grains, cereals, fruits and vegetables, exercise regularly and maintain ideal body weight.  I advised him to use a walker at all times and we discussed fall prevention precautions.  Followup in the future with stated nurse practitioner in 6 months or call if necessary.  Greater than 50% time during this 35-minute visit was spent on counseling and coordination of care about his stroke/embolic prevention and treatment Antony Contras, MD . [facility] [address], [place], [place] [address] [phone]

## 2022-03-11 ENCOUNTER — Other Ambulatory Visit: Payer: Self-pay | Admitting: Physical Medicine and Rehabilitation

## 2022-03-19 ENCOUNTER — Other Ambulatory Visit (HOSPITAL_COMMUNITY): Payer: Self-pay

## 2022-03-24 ENCOUNTER — Ambulatory Visit: Payer: Self-pay | Admitting: Nurse Practitioner

## 2022-04-22 ENCOUNTER — Ambulatory Visit: Payer: Medicaid Other | Admitting: Physician Assistant

## 2022-05-07 ENCOUNTER — Ambulatory Visit: Payer: Medicaid Other | Admitting: Physician Assistant

## 2022-05-21 ENCOUNTER — Ambulatory Visit: Payer: Medicaid Other | Attending: Physician Assistant | Admitting: Physician Assistant

## 2022-05-21 VITALS — BP 127/81 | HR 88

## 2022-05-21 DIAGNOSIS — I10 Essential (primary) hypertension: Secondary | ICD-10-CM | POA: Diagnosis not present

## 2022-05-21 DIAGNOSIS — R414 Neurologic neglect syndrome: Secondary | ICD-10-CM | POA: Diagnosis not present

## 2022-05-21 DIAGNOSIS — I639 Cerebral infarction, unspecified: Secondary | ICD-10-CM

## 2022-05-21 DIAGNOSIS — R5381 Other malaise: Secondary | ICD-10-CM | POA: Diagnosis not present

## 2022-05-21 DIAGNOSIS — D649 Anemia, unspecified: Secondary | ICD-10-CM | POA: Diagnosis not present

## 2022-05-21 DIAGNOSIS — E1165 Type 2 diabetes mellitus with hyperglycemia: Secondary | ICD-10-CM | POA: Diagnosis not present

## 2022-05-21 DIAGNOSIS — N39490 Overflow incontinence: Secondary | ICD-10-CM | POA: Diagnosis not present

## 2022-05-21 LAB — GLUCOSE, POCT (MANUAL RESULT ENTRY): POC Glucose: 151 mg/dl — AB (ref 70–99)

## 2022-05-21 LAB — POCT GLYCOSYLATED HEMOGLOBIN (HGB A1C): HbA1c, POC (controlled diabetic range): 7.1 % — AB (ref 0.0–7.0)

## 2022-05-21 MED ORDER — METFORMIN HCL 500 MG PO TABS: 1000.0000 mg | ORAL_TABLET | Freq: Every day | ORAL | 1 refills | Status: AC

## 2022-05-21 MED ORDER — TAMSULOSIN HCL 0.4 MG PO CAPS: 0.8000 mg | ORAL_CAPSULE | Freq: Every day | ORAL | 1 refills | Status: AC

## 2022-05-21 MED ORDER — ATORVASTATIN CALCIUM 80 MG PO TABS: 80.0000 mg | ORAL_TABLET | Freq: Every day | ORAL | 1 refills | Status: AC

## 2022-05-21 MED ORDER — BETHANECHOL CHLORIDE 25 MG PO TABS: 25.0000 mg | ORAL_TABLET | Freq: Three times a day (TID) | ORAL | 1 refills | Status: DC

## 2022-05-21 MED ORDER — ASPIRIN 325 MG PO TBEC: 325.0000 mg | DELAYED_RELEASE_TABLET | Freq: Every day | ORAL | 3 refills | Status: AC

## 2022-05-21 MED ORDER — LOSARTAN POTASSIUM 100 MG PO TABS: 100.0000 mg | ORAL_TABLET | Freq: Every day | ORAL | 1 refills | Status: AC

## 2022-05-21 MED ORDER — CARVEDILOL 3.125 MG PO TABS: 3.1250 mg | ORAL_TABLET | Freq: Two times a day (BID) | ORAL | 1 refills | Status: AC

## 2022-05-21 NOTE — Progress Notes (Signed)
Shawn Austin, is a 57 y.o. male  [id number]  [id number]  DOB - [birth year]  Chief Complaint  Patient presents with   Diabetes       Subjective:   Shawn Austin is a 57 y.o. male here today for med RF and to request PCS.  Initial ischemic stroke 09/01/2021 then subsequent event.  At first, his wife was working from home and able to provide most of his care but now she has had to go back into the office.  They are requesting help with ADL and physical strengthening.  His tendency is to sleep/rest while is wife is at work during the day and he has not been trying to do home exercise or keeping up with strengthening.  He can get around some but mostly uses wheelchair when he leaves the house.  He is able to ambulate some with assistance/with a walker at home.  He also will sit on the steps and go down the stairs when his wife is not home.  He needs help with safe activity, some help with dressing, bathing, and help with meal preparation.  He is compliant with his medications   No problems updated.  ALLERGIES: No Known Allergies  PAST MEDICAL HISTORY: Past Medical History:  Diagnosis Date   Anemia    Articular cartilage disorder of hip    Cerebral ischemia    Congenital deformity of right hip joint    Degeneration of cervical intervertebral disc    Diabetes mellitus without complication (HCC)    type 2   Fatigue    Hyperglycemia due to diabetes mellitus (HCC)    Hyperlipidemia    Hypertension    Hypocalcemia    Hyponatremia    Lower limb pain, inferior, right    Mild cognitive disorder    Pain in joint of left knee    Recurrent falls    Spondylolisthesis of lumbar region    Spondylolisthesis, lumbar region     MEDICATIONS AT HOME: Prior to Admission medications   Medication Sig Start Date End Date Taking? Authorizing Provider  chlorproMAZINE (THORAZINE) 25 MG tablet Take 0.5 tablets (12.5 mg total) by mouth 2 (two) times daily. 10/10/21  Yes Love, Ivan Anchors, PA-C   senna-docusate (SENOKOT-S) 8.6-50 MG tablet Take 2 tablets by mouth daily. 10/11/21  Yes Love, Ivan Anchors, PA-C  aspirin EC 325 MG tablet Take 1 tablet (325 mg total) by mouth daily. 05/21/22   Argentina Donovan, PA-C  atorvastatin (LIPITOR) 80 MG tablet Take 1 tablet (80 mg total) by mouth daily. 05/21/22   Argentina Donovan, PA-C  bethanechol (URECHOLINE) 25 MG tablet Take 1 tablet (25 mg total) by mouth 3 (three) times daily. 05/21/22   Argentina Donovan, PA-C  carvedilol (COREG) 3.125 MG tablet Take 1 tablet (3.125 mg total) by mouth 2 (two) times daily with a meal. 05/21/22   Deshae Dickison, Dionne Bucy, PA-C  losartan (COZAAR) 100 MG tablet Take 1 tablet (100 mg total) by mouth at bedtime. 05/21/22   Argentina Donovan, PA-C  metFORMIN (GLUCOPHAGE) 500 MG tablet Take 2 tablets (1,000 mg total) by mouth daily with breakfast. DOSE CHANGE 05/21/22   Argentina Donovan, PA-C  tamsulosin (FLOMAX) 0.4 MG CAPS capsule Take 2 capsules (0.8 mg total) by mouth daily after supper. 05/21/22   Quadry Kampa, Dionne Bucy, PA-C    ROS: Neg HEENT Neg resp Neg cardiac Neg GI Neg GU Neg MS Neg psych Neg neuro  Objective:   Vitals:   05/21/22 1353  BP: 127/81  Pulse: 88  SpO2: 100%   Exam General appearance : Awake, alert, not in any distress. Speech Clear. Not toxic looking HEENT: Atraumatic and Normocephalic Neck: Supple, no JVD. No cervical lymphadenopathy.  Chest: Good air entry bilaterally, CTAB.  No rales/rhonchi/wheezing CVS: S1 S2 regular, no murmurs.  Extremities: B/L Lower Ext shows no edema, both legs are warm to touch Neurology: Awake alert, and oriented X 3, some abnormal head movements and L sided weakness/L sided decreased ROM Skin: No Rash  Data Review Lab Results  Component Value Date   HGBA1C 7.1 (A) 05/21/2022   HGBA1C 8.1 (H) 09/02/2021   HGBA1C 11.2 06/27/2012    Assessment & Plan   1. Type 2 diabetes mellitus with hyperglycemia, without long-term current use of insulin Improved but will  increase metformin to 500 bid.  GFR preserved.   - Glucose (CBG) - HgB A1c - Comprehensive metabolic panel - atorvastatin (LIPITOR) 80 MG tablet; Take 1 tablet (80 mg total) by mouth daily.  Dispense: 90 tablet; Refill: 1 - metFORMIN (GLUCOPHAGE) 500 MG tablet; Take 2 tablets (1,000 mg total) by mouth daily with breakfast. DOSE CHANGE  Dispense: 180 tablet; Refill: 1  2. Primary hypertension controlled - Comprehensive metabolic panel - losartan (COZAAR) 100 MG tablet; Take 1 tablet (100 mg total) by mouth at bedtime.  Dispense: 90 tablet; Refill: 1 - carvedilol (COREG) 3.125 MG tablet; Take 1 tablet (3.125 mg total) by mouth 2 (two) times daily with a meal.  Dispense: 180 tablet; Refill: 1  3. Anemia, unspecified type - CBC with Differential/Platelet  4. S/p multiple Acute ischemic stroke  - Lipid panel - atorvastatin (LIPITOR) 80 MG tablet; Take 1 tablet (80 mg total) by mouth daily.  Dispense: 90 tablet; Refill: 1 - aspirin EC 325 MG tablet; Take 1 tablet (325 mg total) by mouth daily.  Dispense: 90 tablet; Refill: 3  5. Overflow incontinence of urine - bethanechol (URECHOLINE) 25 MG tablet; Take 1 tablet (25 mg total) by mouth 3 (three) times daily.  Dispense: 90 tablet; Refill: 1 - tamsulosin (FLOMAX) 0.4 MG CAPS capsule; Take 2 capsules (0.8 mg total) by mouth daily after supper.  Dispense: 180 capsule; Refill: 1  6. 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 Cryptogenic stroke - Ambulatory referral to Home Health  8. Physical debility - Ambulatory referral to [facility] care services    Return in about 3 months (around 08/20/2022) for PCP for chronic conditions and monitor progress.  .  The patient was given clear instructions to go to ER or return to medical center if symptoms don't improve, worsen or new problems develop. The patient verbalized understanding. The patient was told to call to get lab results if they  haven't heard anything in the next week.      Freeman Caldron, PA-C [facility], [place]   05/21/2022, 2:17 PMPatient ID: Shawn Austin, male   DOB: [birth year], 57 y.o.   MRN: [id number]

## 2022-05-22 LAB — COMPREHENSIVE METABOLIC PANEL
ALT: 12 IU/L (ref 0–44)
AST: 16 IU/L (ref 0–40)
Albumin/Globulin Ratio: 1.3 (ref 1.2–2.2)
Albumin: 3.9 g/dL (ref 3.8–4.9)
Alkaline Phosphatase: 121 IU/L (ref 44–121)
BUN/Creatinine Ratio: 16 (ref 9–20)
BUN: 21 mg/dL (ref 6–24)
Bilirubin Total: 0.4 mg/dL (ref 0.0–1.2)
CO2: 23 mmol/L (ref 20–29)
Calcium: 9.2 mg/dL (ref 8.7–10.2)
Chloride: 104 mmol/L (ref 96–106)
Creatinine, Ser: 1.32 mg/dL — ABNORMAL HIGH (ref 0.76–1.27)
Globulin, Total: 3 g/dL (ref 1.5–4.5)
Glucose: 153 mg/dL — ABNORMAL HIGH (ref 70–99)
Potassium: 4.4 mmol/L (ref 3.5–5.2)
Sodium: 140 mmol/L (ref 134–144)
Total Protein: 6.9 g/dL (ref 6.0–8.5)
eGFR: 63 mL/min/{1.73_m2} (ref >59)

## 2022-05-22 LAB — CBC WITH DIFFERENTIAL/PLATELET
Basophils Absolute: 0 10*3/uL (ref 0.0–0.2)
Basos: 1 %
EOS (ABSOLUTE): 0.1 10*3/uL (ref 0.0–0.4)
Eos: 2 %
Hematocrit: 37.1 % — ABNORMAL LOW (ref 37.5–51.0)
Hemoglobin: 12.4 g/dL — ABNORMAL LOW (ref 13.0–17.7)
Immature Grans (Abs): 0 10*3/uL (ref 0.0–0.1)
Immature Granulocytes: 0 %
Lymphocytes Absolute: 2 10*3/uL (ref 0.7–3.1)
Lymphs: 38 %
MCH: 27.2 pg (ref 26.6–33.0)
MCHC: 33.4 g/dL (ref 31.5–35.7)
MCV: 81 fL (ref 79–97)
Monocytes Absolute: 0.5 10*3/uL (ref 0.1–0.9)
Monocytes: 9 %
Neutrophils Absolute: 2.7 10*3/uL (ref 1.4–7.0)
Neutrophils: 50 %
Platelets: 383 10*3/uL (ref 150–450)
RBC: 4.56 x10E6/uL (ref 4.14–5.80)
RDW: 13.8 % (ref 11.6–15.4)
WBC: 5.3 10*3/uL (ref 3.4–10.8)

## 2022-05-22 LAB — LIPID PANEL
Chol/HDL Ratio: 3.3 ratio (ref 0.0–5.0)
Cholesterol, Total: 170 mg/dL (ref 100–199)
HDL: 51 mg/dL (ref >39)
LDL Chol Calc (NIH): 104 mg/dL — ABNORMAL HIGH (ref 0–99)
Triglycerides: 81 mg/dL (ref 0–149)
VLDL Cholesterol Cal: 15 mg/dL (ref 5–40)

## 2022-05-28 ENCOUNTER — Telehealth: Payer: Self-pay

## 2022-05-28 NOTE — Telephone Encounter (Addendum)
Referral received for PCS and home health PT. I called patient's fiance, [name] to discuss this request.  She said that the patient would benefit most from the personal care assistance.  I explained we can submit the referral for PCS to his insurance company and they will have a nurse come to the home and assess him and determine the number of hours/ days that he would be eligible for.  Regarding PT, she said it could be at home or outpatient. I explained that there is no guarantee that an agency will accept the home health referral. The agency needs to be in network with his insurance and have staff available to provide the services.  [name] said she would prefer that they start with PCS to get him on a schedule and hold off on PT for now.  I told her to please let us know if they decide on PT and we can place another referral for home or outpatient PT. She said she understood and was very Adult nurse.   Completed PCS faxed to [facility] CAC: [phone]

## 2022-06-01 NOTE — Telephone Encounter (Signed)
noted 

## 2022-06-06 ENCOUNTER — Other Ambulatory Visit: Payer: Self-pay | Admitting: Physician Assistant

## 2022-06-06 DIAGNOSIS — N39490 Overflow incontinence: Secondary | ICD-10-CM

## 2022-06-08 NOTE — Telephone Encounter (Signed)
Requested medication (s) are due for refill today: No  Requested medication (s) are on the active medication list: Yes  Last refill:  05/21/22  Future visit scheduled: Yes  Notes to clinic:  Pharmacy requests 90 day supply.    Requested Prescriptions  Pending Prescriptions Disp Refills   bethanechol (URECHOLINE) 25 MG tablet [Pharmacy Med Name: BETHANECHOL 25 MG TABLET] 270 tablet 1    Sig: TAKE 1 TABLET BY MOUTH THREE TIMES A DAY     Urology:  Bladder Agents Passed - 06/06/2022 10:40 AM      Passed - Valid encounter within last 12 months    Recent Outpatient Visits           2 weeks ago Type 2 diabetes mellitus with hyperglycemia, without long-term current use of insulin   [place] [facility] [place], [place], [place]   7 months ago Encounter to establish care   [facility] [place], [place] W, NP   4 years ago Uncontrolled hypertension   [facility] [place], [place], [place]   9 years ago Type II or unspecified type diabetes mellitus without mention of complication, not stated as uncontrolled   [facility] at [facility], Myrle Sheng, MD   9 years ago Type II or unspecified type diabetes mellitus without mention of complication, uncontrolled   [facility] at [facility], Myrle Sheng, MD       Future Appointments             In 2 months Claiborne Rigg, NP [facility]

## 2022-08-26 ENCOUNTER — Ambulatory Visit: Payer: Medicaid Other | Admitting: Nurse Practitioner

## 2022-09-24 ENCOUNTER — Telehealth: Payer: Self-pay | Admitting: Neurology

## 2022-09-24 ENCOUNTER — Ambulatory Visit: Payer: Medicaid Other | Admitting: Neurology

## 2022-09-24 NOTE — Telephone Encounter (Signed)
LVM and sent text msg informing pt of need to reschedule 09/24/22 appt - inclement weather

## 2022-12-18 DEATH — deceased

## 2023-08-15 IMAGING — MR MR HIP*R* W/O CM
6 series · 37 of 40 positions shown · non-contrast
Comparison: None.

CLINICAL DATA: Right hip pain

EXAM:
MR OF THE RIGHT HIP WITHOUT CONTRAST
TECHNIQUE: Multiplanar, multisequence MR imaging was performed. No intravenous
contrast was administered.

[Series 4: T2 fat-sat · coronal · 4.0mm · 1.19mm/px · 6 of 24 slices shown (1 of 2)]
[im 1/24]
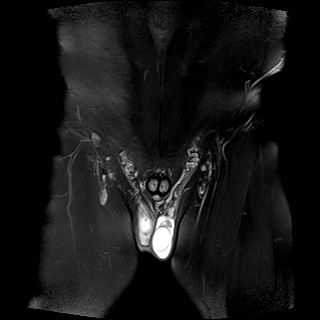
[im 5/24]
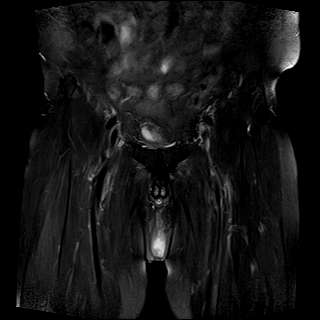
[im 10/24]
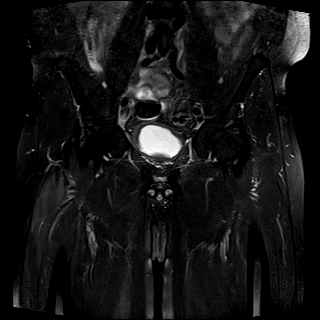
[im 14/24]
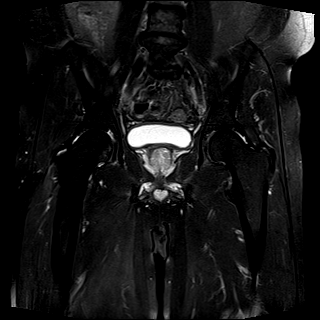
[im 19/24]
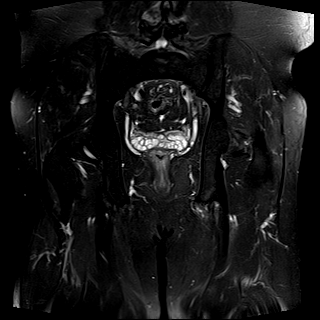
[im 24/24]
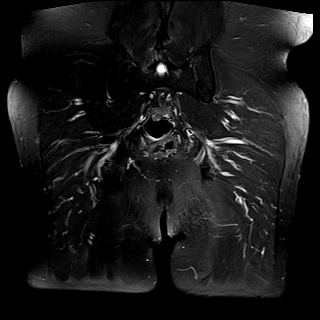

[Series 5: T2 fat-sat · axial · 4.0mm · 0.62mm/px · z∈[-49,+85]mm · 8 of 29 slices shown (2 of 2)]
[im 1/29]
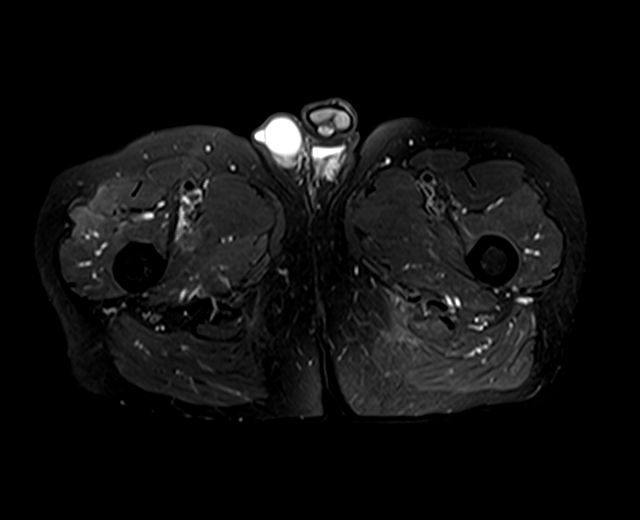
[im 5/29]
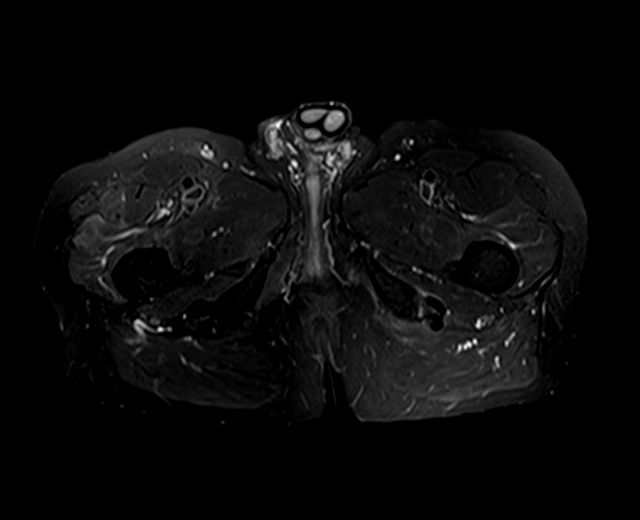
[im 9/29]
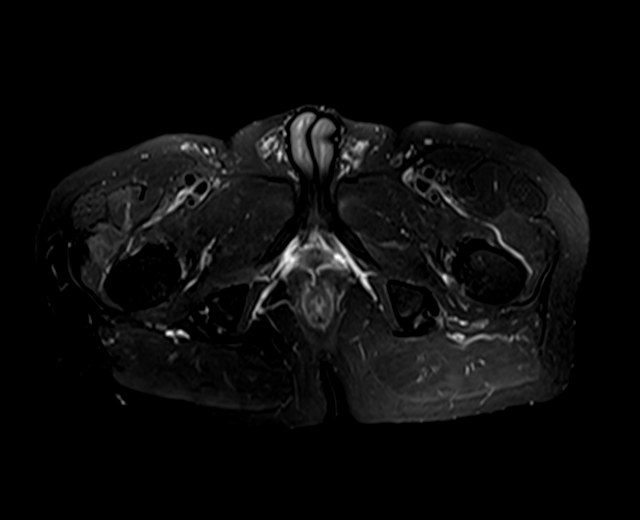
[im 13/29]
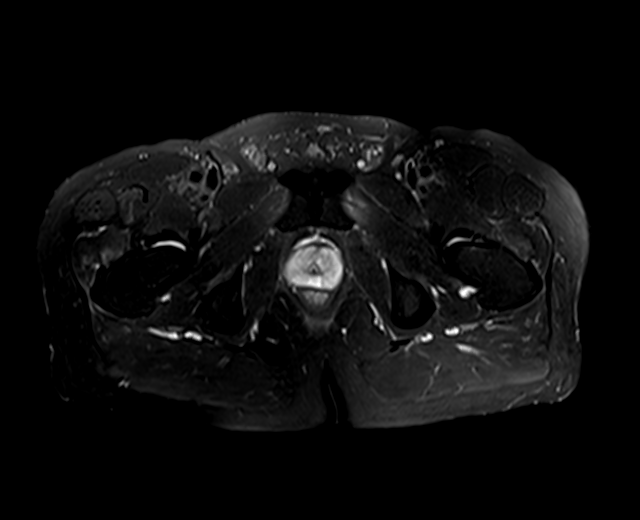
[im 17/29]
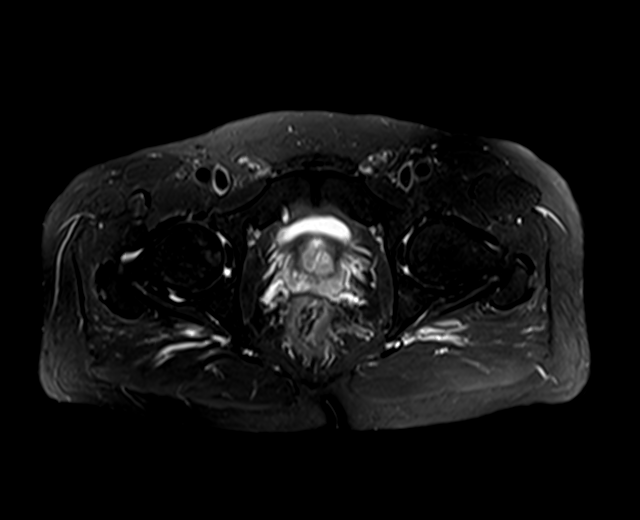
[im 21/29]
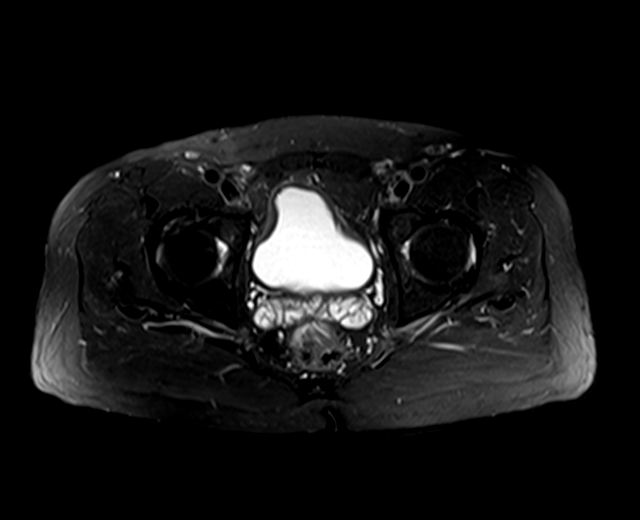
[im 25/29]
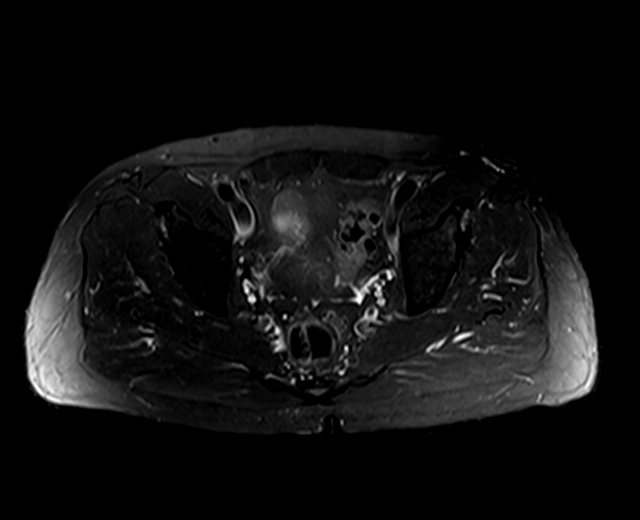
[im 29/29]
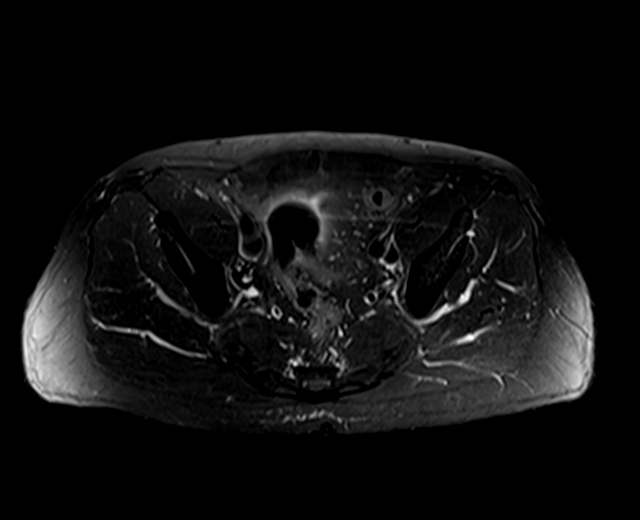

[Series 6: PD fat-sat · sagittal · 4.0mm · 0.70mm/px · 7 of 24 slices shown (1 of 3)]
[im 1/24]
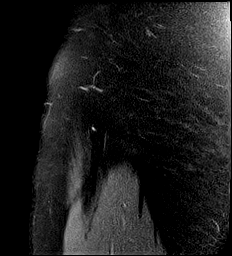
[im 4/24]
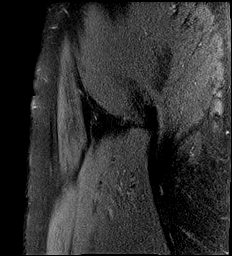
[im 8/24]
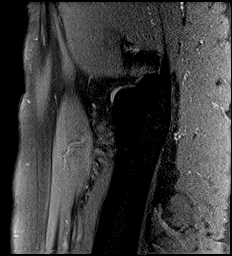
[im 12/24]
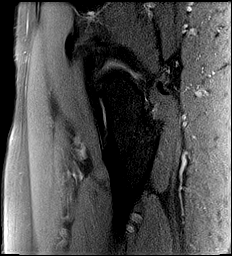
[im 16/24]
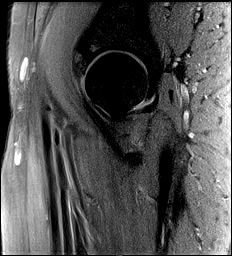
[im 20/24]
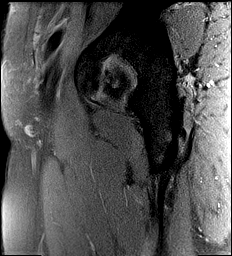
[im 24/24]
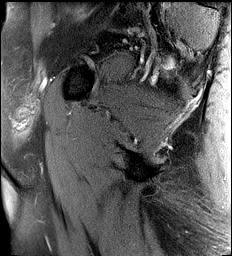

[Series 7: PD fat-sat · oblique · 4.0mm · 0.70mm/px · 7 of 24 slices shown (2 of 3)]
[im 1/24]
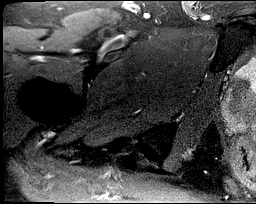
[im 4/24]
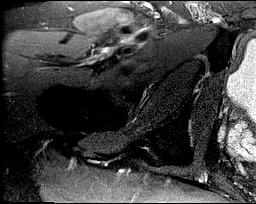
[im 8/24]
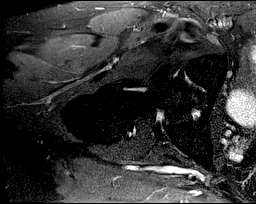
[im 12/24]
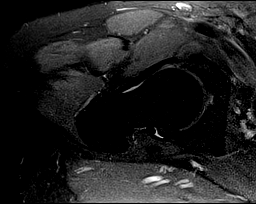
[im 16/24]
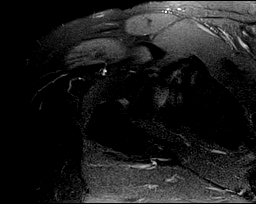
[im 20/24]
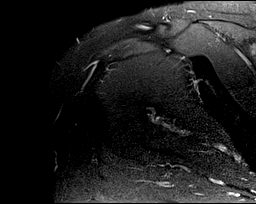
[im 24/24]
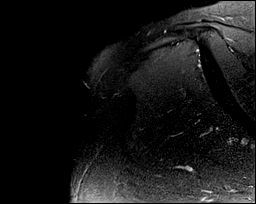

[Series 8: PD fat-sat · coronal · 4.0mm · 0.70mm/px · 5 of 19 slices shown (3 of 3)]
[im 1/19]
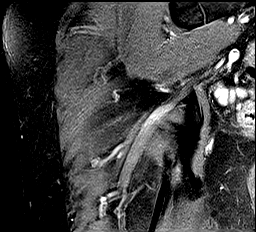
[im 5/19]
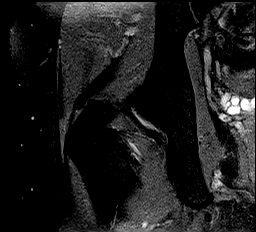
[im 10/19]
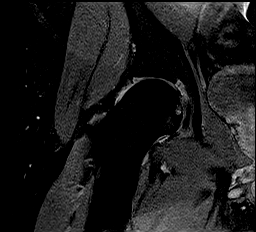
[im 14/19]
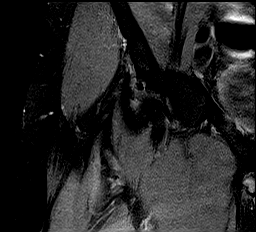
[im 19/19]
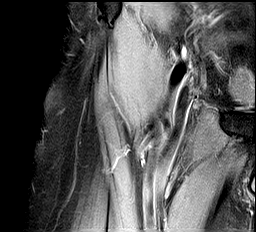

[Series 9: T1 · coronal · 4.0mm · 1.19mm/px · 4 of 24 slices shown]
[im 1/24]
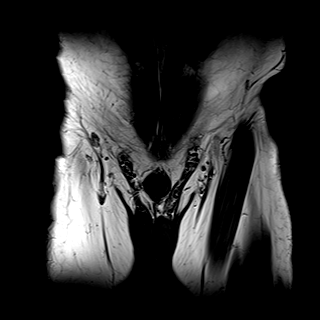
[im 4/24]
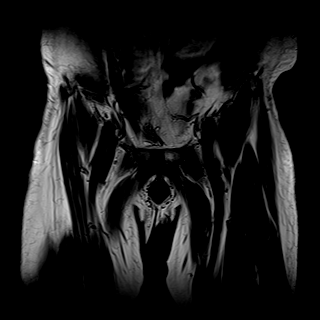
[im 8/24]
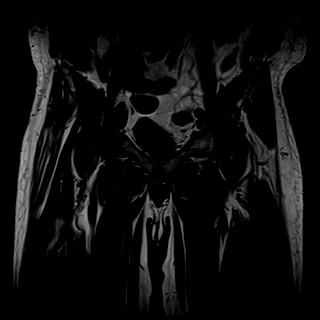
[im 12/24]
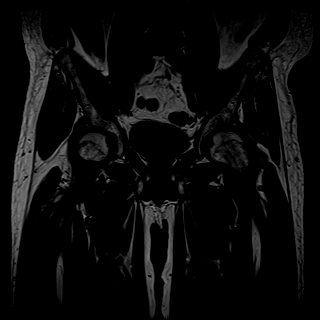

[37 of 40 positions shown; findings below may reference images not displayed]

FINDINGS: Bones: There is no evidence of acute fracture, dislocation or
avascular necrosis. No focal bone lesion. The visualized sacroiliac
joints and symphysis pubis appear normal.

Articular cartilage and labrum

Articular cartilage:  Mild chondrosis.

Labrum: Degenerative superior and anterior superior labral tearing.

Joint or bursal effusion

Joint effusion: No significant hip joint effusion.

Bursae: No focal periarticular fluid collection.

Muscles and tendons

Muscles and tendons: Mild tendinosis of the distal gluteus minimus
tendon on the right. Mild intramuscular edema within the proximal
vastus lateralis muscle on the right. There is moderate tendinosis
of the proximal left hamstrings. There is minimal intramuscular
edema within the proximal adductors. No significant muscle atrophy.

Other findings

Miscellaneous: The visualized internal pelvic contents appear
unremarkable.
IMPRESSION: Mild right hip osteoarthritis with degenerative superior and
anterior superior labral tearing.

Mild tendinosis of the right distal gluteus minimus tendon.

Low-grade muscle strain of the proximal right vastus lateralis
muscle and proximal adductor muscles bilaterally.

Moderate tendinosis of the proximal left hamstring tendons.
